# Patient Record
Sex: Female | Born: 1970 | Race: White | Hispanic: No | Marital: Married | Smoking: Never smoker
Health system: Southern US, Community
[De-identification: ages and names within clinical notes are randomized; demographics above are authoritative.]

## PROBLEM LIST (undated history)

## (undated) DIAGNOSIS — N926 Irregular menstruation, unspecified: Secondary | ICD-10-CM

## (undated) DIAGNOSIS — Z8719 Personal history of other diseases of the digestive system: Secondary | ICD-10-CM

## (undated) DIAGNOSIS — N200 Calculus of kidney: Secondary | ICD-10-CM

## (undated) DIAGNOSIS — T7840XA Allergy, unspecified, initial encounter: Secondary | ICD-10-CM

## (undated) DIAGNOSIS — E663 Overweight: Secondary | ICD-10-CM

## (undated) DIAGNOSIS — F329 Major depressive disorder, single episode, unspecified: Secondary | ICD-10-CM

## (undated) DIAGNOSIS — K469 Unspecified abdominal hernia without obstruction or gangrene: Secondary | ICD-10-CM

## (undated) DIAGNOSIS — J45909 Unspecified asthma, uncomplicated: Secondary | ICD-10-CM

## (undated) DIAGNOSIS — R5383 Other fatigue: Secondary | ICD-10-CM

## (undated) DIAGNOSIS — L0390 Cellulitis, unspecified: Secondary | ICD-10-CM

## (undated) DIAGNOSIS — N189 Chronic kidney disease, unspecified: Secondary | ICD-10-CM

## (undated) DIAGNOSIS — Z8616 Personal history of COVID-19: Secondary | ICD-10-CM

## (undated) DIAGNOSIS — M1990 Unspecified osteoarthritis, unspecified site: Secondary | ICD-10-CM

## (undated) DIAGNOSIS — D649 Anemia, unspecified: Secondary | ICD-10-CM

## (undated) DIAGNOSIS — N83209 Unspecified ovarian cyst, unspecified side: Secondary | ICD-10-CM

## (undated) DIAGNOSIS — M549 Dorsalgia, unspecified: Secondary | ICD-10-CM

## (undated) DIAGNOSIS — K429 Umbilical hernia without obstruction or gangrene: Secondary | ICD-10-CM

## (undated) DIAGNOSIS — G4700 Insomnia, unspecified: Secondary | ICD-10-CM

## (undated) DIAGNOSIS — F32A Depression, unspecified: Secondary | ICD-10-CM

## (undated) DIAGNOSIS — M6208 Separation of muscle (nontraumatic), other site: Secondary | ICD-10-CM

## (undated) DIAGNOSIS — G8929 Other chronic pain: Secondary | ICD-10-CM

## (undated) DIAGNOSIS — Z87442 Personal history of urinary calculi: Secondary | ICD-10-CM

## (undated) DIAGNOSIS — M25551 Pain in right hip: Secondary | ICD-10-CM

## (undated) DIAGNOSIS — N719 Inflammatory disease of uterus, unspecified: Secondary | ICD-10-CM

## (undated) DIAGNOSIS — R519 Headache, unspecified: Secondary | ICD-10-CM

## (undated) DIAGNOSIS — R51 Headache: Secondary | ICD-10-CM

## (undated) DIAGNOSIS — B379 Candidiasis, unspecified: Secondary | ICD-10-CM

## (undated) DIAGNOSIS — J45990 Exercise induced bronchospasm: Secondary | ICD-10-CM

## (undated) DIAGNOSIS — L309 Dermatitis, unspecified: Secondary | ICD-10-CM

## (undated) DIAGNOSIS — Z8619 Personal history of other infectious and parasitic diseases: Secondary | ICD-10-CM

## (undated) DIAGNOSIS — J189 Pneumonia, unspecified organism: Secondary | ICD-10-CM

## (undated) HISTORY — DX: Inflammatory disease of uterus, unspecified: N71.9

## (undated) HISTORY — DX: Calculus of kidney: N20.0

## (undated) HISTORY — DX: Overweight: E66.3

## (undated) HISTORY — DX: Unspecified abdominal hernia without obstruction or gangrene: K46.9

## (undated) HISTORY — DX: Pain in right hip: M25.551

## (undated) HISTORY — DX: Allergy, unspecified, initial encounter: T78.40XA

## (undated) HISTORY — DX: Irregular menstruation, unspecified: N92.6

## (undated) HISTORY — DX: Chronic kidney disease, unspecified: N18.9

## (undated) HISTORY — DX: Unspecified osteoarthritis, unspecified site: M19.90

## (undated) HISTORY — DX: Depression, unspecified: F32.A

## (undated) HISTORY — DX: Headache, unspecified: R51.9

## (undated) HISTORY — DX: Insomnia, unspecified: G47.00

## (undated) HISTORY — DX: Personal history of other infectious and parasitic diseases: Z86.19

## (undated) HISTORY — DX: Dorsalgia, unspecified: M54.9

## (undated) HISTORY — DX: Dermatitis, unspecified: L30.9

## (undated) HISTORY — DX: Anemia, unspecified: D64.9

## (undated) HISTORY — DX: Candidiasis, unspecified: B37.9

## (undated) HISTORY — DX: Pneumonia, unspecified organism: J18.9

## (undated) HISTORY — DX: Other fatigue: R53.83

## (undated) HISTORY — DX: Unspecified asthma, uncomplicated: J45.909

## (undated) HISTORY — PX: FOOT SURGERY: SHX648

## (undated) HISTORY — PX: HERNIA REPAIR: SHX51

## (undated) HISTORY — DX: Separation of muscle (nontraumatic), other site: M62.08

## (undated) HISTORY — DX: Other chronic pain: G89.29

## (undated) HISTORY — DX: Cellulitis, unspecified: L03.90

## (undated) HISTORY — DX: Unspecified ovarian cyst, unspecified side: N83.209

## (undated) HISTORY — DX: Headache: R51

## (undated) HISTORY — PX: WISDOM TOOTH EXTRACTION: SHX21

## (undated) HISTORY — PX: MEDIAL PARTIAL KNEE REPLACEMENT: SHX5965

---
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
## 2000-11-26 ENCOUNTER — Other Ambulatory Visit: Admission: RE | Admit: 2000-11-26 | Discharge: 2000-11-26 | Payer: Self-pay | Admitting: Obstetrics and Gynecology

## 2001-02-25 ENCOUNTER — Encounter: Payer: Self-pay | Admitting: Obstetrics and Gynecology

## 2001-02-25 ENCOUNTER — Ambulatory Visit (HOSPITAL_COMMUNITY): Admission: RE | Admit: 2001-02-25 | Discharge: 2001-02-25 | Payer: Self-pay | Admitting: Obstetrics and Gynecology

## 2001-03-28 ENCOUNTER — Inpatient Hospital Stay: Admission: AD | Admit: 2001-03-28 | Discharge: 2001-03-28 | Payer: Self-pay | Admitting: Obstetrics and Gynecology

## 2001-06-17 ENCOUNTER — Inpatient Hospital Stay (HOSPITAL_COMMUNITY): Admission: AD | Admit: 2001-06-17 | Discharge: 2001-06-20 | Payer: Self-pay | Admitting: Obstetrics and Gynecology

## 2001-06-17 ENCOUNTER — Inpatient Hospital Stay (HOSPITAL_COMMUNITY): Admission: AD | Admit: 2001-06-17 | Discharge: 2001-06-17 | Payer: Self-pay | Admitting: Obstetrics and Gynecology

## 2001-12-22 ENCOUNTER — Other Ambulatory Visit: Admission: RE | Admit: 2001-12-22 | Discharge: 2001-12-22 | Payer: Self-pay | Admitting: Obstetrics and Gynecology

## 2002-12-29 ENCOUNTER — Other Ambulatory Visit: Admission: RE | Admit: 2002-12-29 | Discharge: 2002-12-29 | Payer: Self-pay | Admitting: Obstetrics and Gynecology

## 2003-01-02 ENCOUNTER — Emergency Department (HOSPITAL_COMMUNITY): Admission: EM | Admit: 2003-01-02 | Discharge: 2003-01-02 | Payer: Self-pay | Admitting: Emergency Medicine

## 2003-01-03 ENCOUNTER — Ambulatory Visit (HOSPITAL_BASED_OUTPATIENT_CLINIC_OR_DEPARTMENT_OTHER): Admission: RE | Admit: 2003-01-03 | Discharge: 2003-01-03 | Payer: Self-pay | Admitting: Urology

## 2003-01-03 ENCOUNTER — Ambulatory Visit (HOSPITAL_COMMUNITY): Admission: RE | Admit: 2003-01-03 | Discharge: 2003-01-03 | Payer: Self-pay | Admitting: Urology

## 2003-10-19 ENCOUNTER — Inpatient Hospital Stay (HOSPITAL_COMMUNITY): Admission: AD | Admit: 2003-10-19 | Discharge: 2003-10-19 | Payer: Self-pay | Admitting: Obstetrics and Gynecology

## 2003-12-21 ENCOUNTER — Inpatient Hospital Stay (HOSPITAL_COMMUNITY): Admission: RE | Admit: 2003-12-21 | Discharge: 2003-12-24 | Payer: Self-pay | Admitting: Obstetrics and Gynecology

## 2004-01-04 ENCOUNTER — Ambulatory Visit (HOSPITAL_COMMUNITY): Admission: AD | Admit: 2004-01-04 | Discharge: 2004-01-04 | Payer: Self-pay | Admitting: Obstetrics and Gynecology

## 2004-03-25 ENCOUNTER — Other Ambulatory Visit: Admission: RE | Admit: 2004-03-25 | Discharge: 2004-03-25 | Payer: Self-pay | Admitting: Obstetrics and Gynecology

## 2005-08-26 ENCOUNTER — Other Ambulatory Visit: Admission: RE | Admit: 2005-08-26 | Discharge: 2005-08-26 | Payer: Self-pay | Admitting: Obstetrics and Gynecology

## 2005-09-30 ENCOUNTER — Ambulatory Visit: Payer: Self-pay | Admitting: Family Medicine

## 2005-12-24 ENCOUNTER — Inpatient Hospital Stay (HOSPITAL_COMMUNITY): Admission: AD | Admit: 2005-12-24 | Discharge: 2005-12-24 | Payer: Self-pay | Admitting: Obstetrics and Gynecology

## 2006-01-01 ENCOUNTER — Ambulatory Visit: Payer: Self-pay | Admitting: Family Medicine

## 2006-02-26 ENCOUNTER — Inpatient Hospital Stay (HOSPITAL_COMMUNITY): Admission: AD | Admit: 2006-02-26 | Discharge: 2006-03-01 | Payer: Self-pay | Admitting: Obstetrics and Gynecology

## 2008-03-26 ENCOUNTER — Inpatient Hospital Stay (HOSPITAL_COMMUNITY): Admission: AD | Admit: 2008-03-26 | Discharge: 2008-03-26 | Payer: Self-pay | Admitting: Obstetrics and Gynecology

## 2008-06-04 ENCOUNTER — Inpatient Hospital Stay (HOSPITAL_COMMUNITY): Admission: RE | Admit: 2008-06-04 | Discharge: 2008-06-07 | Payer: Self-pay | Admitting: Obstetrics and Gynecology

## 2008-09-05 ENCOUNTER — Emergency Department: Payer: Self-pay | Admitting: Emergency Medicine

## 2010-04-29 LAB — CBC
HCT: 29.9 % — ABNORMAL LOW (ref 36.0–46.0)
HCT: 33.9 % — ABNORMAL LOW (ref 36.0–46.0)
Hemoglobin: 10.4 g/dL — ABNORMAL LOW (ref 12.0–15.0)
Hemoglobin: 11.9 g/dL — ABNORMAL LOW (ref 12.0–15.0)
MCHC: 34.9 g/dL (ref 30.0–36.0)
MCHC: 35 g/dL (ref 30.0–36.0)
MCV: 89.4 fL (ref 78.0–100.0)
Platelets: 223 10*3/uL (ref 150–400)
RBC: 3.32 MIL/uL — ABNORMAL LOW (ref 3.87–5.11)
RBC: 3.8 MIL/uL — ABNORMAL LOW (ref 3.87–5.11)
RDW: 13.9 % (ref 11.5–15.5)
WBC: 11.4 10*3/uL — ABNORMAL HIGH (ref 4.0–10.5)

## 2010-04-29 LAB — URINALYSIS, ROUTINE W REFLEX MICROSCOPIC
Glucose, UA: NEGATIVE mg/dL
Hgb urine dipstick: NEGATIVE
Ketones, ur: NEGATIVE mg/dL
Protein, ur: NEGATIVE mg/dL
pH: 6 (ref 5.0–8.0)

## 2010-04-29 LAB — RH IMMUNE GLOB WKUP(>/=20WKS)(NOT WOMEN'S HOSP): Fetal Screen: NEGATIVE

## 2010-05-01 LAB — RH IMMUNE GLOBULIN WORKUP (NOT WOMEN'S HOSP): ABO/RH(D): A NEG

## 2010-06-03 NOTE — Discharge Summary (Signed)
NAMEJAZMA, [name] NO.:  [id number]   MEDICAL RECORD NO.:  [id number]          PATIENT TYPE:  INP   LOCATION:  9124                          [facility]  [facility]   PHYSICIAN:  Janine Limbo, M.D.DATE OF BIRTH:  Jul 08, 1970   DATE OF ADMISSION:  06/04/2008  DATE OF DISCHARGE:  06/07/2008                               DISCHARGE SUMMARY   [name] is a 40 year old, gravida 4, now para 4-0-0-4 who was  admitted for the delivery of her fourth child via her fourth cesarean.  Her daughter [name] was born on May 17, weighing 7 pounds 11 ounces with  Apgars of 9 at 1 minute and 9 at 5 minutes.   ADMISSION DIAGNOSES:  Single intrauterine pregnancy at 39.1 weeks'  gestation, prior cesarean x3, advanced maternal age, Rh negative,  PENICILLIN allergy,  history of tricuspid valve insufficiency, and  history of kidney stones.   DISCHARGE DIAGNOSES:  Stable 3 days status post cesarean #4, advanced  maternal age, anemia without symptoms, Rh negative, RhoGAM given,  PENICILLIN allergy, history of tricuspid valve insufficiency, and  history of kidney stones.   SIGNIFICANT LABORATORY FINDINGS:  [name] is A negative.  Her  rubella status is not in the chart.  WBC is 11.4 (11.4), HGB 10.4  (11.9), HCT 29.9 (33.9), PLT 179 (223).  She had negative UA on the  17th, negative RPR, and a negative hepatitis test.   Procedures; [name] had a spinal anesthesia and a repeat low  transverse cesarean section per Dr. Stefano Gaul.  She had a routine  postoperative care and RhoGAM was given on the 18th.  She progressed  very well.  Her discharge status was stable.  She was ambulating without  difficulty and passing flatus, breastfeeding was going well.  She  declined iron supplementation and birth control.  Her vital signs were  stable.  Her chest was clear.  Her breasts were soft.  Her nipples were  intact.  Her fundus was firm below the umbilicus with light lochia.  Her  incision was intact with minor dried blood.  Her extremities without  edema and normal DTRs, negative Homans sign x2.  She was provided with a  review of the discharge booklet, specifically danger signs of postpartum  and postoperative period.   DISCHARGE MEDICATIONS:  1. Prenatal vitamin.  2. Motrin 600 mg.  3. Darvocet  4. Stool softener.   She is to follow up in the office in 6 weeks with Dr. Stefano Gaul or sooner  p.r.n. problems.      Eulogio Bear, CNM      Janine Limbo, M.D.  Electronically Signed    JM/MEDQ  D:  06/07/2008  T:  06/08/2008  Job:  [id number]

## 2010-06-03 NOTE — H&P (Signed)
[name], [name] NO.:  [id number]   MEDICAL RECORD NO.:  [id number]         PATIENT TYPE:  WAMB   LOCATION:                                [facility]   PHYSICIAN:  Janine Limbo, M.D.DATE OF BIRTH:  1970-08-10   DATE OF ADMISSION:  06/04/2008  DATE OF DISCHARGE:                              HISTORY & PHYSICAL   HISTORY OF PRESENT ILLNESS:  Ms. [name] is a 40 year old female,  gravida 4, para 3-0-0-3, who presents at 83 weeks' gestation (EDC is Jun 10, 2008).  The patient has been followed at the [facility] and Gynecology Division of [facility] for women.  The patient's pregnancy has been complicated by the fact that her age is  greater than 35 years.  She was declined amniocentesis, but her first  trimester screen was within normal limits.  The patient has had 3 prior  cesarean sections and she desires a repeat cesarean section.  She has a  history of asthma, but has done very well during this pregnancy.   OBSTETRICAL HISTORY:  The patient had a cesarean section in 2003 for  breech presentation.  She had a repeat cesarean section in 2005 and a  repeat cesarean section in 2008.  All of those babies were born at full  term and have done well.   DRUG ALLERGIES:  The patient is allergic to penicillin and this causes a  rash.   PAST MEDICAL HISTORY:  The patient is quite healthy.  She does have a  history of asthma, but has done well during this pregnancy.  She has a  history of renal stones.   SOCIAL HISTORY:  The patient denies cigarette use, alcohol use, and  recreational drug use.   REVIEW OF SYSTEMS:  Normal pregnancy complaints.   FAMILY HISTORY:  Noncontributory.   PHYSICAL EXAMINATION:  VITAL SIGNS:  Weight is 190 pounds.  HEENT:  Within normal limits.  CHEST:  Clear.  HEART:  Regular rate and rhythm.  Her breasts are without masses.  ABDOMEN:  Gravid with fundal height of 37 cm.  EXTREMITIES:  Grossly  normal.  NEUROLOGIC:  Grossly normal.  PELVIC:  Cervix was closed along when last checked.   LABORATORY VALUES:  Blood type is A negative, antibody screen was  negative, HIV is nonreactive, HBsAg is negative, third trimester beta  strep is negative.   ASSESSMENT:  1. 39 weeks' gestation.  2. Three prior cesarean deliveries.  3. Desires repeat cesarean section.  4. Rh negative.   PLAN:  The patient will undergo her fourth cesarean delivery.  She does  not want a tubal ligation, but does say she does not care to have any  more children.  She is considering the Mirena intrauterine device.  We  discussed the small possibility about placenta previa and the fact that  there may be a need for more surgery than previously anticipated.  She  understands there is a possibility of excessive blood loss.      Janine Limbo, M.D.  Electronically Signed     AVS/MEDQ  D:  05/28/2008  T:  05/29/2008  Job:  [id number]

## 2010-06-03 NOTE — Op Note (Signed)
[name], [name] NO.:  [id number]   MEDICAL RECORD NO.:  [id number]          PATIENT TYPE:  INP   LOCATION:  9124                          [facility]   PHYSICIAN:  Janine Limbo, M.D.DATE OF BIRTH:  09-Sep-1970   DATE OF PROCEDURE:  06/04/2008  DATE OF DISCHARGE:                               OPERATIVE REPORT   PREOPERATIVE DIAGNOSES:  1. A 39 weeks' gestation.  2. Three prior cesarean sections.  3. Desires repeat cesarean section.   POSTOPERATIVE DIAGNOSES:  1. A 39 weeks' gestation.  2. Three prior cesarean sections.  3. Desires repeat cesarean section.   PROCEDURE:  Repeat low transverse cesarean section.   SURGEON:  Janine Limbo, MD   FIRST ASSISTANT:  Candice Denny Levy, CNM   ANESTHETIC:  Spinal.   DISPOSITION:  [name] is a 40 year old female, gravida 4, para 3-0-  0-3, who presents at [redacted] weeks gestation (EDC is Jun 10, 2008).  The  patient has been followed at the [facility] OB[facility] division of  [facility] for Women.  This pregnancy has been largely  uncomplicated.  Her age is greater than 35, and she had a negative first  trimester screen.  She declined amniocentesis.  The patient desires  repeat cesarean section.  She declines tubal ligation.  She understands  the indications for her surgical procedure, and she accepts the risks  of, but not limited to, anesthetic complications, bleeding, infections,  and possible damage to the surrounding organs.   FINDINGS:  A 7-pound 11-ounce female infant Aundra Millet) was delivered from a  cephalic presentation.  The Apgar scores were 9 at 1 minute and 9 at 5  minutes.  The uterus, fallopian tubes, and the ovaries appeared normal  for the gravid state.   PROCEDURE:  The patient was taken to the operating room where a spinal  anesthetic was given.  The patient's abdomen was prepped with multiple  layers of Betadine as was the perineum and the outer vagina.  A  Foley  catheter was placed in the bladder.  The patient was sterilely draped.  The lower abdomen was injected with 10 mL of 0.5% Marcaine with  epinephrine.  A low transverse incision was made in the abdomen through  the prior incision.  The incision was carried sharply through the  subcutaneous tissue, the fascia, and the anterior peritoneum.  An  incision was made in the lower uterine segment and extended in a low  transverse fashion.  The fetal head was delivered without difficulty.  The mouth and nose were suctioned.  The remainder of the infant was then  delivered.  The cord was clamped and cut, and the infant was handed to  the waiting pediatric team.  Routine cord blood studies were obtained.  The placenta was removed.  The uterine cavity was cleaned of amniotic  fluid, clotted blood, and membranes.  The uterine incision was closed  using a running locking suture of 2-0 Vicryl followed by an imbricating  suture of 2-0 Vicryl.  Hemostasis was achieved using figure-of-eight  sutures of 2-0 Vicryl.  The pelvis  was vigorously irrigated.  The  anterior peritoneum was closed using a running suture of 3-0 Vicryl.  The fascia was closed using a running suture of 0 Vicryl followed by 3  interrupted sutures of 0 Vicryl.  The subcutaneous layer was irrigated,  and hemostasis was noted to be adequate.  The subcutaneous layer was  closed using interrupted sutures of 2-0 Vicryl.  The skin was  reapproximated using a subcuticular suture of 3-0 Monocryl.  Sponge,  needle, and instrument counts were correct on 2 occasions.  The  estimated blood loss for the procedure was 600 mL.  The patient  tolerated her procedure well.  She was transported to the recovery room  in stable condition.  She was noted to drain clear yellow urine.  The  infant was taken to the full-term nursery in stable condition.  The  placenta was sent to Labor and Delivery.      Janine Limbo, M.D.  Electronically  Signed     AVS/MEDQ  D:  06/04/2008  T:  06/05/2008  Job:  [id number]

## 2010-06-06 NOTE — Op Note (Signed)
[name], [name] NO.:  [id number]   MEDICAL RECORD NO.:  [id number]                   PATIENT TYPE:   LOCATION:                                       FACILITY:   PHYSICIAN:  Excell Seltzer. Annabell Howells, M.D.                 DATE OF BIRTH:   DATE OF PROCEDURE:  01/03/2003  DATE OF DISCHARGE:                                 OPERATIVE REPORT   PROCEDURE:  Right ureteroscopic stone extraction.   PREOPERATIVE DIAGNOSIS:  Right UVDA stone.   POSTOPERATIVE DIAGNOSIS:  Right UVDA stone.   SURGEON:  Excell Seltzer. Annabell Howells, M.D.   ANESTHESIA:  General.   COMPLICATIONS:  None.   INDICATIONS:  [name] is a 40 year old white female who was sent from  the emergency room where the right distal ureteral stone.  She was  symptomatic at the time of her visit and elected to proceed with  ureteroscopic stone extraction that day.   DESCRIPTION OF PROCEDURE:  The patient was given p.o. Cipro.  She was taken  to the operating room where general anesthesia was induced.  She was placed  on the lithotomy position.  Perineum and genitalia were prepped with  Betadine solution and she was draped in the usual sterile fashion.  The  short 6-French ureteroscope was passed without difficulty per urethra.  The  right ureteral orifice was identified and cannulated with the ureteroscope.  Ureteroscope was used to dilate the orifice.  The stone was then removed  without difficulty.  The cystoscope was then inserted.  The bladder was  inspected and was unremarkable.  The bladder was drained.  The scope was  removed.  The patient was taken down from the lithotomy position, her  anesthetic was reversed.  She was removed to the recovery in stable  condition.  There were no complications.                                               Excell Seltzer. Annabell Howells, M.D.    JJW/MEDQ  D:  01/17/2003  T:  01/17/2003  Job:  [id number]

## 2010-06-06 NOTE — H&P (Signed)
[facility] of [facility]  Patient:    [name], [name] Visit Number: [id number] MRN: [id number]          Service Type: OBS Location: [facility] Attending Physician:  Shaune Spittle Dictated by:   Nigel Bridgeman, C.N.M. Admit Date:  06/17/2001                           History and Physical  HISTORY OF PRESENT ILLNESS:   The patient is a 40 year old gravida 1, para 0, at 40-2/7ths weeks, who presents with spontaneous rupture of the membranes at approximately 6 a.m., uterine contractions every 4-5 minutes.  The patient is scheduled for a C-section at 11 a.m. today secondary to breech.  The pregnancy has been remarkable for (1) breech presentation discovered at visit Jun 15, 2001, (2) history of asthma, (3) tricuspid insufficiency with patient using antibiotics with dental work; she had a normal 2-D echocardiogram this pregnancy, (4) family history of von Willebrands disease, (5) Rh negative, (6) history of asthma.  PRENATAL LABORATORIES:        Blood type is A negative, Rh antibody negative. VDRL nonreactive.  Rubella titer positive.  Hepatitis B surface antigen negative.  Pap was normal.  GC and Chlamydia cultures were negative.  Glucose challenge was normal.  AFP was declined.  Hemoglobin upon entry into practice was 13.7, it was 13.0 at 27 weeks.  Group B strep culture was negative at 36 weeks.  EDC of Jun 15, 2001, was established by ultrasound at 20 weeks, [facility] of June 4, by last menstrual period of 25-day cycles.  HISTORY OF PRESENT PREGNANCY:                    The patient entered care at approximately 10 weeks.  She had some nausea and vomiting in early pregnancy for which she was taking Phenergan.  She had a history of questionable hepatitis in the past.  Her hepatitis testing was negative.  She also had a history of questionable tricuspid leaky valve per an M.D.  She had a cardiology evaluation with Dr. Donnie Aho that showed a normal 2-D  echocardiogram.  She received RhoGAM at 28 weeks.  The rest of her pregnancy was essentially uncomplicated.  She had some emotional and psychological issues as she was preparing for labor, but no depression, etc.  At her 40-week visit she was determined to have a breech presentation.  Consultation was held with Dr. Estanislado Pandy and a decision was made to proceed with C-section today.  OBSTETRICAL HISTORY:          The patient is a primigravida.  MEDICAL HISTORY:              The patient has occasional yeast infections. She reports the usual childhood illnesses.  She had mononucleosis in 1991. She had two of the hepatitis B series.  She had a history of a questionable tricuspid valve insufficiency.  The patient was anemic as a child.  She has exercise-induced asthma for which she uses albuterol inhaler and Advair p.r.n. She had a foot injury, a pierce injury, in sixth grade and had surgery.  OTHER SURGICAL HISTORY:       Includes sixth-grade foot surgery, in 2002 she had one wisdom tooth removed and five years ago she had two wisdom teeth out.  ALLERGIES:                    She is allergic to  PENICILLIN which causes rashes and ERYTHROMYCIN which causes stomach pain.  FAMILY HISTORY:               Paternal grandmother and maternal grandfather, maternal father had heart attacks.  Her father and paternal grandmother had heart disease.  Paternal grandmother, maternal father have hypertension.  Her mother has varicose veins.  There is a questionable clotting disorder of the maternal grandfathers family of von Willebrands disease.  Maternal grandmother has hypothyroidism.  Paternal aunt has hypothyroidism.  The patients sister is hypothyroid after delivery.  The patients sister has some type of thyroid disease.  Maternal uncle had lung cancer.  Maternal grandmother had colon cancer.  Maternal grandfather had two strokes and TIAs.  GENETIC HISTORY:              Unremarkable.  SOCIAL HISTORY:                The patient is married to the father of the baby, he is involved and supportive, his name is Tamera Reason.  The patient is Caucasian and of the [employer].  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  Her husband is college educated and he is also employed at [employer].  She has been followed by the certified nurse midwife service at [facility].  She denies any alcohol, drug or tobacco use during this pregnancy.  PHYSICAL EXAMINATION:  VITAL SIGNS:                  Vital signs are stable, patient is afebrile.  HEENT:                        Within normal limits.  LUNGS:                        Bilateral breath sounds are clear.  HEART:                        Regular rate and rhythm without murmur.  BREASTS:                      Soft and nontender.  ABDOMEN:                      Fundal height is approximately 38 cm.  Estimated fetal weight 7-1/2 to 8 pounds.  Uterine contractions are every 2-4 minutes, mild quality.  PELVIC EXAM:                  Cervical exam is 1-2, thick, with a breech presentation at a -3 station.  There is a light meconium-stained fluid noted. Fetal heart rate is reactive with no decelerations.  EXTREMITIES:                  Deep tendon reflexes are 2+ without clonus. There is a trace edema noted.  IMPRESSION: 1. Intrauterine pregnancy at 40-2/7ths weeks. 2. Spontaneous rupture of the membranes, in early labor. 3. Breech presentation. 4. Rhesus negative. 5. Tricuspid insufficiency with normal two-dimensional echocardiogram. 6. Family history of von Willebrands disease.  PLAN: 1. Admit to the [facility] [place] with consult with Dr. Kirkland Hun as attending physician. 2. Routine physician preoperative orders. 3. Will proceed with C-section this morning. 4. Von Willebrands disease panel was drawn by lab in addition to other    routine orders.  Dictated by:   Nigel Bridgeman, C.N.M. Attending Physician:   Shaune Spittle DD:  06/17/01 TD:  06/17/01 Job: [id number]

## 2010-06-06 NOTE — Discharge Summary (Signed)
NAMERUKAYA, KLEINSCHMIDT NO.:  [id number]   MEDICAL RECORD NO.:  [id number]          PATIENT TYPE:  INP   LOCATION:  9146                          [facility]  [facility]   PHYSICIAN:  Hal Morales, M.D.DATE OF BIRTH:  Jun 23, 1970   DATE OF ADMISSION:  12/21/2003  DATE OF DISCHARGE:                                 DISCHARGE SUMMARY   ADMISSION DIAGNOSES:  1.  Intrauterine pregnancy at term.  2.  Previous cesarean section.  3.  Desires repeat.   DISCHARGE DIAGNOSES:  1.  Intrauterine pregnancy at term.  2.  Previous cesarean section.  3.  Desires repeat.  4.  Breast feeding.  5.  Undecided regarding contraception.   PROCEDURES THIS ADMISSION:  Repeat low transverse cesarean section for  delivery of a viable female infant named Lauris Poag who had Apgars of 9 and 9  and weighed 7 pounds 13 ounces on December 21, 2003.   HOSPITAL COURSE:  Mrs. Loudenslager is a 40 year old married black female  gravida 2 para 1-0-0-1 at term admitted for elective repeat cesarean  section.  She underwent the same under spinal anesthesia without difficulty  for delivery of a viable female infant named Lauris Poag who had Apgars of 9 and  9 and weighed 7 pounds 13 ounces on December 21, 2003 attended in delivery by  Dr. Leonard Schwartz and Vance Gather Duplantis, C.N.M.  Postoperatively,  the patient has done well.  She is ambulating, voiding, and eating without  difficulty.  Her vital signs are stable and she has been afebrile throughout  her postoperative course.  She is breastfeeding without difficulty.  She is  currently undecided regarding contraception.  She is deemed ready for  discharge today.   DISCHARGE INSTRUCTIONS:  As per the [facility] handout.   DISCHARGE MEDICATIONS:  1.  Motrin 600 mg p.o. q.6h. p.r.n. for pain.  2.  Tylox one to two p.o. q.4-6h. p.r.n. for pain.  3.  Prenatal vitamins daily.   DISCHARGE LABORATORY DATA:  Her hemoglobin is 10.1, wbc count is 11.7,  platelets are 210.   DISCHARGE FOLLOW-UP:  In 4-6 weeks at [facility] OB[facility] or p.r.n.   DISCHARGE STATUS:  Well and stable.     Shel   SJD/MEDQ  D:  12/24/2003  T:  12/24/2003  Job:  [id number]

## 2010-06-06 NOTE — Op Note (Signed)
NAMETYE, JUAREZ NO.:  [id number]   MEDICAL RECORD NO.:  [id number]          PATIENT TYPE:  INP   LOCATION:  9146                          [facility]   PHYSICIAN:  Janine Limbo, M.D.DATE OF BIRTH:  [birth year]   DATE OF PROCEDURE:  12/21/2003  DATE OF DISCHARGE:                                 OPERATIVE REPORT   PREOPERATIVE DIAGNOSES:  1.  Term intrauterine pregnancy.  2.  Prior cesarean section.  3.  Desires repeat cesarean section.   POSTOPERATIVE DIAGNOSES:  1.  Term intrauterine pregnancy.  2.  Prior cesarean section.  3.  Desires repeat cesarean section.   PROCEDURE:  Repeat low transverse cesarean section.   SURGEON:  Janine Limbo, M.D.   FIRST ASSISTANT:  Concha Pyo. Duplantis, C.N.M.   ANESTHESIA:  Spinal.   DISPOSITION:  Ms. Ernestene Mention is a 40 year old female, gravida 1, para 1-0-0-  1, who presents at term. She has been followed at the [facility] and Gynecology division of [facility] for Women for  this pregnancy that has been uncomplicated.  The patient understands the  indications for her procedure and she accepts the risk of, but not limited  to, anesthetic complications, bleeding, infections and possible damage to  the surrounding organs.   FINDINGS:  A 7 pound 13 ounce female infant (Ameilia) was delivered from a  cephalic presentation. The Apgar's were 9 at 1 minute and 9 at 5 minutes.  The uterus, fallopian tubes, and the ovaries were normal.   DESCRIPTION OF PROCEDURE:  The patient was taken to the operating room where  a spinal anesthetic was given. The patient's abdomen and perineum were  prepped with multiple layers of Betadine. A Foley catheter was placed in the  bladder. The patient was then sterilely draped. The lower abdomen was  injected with 20 mL of 0.5% Marcaine with epinephrine.  An incision was made  in the lower abdomen removing the prior scar.  The incision was then  extended through the subcutaneous tissue, the fascia, and the anterior  peritoneum.  The bladder flap was developed. An incision was made in the  lower uterine segment and was extended transversely. The fetal head was  delivered without difficulty. The mouth and nose were suctioned. The  remainder of the infant was delivered. The cord was clamped and cut and the  infant was handed to the awaiting pediatric's team.  Routine cord blood  studies were obtained. The placenta was then removed. The uterine cavity was  cleaned of amniotic fluid, clotted blood, and membranes. The uterine  incision was closed using a running locking suture of 2-0 Vicryl followed by  an imbricating suture of 2-0 Vicryl. Hemostasis was adequate. The pelvis was  vigorously irrigated. The anterior peritoneum and then the abdominal  musculature were reapproximated in the midline using 2-0 Vicryl. The fascia  was closed using a running suture of #0 Vicryl followed by three interrupted  sutures of #0 Vicryl. A Jackson-Pratt drain was placed in the subcutaneous  layer and brought out through the left lower quadrant. The subcutaneous  layer  was closed using #0 Vicryl. The skin was reapproximated using #0  Vicryl. The skin was reapproximated using a subcuticular suture of 4-0  Vicryl. Sponge, needle and instrument counts were correct on two occasions.  The estimated blood loss for the procedure was 800 mL.  The patient  tolerated the procedure well. She was taken to the recovery room in stable  condition. The infant was taken to the full term nursery in stable  condition.     Arth   AVS/MEDQ  D:  12/21/2003  T:  12/22/2003  Job:  [id number]

## 2010-06-06 NOTE — Discharge Summary (Signed)
NAMEMEREDITH, Ellison NO.:  [id number]   MEDICAL RECORD NO.:  [id number]          PATIENT TYPE:  INP   LOCATION:  9115                          FACILITY:  [facility]   PHYSICIAN:  Janine Limbo, M.D.DATE OF BIRTH:  05/04/1970   DATE OF ADMISSION:  02/26/2006  DATE OF DISCHARGE:                               DISCHARGE SUMMARY   ADMITTING DIAGNOSES:  1. Intrauterine pregnancy at 38 weeks.  2. Two previous cesarean sections.  3. Desires repeat.  4. PENICILLIN allergy.  5. Rh negative.   DISCHARGE DIAGNOSES:  1. Intrauterine pregnancy at 38 weeks.  2. Two previous cesarean sections.  3. Desires repeat.  4. PENICILLIN allergy.  5. Rh negative.   PROCEDURES:  Repeat low transverse cesarean section.   HOSPITAL COURSE:  [name] is a 40 year old gravida 3, para 2-0-0-2  who presented at 38 weeks for scheduled repeat cesarean section.  Her  pregnancy has been remarkable for:  1. Previous cesarean section x2.  2. Desire for repeat.  3. PENICILLIN allergy.  4. Rh negative.  She did receive RhoGAM during her pregnancy.   The patient was taken to the operating room where Dr. Stefano Gaul performed  a repeat low transverse cesarean section.  Findings were a viable  female, weight 6 pounds 12 ounces, Apgars were 8 and 9.  Infant was  taken to the full-term nursery.  Mother was taken to recovery in good  condition.  By postoperative day #1 the patient was doing well.  She was  up ad lib, voiding and tolerating a regular diet.  The infant was  breastfeeding.  Hemoglobin was 10.1, down from 12.  White blood cell  count was 11.8 and platelets were 197.  Comprehensive metabolic panel  was normal.  Urine sample was normal.  The patient did receive RhoGAM  post delivery.  The rest of her hospital course was uncomplicated.  By  postoperative day #3 the patient was doing well, her milk supply was  just beginning to come in.  The infant had lost approximately 11 ounces.  The  patient was deciding regarding contraception.  She had a JP drain  that was removed without difficulty.  Her incision was clean, dry, and  intact with subcuticular sutures noted.  Fundus was firm, lochia was  scant.  The rest of her physical exam was within normal limits.  She was  deemed to have received the full benefit of her hospital stay and was  discharged home.   DISCHARGE CONDITION:  Stable.   DISCHARGE INSTRUCTIONS:  Per [facility] handout.   DISCHARGE MEDICATIONS:  1. Motrin 600 mg p.o. q.6h. p.r.n. pain.  2. Percocet one to two p.o. q.3-4h. p.r.n. pain.  3. Prenatal vitamin one p.o. daily.   Discharge followup will occur in 6 weeks at [facility].      [name], C.N.M.      Janine Limbo, M.D.  Electronically Signed    VLL/MEDQ  D:  03/01/2006  T:  03/01/2006  Job:  [id number]

## 2010-06-06 NOTE — Op Note (Signed)
NAMERAKHI, ROMAGNOLI NO.:  [id number]   MEDICAL RECORD NO.:  [id number]          PATIENT TYPE:  INP   LOCATION:  9115                          [facility]  [facility]   PHYSICIAN:  Janine Limbo, M.D.DATE OF BIRTH:  Apr 14, 1970   DATE OF PROCEDURE:  02/26/2006  DATE OF DISCHARGE:                               OPERATIVE REPORT   PREOPERATIVE DIAGNOSIS:  1. Term intrauterine gestation.  2. Two prior cesarean sections.   POSTOPERATIVE DIAGNOSIS:  1. Term intrauterine gestation.  2. Two prior cesarean sections.   PROCEDURE:  Repeat low transverse cesarean section and placement of a  Jackson-Pratt drain.   SURGEON:  Janine Limbo, M.D.   FIRST ASSISTANT:  Nigel Bridgeman, CNM.   ANESTHESIA:  Spinal.   DISPOSITION:  Ms. Delafuente is a 40 year old female, gravida 3, para 2-0-  0-2. who presents at [redacted] weeks gestation [facility] is March 12, 2006).  The  patient has been followed at the [facility]N division of  [facility] women.  This pregnancy has been uncomplicated.  She had two prior cesarean sections and she desires a repeat cesarean  section.  She understands the indications for her surgical procedure and  she accepts the risk of, but not limited to, anesthetic complications,  bleeding, infection, and possible damage to surrounding organs.   FINDINGS:  A 6 pounds 12 ounces female infant Child psychotherapist) was delivered  from a vertex presentation.  The Apgars were 8 at one minute and 9 at  five minutes.  The uterus, fallopian tubes, and the ovaries appeared  normal for the gravid state.   PROCEDURE:  The patient was taken to the operating room where spinal  anesthetic was given.  The patient's abdomen, perineum, and vagina were  prepped with multiple layers of Betadine.  A Foley catheter was placed  in the bladder.  The patient was sterilely draped.  The lower abdomen  was injected with 18 mL of 0.5% Marcaine with epinephrine.  A low  transverse incision was made in the abdomen removing the prior cesarean  section scar.  The incision was then extended through the subcutaneous  tissue, fascia, and the anterior peritoneum.  The bladder flap was  developed.  An incision was made in the lower uterine segment and  extended in a low transverse fashion.  The fetal head was delivered  without difficulty.  The mouth and nose were suctioned.  The remainder  of the infant was then delivered.  The cord was clamped and cut and the  infant was handed to the waiting pediatric team.  Routine cord blood  studies were obtained.  The placenta was removed.  The uterine cavity  was cleaned of amniotic fluid, clotted blood, and membranes.  Uterine  incision was closed using a running locking suture of 2-0 Vicryl  followed by an imbricating suture of 2-0 Vicryl.  Figure-of-eight  sutures of 2-0 Vicryl were used for hemostasis.  Hemostasis was  adequate.  The abdominal cavity was then vigorously irrigated.  The  anterior peritoneum and abdominal musculature were reapproximated in the  midline using 0 Vicryl.  The fascia was closed using a running suture of  0 Vicryl followed by three interrupted sutures of 0 Vicryl.  A Al Pimple drain was placed in the subcutaneous space and brought out through  the left lower quadrant.  It was sutured into place using 3-0 silk.  The  subcutaneous layer was closed using a running suture of 0 Vicryl.  The  skin was reapproximated using a subcuticular suture of 3-0 Monocryl.  Sponge, needle, instrument counts were correct on two occasions.  The  estimated blood loss was 800 mL.  The patient tolerated her procedure  well.  She was noted to drain clear yellow urine at the end of her  procedure.  The patient was taken to the recovery room in stable  condition.  The infant was taken to the full-term nursery in stable  condition.  The placenta was sent to labor and delivery.      Janine Limbo, M.D.   Electronically Signed     AVS/MEDQ  D:  02/26/2006  T:  02/26/2006  Job:  [id number]

## 2010-06-06 NOTE — Op Note (Signed)
[facility]  Patient:    [name], [name] Visit Number: [id number] MRN: [id number]          Service Type: OBS Location: 910A 9114 01 Attending Physician:  Esmeralda Arthur Dictated by:   Janine Limbo, M.D. Proc. Date: 06/17/01 Admit Date:  06/17/2001                             Operative Report  PREOPERATIVE DIAGNOSES:       1. Term intrauterine pregnancy.                               2. Early labor.                               3. Breech presentation.  POSTOPERATIVE DIAGNOSES:      1. Term intrauterine pregnancy.                               2. Early labor.                               3. Homero Fellers breech presentation.  PROCEDURE:                    Primary low transverse cesarean section.  SURGEON:                      Janine Limbo, M.D.  FIRST ASSISTANT:              Nigel Bridgeman, C.N.M.  ANESTHESIA:                   Spinal.  INDICATIONS:                  The patient is a 40 year old female, gravida 1, para 0 who presents at term.  She has a known breech infant.  She ruptured her membranes this morning and is in early labor.  She understand the indications for her procedure and she accepts the risks of (but not limited to) anesthetic complications, bleeding, infection, and possible damage to surrounding organs.  FINDINGS:                     A 7 pound 6 ounce female infant (Annalise) was delivered from the frank breech presentation.  Apgars were 8 at one minute and 9 at five minutes.  The uterus, fallopian tubes and the ovaries were normal for the gravid state.  DESCRIPTION OF PROCEDURE:     The patient was taken to the operating room, where a spinal anesthetic was given.  The patients abdomen was prepped with multiple layers of Betadine, as was the perineum.  A Foley catheter was placed in the bladder.  The patient was sterilely draped.  A low transverse incision was made in the abdomen and carried sharply through the  subcutaneous tissue, the fascia and the anterior peritoneum.  An incision was made in the lower uterine segment and extended transversely.  The infant was delivered from a frank breech presentation without difficulty.  The mouth and nose were suctioned using DeLee trap.  The cord was clamped and cut and the infant was  handed to the awaiting pediatric team.  Routine cord blood studies were obtained.  The placenta was removed.  The uterine cavity was cleaned of amniotic fluid, clotted blood and membranes.  The uterine incision was closed using a running locking suture of 2-0 Vicryl.  Figure-of-eight sutures of 2-0 Vicryl were used for hemostasis.  The peritoneal cavity was vigorously irrigated.  The anterior peritoneum and the abdominal musculature were reapproximated in the midline using 2-0 Vicryl.  The subcutaneous layer and the fascia were irrigated.  Hemostasis was adequate.  The fascia was closed using a running suture of 0 Vicryl followed by three interrupted sutures of 0 Vicryl.  The subcutaneous layer was closed using running suture of 2-0 Vicryl.  Hemostasis was adequate.  The skin was reapproximated using skin staples. Sponge, needle and instrument counts were correct on two occasions.  Estimated blood loss for the procedure was 700 cc.  The patient tolerated the procedure well.  She was taken to the recovery room in stable condition.  The infant was taken to the full term nursery in stable condition. Dictated by:   Janine Limbo, M.D. Attending Physician:  Esmeralda Arthur DD:  06/17/01 TD:  06/18/01 Job: [id number]

## 2010-06-06 NOTE — H&P (Signed)
NAMEANGE, PUSKAS NO.:  [id number]   MEDICAL RECORD NO.:  [id number]          PATIENT TYPE:  INP   LOCATION:  NA                            [facility]  [facility]   PHYSICIAN:  Janine Limbo, M.D.DATE OF BIRTH:  06/23/1970   DATE OF ADMISSION:  02/26/2006  DATE OF DISCHARGE:                              HISTORY & PHYSICAL   HISTORY OF PRESENT ILLNESS:  Ms. Schlottman is a 40 year old female, G3,  P2-0-0-2, who presents at 30 weeks' gestation ([facility] is March 12, 2006).  The patient has been followed at the [facility]  Division of [facility].  The patient has had two  cesarean sections and she plans a repeat cesarean section.  She does not  desire tubal ligation.  This pregnancy has been largely uncomplicated.  The patient is Rh negative and she did receive RhoGAM at [redacted] weeks  gestation.  The patient has an age greater than 35 at the time of  delivery.  She declined amniocentesis.  She did have a first trimester  nuchal translucency test that was within normal limits.  The patient has  mononucleosis induced inflammation of the liver, but has done well.  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  She has a  history of tricuspid valve insufficiency.  She has had a 2-D  echocardiogram which showed normal tricuspid valve function, however.   PAST MEDICAL HISTORY:  In addition to the above, the patient had a foot  injury at age 16.  She had her wisdom teeth removed in 2002.   ALLERGIES:  PENICILLIN AND ERYTHROMYCIN.   PAST OBSTETRICAL HISTORY:  In 2003, the patient had a cesarean section  because of a breech infant at 40 weeks' gestation.  She had a repeat  cesarean section in 2005.   SOCIAL HISTORY:  The patient denies cigarette use, alcohol use and  recreational drug use.   REVIEW OF SYSTEMS:  Normal pregnancy complaints.   FAMILY HISTORY:  Noncontributory.   PHYSICAL EXAMINATION:  VITAL SIGNS:  Weight  is 181 pounds.  HEENT:  Within normal limits.  CHEST:  Chest is clear.  HEART:  Regular rate and rhythm.  BREASTS:  Her breasts are without masses.  ABDOMEN:  Her abdomen is gravid with a fundal height of 36 cm.  EXTREMITIES:  Her extremities are within normal limits.  NEUROLOGIC:  Grossly normal.  PELVIC:  Cervix was closed and long when last checked.   LABORATORY DATA AND X-RAY FINDINGS:  Blood type is A negative, antibody  screen is negative, rubella is positive, VDRL nonreactive.  HBsAg  negative.   ASSESSMENT:  1. Intrauterine pregnancy at 79 weeks' gestation.  2. Two prior cesarean sections.  3. Desires repeat cesarean section.  4. Penicillin allergy.  5. Rh negative.   PLAN:  The patient will undergo a repeat cesarean section.  She  understands the indications for her surgical procedure and she accepts  the risks of, but not limited to, anesthetic complications, bleeding,  infection and possible damage to the surrounding organs.  The patient  does not desire tubal ligation.      Janine Limbo, M.D.  Electronically Signed     AVS/MEDQ  D:  02/19/2006  T:  02/19/2006  Job:  [id number]

## 2010-06-06 NOTE — Discharge Summary (Signed)
[facility]  Patient:    [name], [name] Visit Number: [id number] MRN: [id number]          Service Type: OBS Location: 910A 9114 01 Attending Physician:  Esmeralda Arthur Dictated by:   Wynelle Bourgeois, CNM Admit Date:  06/17/2001 Discharge Date: 06/20/2001                             Discharge Summary  ADMISSION DIAGNOSES:          1. Intrauterine pregnancy at 40-2/7 weeks.                               2. Homero Fellers breech presentation.                               3. Early labor.                               4. Spontaneous rupture of membranes.  DISCHARGE DIAGNOSES:          1. Intrauterine pregnancy at 40-2/7 weeks.                               2. Homero Fellers breech presentation.                               3. Early labor.                               4. Spontaneous rupture of membranes.                               5. Status post primary low transverse cesarean                                  section, delivered of a viable female infant                                  named Annalise weighing 7 pounds 6 ounces,                                  Apgars 8 and 9.  PROCEDURES:                   1. Spinal anesthesia.                               2. Primary low transverse cesarean section by                                  Dr. Stefano Gaul.  HOSPITAL COURSE:              The patient was admitted on Jun 17, 2001 after having been scheduled  for a C-section due to frank breech presentation; however, earlier in the morning of that day she ruptured her membranes and began having contractions and proceeded to be in early labor.  Breech presentation was confirmed and decision was made to proceed with elective primary low transverse cesarean section by Dr. Stefano Gaul with Nigel Bridgeman assisting.  EBL was 700 cc.  The patient was taken to recovery, where she did well.  On postoperative day #1, her vital signs were stable.  Chest was clear. Heart rate was  regular rate and rhythm.  The incision was dry and intact and lochia was small.  On postoperative day #2 and #3, she improved.  Her vital signs were stable.  She remained afebrile.  Physical exam within normal limits.  Incision was clean and dry with staples.  Abdomen was soft and appropriately tender, lochia small, extremities within normal limits, and she was deemed to have received the full benefit of her hospital stay and was discharged home on postoperative day #3.  DISCHARGE LABORATORY DATA:    WBC 12.4, hemoglobin 9.2, hematocrit 26.9, platelets 183.  Her RPR was nonreactive.  Chemistries were within normal limits.  DISCHARGE MEDICATIONS:        1. Motrin 600 mg p.o. q.6h. p.r.n.                               2. Tylox 1-2 p.o. q.4h. p.r.n.                               3. Prenatal vitamin 1 p.o. q.d.  DISCHARGE INSTRUCTIONS:       Per [facility] handout.  FOLLOWUP:                     Followup in six weeks or p.r.n. at [facility]. Dictated by:   Wynelle Bourgeois, CNM Attending Physician:  Esmeralda Arthur DD:  06/20/01 TD:  06/21/01 Job: [id number]

## 2010-06-06 NOTE — H&P (Signed)
[name], [name] NO.:  [id number]   MEDICAL RECORD NO.:  [id number]          PATIENT TYPE:  INP   LOCATION:  NA                            [facility]  [facility]   PHYSICIAN:  Janine Limbo, M.D.DATE OF BIRTH:  Sep 07, 1970   DATE OF ADMISSION:  12/21/2003  DATE OF DISCHARGE:                                HISTORY & PHYSICAL   HISTORY OF PRESENT ILLNESS:  Ms. [name] is a 40 year old female, gravida  2, para 1-0-0-1, who presents for a repeat cesarean section.  She has been  followed at the [facility] division of  [facility].  This pregnancy has been largely  uncomplicated.   PAST MEDICAL HISTORY:  The patient had a primary cesarean section in May of  2003 because of a breech presentation.  She delivered a female infant that  weighed 7 pound 6 ounces at term.  The patient denies hypertension and  diabetes.  She has exercised induced asthma.  The patient had a right kidney  stone removed in December of 2004.  The patient has a history of tricuspid  valve insufficiency.  She was diagnosed with mononucleosis induced hepatic  inflammation.   ALLERGIES:  PENICILLIN causes a rash.  ERYTHROMYCIN causes stomach pain.   SOCIAL HISTORY:  The patient is married and she works as a Health and safety inspector. She  denies cigarettes use, alcohol use, and recreational drug use.   REVIEW OF SYMPTOMS:  Normal pregnancy complaints.   FAMILY HISTORY:  Noncontributory.   PHYSICAL EXAMINATION:  VITAL SIGNS:  Weight is 187 pounds.  HEENT:  Within normal limits.  CHEST:  Clear.  HEART:  Regular rate and rhythm.  BREASTS:  Without masses.  ABDOMEN:  Gravid and fundal height is 38 cm.  EXTREMITIES:  Grossly normal.  NEUROLOGICAL:  Grossly normal.  PELVIC:  Cervix was closed and long when last tested.   LABORATORY DATA:  Blood type is A negative.  Antibody screen negative.  VDRL  nonreactive.  Rubella positive.  HBSAG negative.  Third  trimester beta strep  is negative.   ASSESSMENT:  1.  Term pregnancy:  Estimated date of confinement is January 03, 2004.  2.  Prior cesarean section.  3.  Desires repeat cesarean section.   PLAN:  The patient will undergo a repeat low transverse cesarean section.  She understands the indication for her procedure and the associated risk.     Arth   AVS/MEDQ  D:  12/20/2003  T:  12/20/2003  Job:  [id number]

## 2010-11-15 ENCOUNTER — Emergency Department: Payer: Self-pay | Admitting: Unknown Physician Specialty

## 2011-07-06 ENCOUNTER — Ambulatory Visit: Payer: Self-pay | Admitting: Obstetrics and Gynecology

## 2011-09-28 ENCOUNTER — Ambulatory Visit: Payer: Self-pay | Admitting: Obstetrics and Gynecology

## 2011-10-07 ENCOUNTER — Ambulatory Visit: Payer: Self-pay | Admitting: Obstetrics and Gynecology

## 2011-10-09 ENCOUNTER — Ambulatory Visit: Payer: Self-pay | Admitting: Obstetrics and Gynecology

## 2011-10-14 ENCOUNTER — Ambulatory Visit (INDEPENDENT_AMBULATORY_CARE_PROVIDER_SITE_OTHER): Payer: Self-pay | Admitting: Obstetrics and Gynecology

## 2011-10-14 DIAGNOSIS — Z01419 Encounter for gynecological examination (general) (routine) without abnormal findings: Secondary | ICD-10-CM

## 2011-10-14 NOTE — Progress Notes (Deleted)
Subjective:    [name] is a 42 y.o. female, [phone], who presents for an annual exam.   Patient reports:  ***.    History   Social History  . Marital Status: Married    Spouse Name: N/A    Number of Children: N/A  . Years of Education: N/A   Social History Main Topics  . Smoking status: Not on file  . Smokeless tobacco: Not on file  . Alcohol Use: Not on file  . Drug Use: Not on file  . Sexually Active: Not on file   Other Topics Concern  . Not on file   Social History Narrative  . No narrative on file    Menstrual cycle:   LMP: No LMP recorded.           Cycle: ***  The following portions of the patient's history were reviewed and updated as appropriate: allergies, current medications, past family history, past medical history, past social history, past surgical history and problem list.  Review of Systems Pertinent items are noted in HPI. Breast:Negative for breast lump,nipple discharge or nipple retraction Gastrointestinal: Negative for abdominal pain, change in bowel habits or rectal bleeding Urinary:negative   Objective:    There were no vitals taken for this visit.    Weight:  Wt Readings from Last 1 Encounters:  No data found for Wt          BMI: There is no height or weight on file to calculate BMI.  General Appearance: Alert, appropriate appearance for age. No acute distress HEENT: Grossly normal Neck / Thyroid: Supple, no masses, nodes or enlargement Lungs: clear to auscultation bilaterally Back: No CVA tenderness Breast Exam: {exam; breast:30839::"No masses or nodes.No dimpling, nipple retraction or discharge."} Cardiovascular: Regular rate and rhythm. S1, S2, no murmur Gastrointestinal: Soft, non-tender, no masses or organomegaly Pelvic Exam: {Exam; pelvic:30843} Rectovaginal: {rectal female:311646::"not indicated"} Lymphatic Exam: Non-palpable nodes in neck, clavicular, axillary, or inguinal regions Skin: no rash or  abnormalities Neurologic: Normal gait and speech, no tremor  Psychiatric: Alert and oriented, appropriate affect.   Wet Prep:{pe wet prep/koh:313109::"not applicable"} Urinalysis:{Urine dipstick components:5375::"not applicable"} UPT: {FINDINGS; POSITIVE NEGATIVE:[phone]::"Not done"}   Assessment:    {diagnoses; exam gyn:13148}    Plan:    Mammogram: *** Pap:  *** STD screening: {DESC; DONE/DISCUSSED/DECLINED:14570} Contraception:{PLAN CONTRACEPTION:313102} Other:  ***      Nathan Stallworth, [place], [place]

## 2011-10-15 NOTE — Progress Notes (Signed)
[facility] 10/14/11

## 2011-11-20 LAB — HM PAP SMEAR: HM Pap smear: NORMAL

## 2012-02-22 ENCOUNTER — Encounter: Payer: Self-pay | Admitting: Obstetrics and Gynecology

## 2012-02-22 ENCOUNTER — Ambulatory Visit: Payer: BC Managed Care – PPO | Admitting: Obstetrics and Gynecology

## 2012-02-22 VITALS — BP 110/68 | Wt 160.0 lb

## 2012-02-22 DIAGNOSIS — Z889 Allergy status to unspecified drugs, medicaments and biological substances status: Secondary | ICD-10-CM

## 2012-02-22 DIAGNOSIS — Z87442 Personal history of urinary calculi: Secondary | ICD-10-CM

## 2012-02-22 DIAGNOSIS — O34219 Maternal care for unspecified type scar from previous cesarean delivery: Secondary | ICD-10-CM | POA: Insufficient documentation

## 2012-02-22 DIAGNOSIS — Z124 Encounter for screening for malignant neoplasm of cervix: Secondary | ICD-10-CM

## 2012-02-22 DIAGNOSIS — Z6791 Unspecified blood type, Rh negative: Secondary | ICD-10-CM

## 2012-02-22 NOTE — Progress Notes (Signed)
Subjective:    [name] is a 42 y.o. female, [phone], who presents for an annual exam.   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  In orthopedic and chiropractic care.  Getting better. No gyn issues.    History   Social History  . Marital Status: Married    Spouse Name: N/A    Number of Children: N/A  . Years of Education: N/A   Social History Main Topics  . Smoking status: Not on file  . Smokeless tobacco: Not on file  . Alcohol Use: Not on file  . Drug Use: Not on file  . Sexually Active: Not on file   Other Topics Concern  . Not on file   Social History Narrative  . No narrative on file    Menstrual cycle:   LMP: No LMP recorded.           Cycle: WNL  The following portions of the patient's history were reviewed and updated as appropriate: allergies, current medications, past family history, past medical history, past social history, past surgical history and problem list.  Review of Systems Pertinent items are noted in HPI. Breast:Negative for breast lump,nipple discharge or nipple retraction Gastrointestinal: Negative for abdominal pain, change in bowel habits or rectal bleeding Urinary:negative   Objective:    There were no vitals taken for this visit.    Weight:  Wt Readings from Last 1 Encounters:  No data found for Wt          BMI: There is no height or weight on file to calculate BMI.  General Appearance: Alert, appropriate appearance for age. No acute distress HEENT: Grossly normal Neck / Thyroid: Supple, no masses, nodes or enlargement Lungs: clear to auscultation bilaterally Back: No CVA tenderness Breast Exam: No masses or nodes.No dimpling, nipple retraction or discharge. Cardiovascular: Regular rate and rhythm. S1, S2, no murmur Gastrointestinal: Soft, non-tender, no masses or organomegaly Pelvic Exam: Vulva and vagina appear normal. Bimanual exam reveals normal uterus and  adnexa. Rectovaginal: normal rectal, no masses Lymphatic Exam: Non-palpable nodes in neck, clavicular, axillary, or inguinal regions Skin: no rash or abnormalities Neurologic: Normal gait and speech, no tremor  Psychiatric: Alert and oriented, appropriate affect.   Wet Prep:not applicable Urinalysis:not applicable UPT: Not done   Assessment:    Normal gyn exam  Back issues   Plan:    Mammogram: Schedule this year--we will schedule Pap:  Done today STD screening: declined Contraception:natural family planning (NFP) Other:  Continue to f/u with orthopedist and chiropractor.  Support offered for issues.      Nyra Capes, [place]

## 2012-02-22 NOTE — Progress Notes (Signed)
The patient reports:no complaints  Contraception:none  Last mammogram: never Last pap: normal January 15, 2011  GC/Chlamydia cultures offered: declined HIV/RPR/HbsAg offered:  declined HSV 1 and 2 glycoprotein offered: declined  Menstrual cycle regular and monthly: Yes Menstrual flow normal: Yes  Urinary symptoms: none Normal bowel movements: Yes Reports abuse at home: No

## 2012-02-24 LAB — PAP IG W/ RFLX HPV ASCU

## 2012-03-02 ENCOUNTER — Ambulatory Visit: Payer: Self-pay | Admitting: Unknown Physician Specialty

## 2012-06-09 LAB — HM MAMMOGRAPHY: HM MAMMO: NORMAL

## 2012-08-22 ENCOUNTER — Encounter: Payer: Self-pay | Admitting: General Surgery

## 2012-09-06 ENCOUNTER — Ambulatory Visit (INDEPENDENT_AMBULATORY_CARE_PROVIDER_SITE_OTHER): Payer: BC Managed Care – PPO | Admitting: General Surgery

## 2012-09-06 ENCOUNTER — Encounter: Payer: Self-pay | Admitting: General Surgery

## 2012-09-06 VITALS — BP 116/68 | HR 74 | Resp 12 | Ht 62.0 in | Wt 162.0 lb

## 2012-09-06 DIAGNOSIS — K429 Umbilical hernia without obstruction or gangrene: Secondary | ICD-10-CM

## 2012-09-06 NOTE — Patient Instructions (Addendum)
The patient advised of her option for surgery. Patient to think about it and let us know.

## 2012-09-06 NOTE — Progress Notes (Signed)
Patient ID: [name], female   DOB: February 03, 1970, 42 y.o.   MRN: [id number]  Chief Complaint  Patient presents with  . Other    New Patient umbilical hernia    HPI [name] is a 42 y.o. female who presents for an evaluation of an umbilical hernia. The patient states she noticed the hernia approximately the last 4 years. It has been getting more sensitive to touch within the last 3 months. She has occasional sharpe pain in the umbilical area. The patient has had 4 c-sections with the last being in 2010.  The patient reports that she sensitive with direct pressure at the site of the umbilical hernia. She's also reports some occasional stinging sensation in the right midabdomen, pointing to the area of the semi-lunar line of Douglas to the right of the midline. She's never appreciated a bulge in this area.  She reports after her last C-section 2010 there was some concern for possible problem one month out from her surgery and she had a CT scan, but this could not be located in her Epic records.   HPI  Past Medical History  Diagnosis Date  . Kidney stones   . Hernia   . Endometritis   . Irregular menses   . Cellulitis   . Yeast infection   . Ovarian cyst   . History of chicken pox   . Rectus diastasis   . Back pain     bulging disc  . Asthma     exercise induced  . Arthritis     Past Surgical History  Procedure Laterality Date  . Wisdom tooth extraction    . Foot surgery    . Cesarean section  2003,2005,2008,2010    Family History  Problem Relation Age of Onset  . Cancer Mother 47    breast cancer    Social History History  Substance Use Topics  . Smoking status: Never Smoker   . Smokeless tobacco: Never Used  . Alcohol Use: No    Allergies  Allergen Reactions  . Erythromycin Other (See Comments)    Severe abdominal pain  . Penicillins Hives    Current Outpatient Prescriptions  Medication Sig Dispense Refill  . nortriptyline (PAMELOR) 10 MG  capsule Take 1 capsule by mouth daily.       No current facility-administered medications for this visit.    Review of Systems Review of Systems  Constitutional: Negative.   Respiratory: Negative.   Cardiovascular: Negative.   Gastrointestinal: Positive for abdominal pain.    Blood pressure 116/68, pulse 74, resp. rate 12, height 5\' 2"  (1.575 m), weight 162 lb (73.483 kg), last menstrual period 08/22/2012.  Physical Exam Physical Exam  Constitutional: She appears well-developed and well-nourished.  Neck: No thyromegaly present.  Cardiovascular: Normal rate, regular rhythm and normal heart sounds.   No murmur heard. Pulmonary/Chest: Effort normal and breath sounds normal.  Abdominal: Soft. Normal appearance and bowel sounds are normal.  5 mm umbilical defect.  Lymphadenopathy:    She has no cervical adenopathy.   No abdominal wall masses are identified. No focal tenderness at the semi-lunar line of Douglas on the right side.  Data Reviewed None  Assessment    Umbilical hernia, minimally symptomatic at present. Pain in the area of a spigelian hernia without defect on clinical exam.    Plan    At this time the patient's not committed to the idea surgical repair. If she does like repair, she would be a good candidate  for a diagnostic laparoscopy at the same setting to assess the anterior abdominal wall sure where she has appreciated tenderness to the right of the midline.    Patient will notifiy the office how she would like to proceed.   [name] 09/07/2012, 5:49 PM

## 2012-09-07 ENCOUNTER — Encounter: Payer: Self-pay | Admitting: General Surgery

## 2013-08-23 ENCOUNTER — Ambulatory Visit: Payer: Self-pay | Admitting: Family Medicine

## 2013-11-20 ENCOUNTER — Encounter: Payer: Self-pay | Admitting: General Surgery

## 2014-04-10 ENCOUNTER — Ambulatory Visit (INDEPENDENT_AMBULATORY_CARE_PROVIDER_SITE_OTHER): Payer: BLUE CROSS/BLUE SHIELD | Admitting: General Surgery

## 2014-04-10 ENCOUNTER — Encounter: Payer: Self-pay | Admitting: General Surgery

## 2014-04-10 VITALS — BP 120/74 | HR 78 | Resp 14 | Ht 62.0 in | Wt 160.0 lb

## 2014-04-10 DIAGNOSIS — K429 Umbilical hernia without obstruction or gangrene: Secondary | ICD-10-CM

## 2014-04-10 NOTE — Progress Notes (Signed)
Patient ID: [name], female   DOB: 08/27/1970, 44 y.o.   MRN: [id number]  Chief Complaint  Patient presents with  . Hernia  . Umbilical Hernia    HPI [name] is a 44 y.o. female The patient states she noticed the umbilical hernia approximately 6 years ago. It has been getting more sensitive to touch within the last 3 months. It had gotten better since her last visit in 2014. She also has noticed that when she coughs or sneezes that her right groin feels "funny" with "stinging". She does not actually see a knot its just tender. She states she is getting over pneumonia. She states she feels abdominal "fullness" or "distended". Her weight is stable.  Four children 12, 4510, 618 and 44 year old all girls.  HPI  Past Medical History  Diagnosis Date  . Kidney stones   . Hernia   . Endometritis   . Irregular menses   . Cellulitis   . Yeast infection   . Ovarian cyst   . History of chicken pox   . Rectus diastasis   . Back pain     bulging disc  . Asthma     exercise induced  . Arthritis     Past Surgical History  Procedure Laterality Date  . Wisdom tooth extraction    . Foot surgery    . Cesarean section  2003,2005,2008,2010    Family History  Problem Relation Age of Onset  . Cancer Mother 7470    breast cancer    Social History History  Substance Use Topics  . Smoking status: Never Smoker   . Smokeless tobacco: Never Used  . Alcohol Use: No    Allergies  Allergen Reactions  . Erythromycin Other (See Comments)    Severe abdominal pain  . Penicillins Hives    Current Outpatient Prescriptions  Medication Sig Dispense Refill  . ibuprofen (ADVIL,MOTRIN) 200 MG tablet Take 200 mg by mouth every 6 (six) hours as needed.    . Multiple Vitamin (MULTIVITAMIN) capsule Take 1 capsule by mouth as needed.     No current facility-administered medications for this visit.    Review of Systems Review of Systems  Constitutional: Negative.   Respiratory:  Positive for cough.   Cardiovascular: Negative.   Gastrointestinal: Positive for abdominal distention.    Blood pressure 120/74, pulse 78, resp. rate 14, height 5\' 2"  (1.575 m), weight 160 lb (72.576 kg), last menstrual period 04/10/2014.  Physical Exam Physical Exam  Constitutional: She is oriented to person, place, and time. She appears well-developed and well-nourished.  Neck: Neck supple.  Cardiovascular: Normal rate, regular rhythm and normal heart sounds.   Pulmonary/Chest: Effort normal and breath sounds normal.  Abdominal: Soft. Normal appearance and bowel sounds are normal. There is tenderness. A hernia is present.    5 mm umbilical defect with tenderness. Diastasis recti present.  Lymphadenopathy:    She has no cervical adenopathy.  Neurological: She is alert and oriented to person, place, and time.  Skin: Skin is warm and dry.    Data Reviewed None  Assessment    Umbilical hernia, locally sensitive.  Diastases recti, mild, asymptomatic.  They intermittent discomfort in the right lower quadrant, no discernible hernia on clinical exam, likely traumatic from episodes of pneumonia in fall 2012 and spring 2016.    Plan    A mandatory intervention required at this time. If local inflammation as the source of the right lower quadrant pain it may respond  a course of anti-inflammatories.  If the right lower quadrant pain is persistent and she elects to proceed to umbilical hernia repair, laparoscopic exam could be completed at the same time.  She reports that she has had low back and hip pain, and question whether the diastases is responsible for this problem. This is really more of a visual issue with changes in position rather than a than an absolute indicator of muscle weakness. She may do any and all exercises she desires without fear of worsening this area.     Hernia precautions and incarceration were discussed with the patient. If they develop symptoms of an  incarcerated hernia, they were encouraged to seek prompt medical attention.  She will call back when she decides to proceed with surgery. Aleve two tablets BID x 5 days.Marland Kitchen  PCP:  Dimitri Ped 04/10/2014, 9:22 PM

## 2014-04-10 NOTE — Patient Instructions (Addendum)
The patient is aware to call back for any questions or concerns. call back when decides to proceed with surgery  Hernia A hernia occurs when an internal organ pushes out through a weak spot in the abdominal wall. Hernias most commonly occur in the groin and around the navel. Hernias often can be pushed back into place (reduced). Most hernias tend to get worse over time. Some abdominal hernias can get stuck in the opening (irreducible or incarcerated hernia) and cannot be reduced. An irreducible abdominal hernia which is tightly squeezed into the opening is at risk for impaired blood supply (strangulated hernia). A strangulated hernia is a medical emergency. Because of the risk for an irreducible or strangulated hernia, surgery may be recommended to repair a hernia. CAUSES   Heavy lifting.  Prolonged coughing.  Straining to have a bowel movement.  A cut (incision) made during an abdominal surgery. HOME CARE INSTRUCTIONS   Bed rest is not required. You may continue your normal activities.  Avoid lifting more than 10 pounds (4.5 kg) or straining.  Cough gently. If you are a smoker it is best to stop. Even the best hernia repair can break down with the continual strain of coughing. Even if you do not have your hernia repaired, a cough will continue to aggravate the problem.  Do not wear anything tight over your hernia. Do not try to keep it in with an outside bandage or truss. These can damage abdominal contents if they are trapped within the hernia sac.  Eat a normal diet.  Avoid constipation. Straining over long periods of time will increase hernia size and encourage breakdown of repairs. If you cannot do this with diet alone, stool softeners may be used. SEEK IMMEDIATE MEDICAL CARE IF:   You have a fever.  You develop increasing abdominal pain.  You feel nauseous or vomit.  Your hernia is stuck outside the abdomen, looks discolored, feels hard, or is tender.  You have any changes  in your bowel habits or in the hernia that are unusual for you.  You have increased pain or swelling around the hernia.  You cannot push the hernia back in place by applying gentle pressure while lying down. MAKE SURE YOU:   Understand these instructions.  Will watch your condition.  Will get help right away if you are not doing well or get worse. Document Released: 01/05/2005 Document Revised: 03/30/2011 Document Reviewed: 08/25/2007 [facility] Patient Information 2015 [place], [place]. This information is not intended to replace advice given to you by your health care provider. Make sure you discuss any questions you have with your health care provider.

## 2014-05-31 HISTORY — PX: UMBILICAL HERNIA REPAIR: SHX196

## 2014-05-31 HISTORY — PX: OTHER SURGICAL HISTORY: SHX169

## 2014-05-31 HISTORY — PX: VENTRAL HERNIA REPAIR: SHX424

## 2014-10-10 ENCOUNTER — Ambulatory Visit (INDEPENDENT_AMBULATORY_CARE_PROVIDER_SITE_OTHER): Payer: BLUE CROSS/BLUE SHIELD | Admitting: Family Medicine

## 2014-10-10 ENCOUNTER — Encounter: Payer: Self-pay | Admitting: Family Medicine

## 2014-10-10 VITALS — BP 118/64 | HR 63 | Temp 98.8°F | Resp 16 | Ht 62.0 in | Wt 161.9 lb

## 2014-10-10 DIAGNOSIS — E229 Hyperfunction of pituitary gland, unspecified: Secondary | ICD-10-CM

## 2014-10-10 DIAGNOSIS — Z13220 Encounter for screening for lipoid disorders: Secondary | ICD-10-CM | POA: Diagnosis not present

## 2014-10-10 DIAGNOSIS — F32A Depression, unspecified: Secondary | ICD-10-CM | POA: Insufficient documentation

## 2014-10-10 DIAGNOSIS — K429 Umbilical hernia without obstruction or gangrene: Secondary | ICD-10-CM | POA: Insufficient documentation

## 2014-10-10 DIAGNOSIS — F329 Major depressive disorder, single episode, unspecified: Secondary | ICD-10-CM | POA: Insufficient documentation

## 2014-10-10 DIAGNOSIS — R7989 Other specified abnormal findings of blood chemistry: Secondary | ICD-10-CM | POA: Insufficient documentation

## 2014-10-10 DIAGNOSIS — G4700 Insomnia, unspecified: Secondary | ICD-10-CM | POA: Insufficient documentation

## 2014-10-10 DIAGNOSIS — S060X0A Concussion without loss of consciousness, initial encounter: Secondary | ICD-10-CM

## 2014-10-10 DIAGNOSIS — Z23 Encounter for immunization: Secondary | ICD-10-CM

## 2014-10-10 DIAGNOSIS — J309 Allergic rhinitis, unspecified: Secondary | ICD-10-CM | POA: Insufficient documentation

## 2014-10-10 DIAGNOSIS — N200 Calculus of kidney: Secondary | ICD-10-CM | POA: Insufficient documentation

## 2014-10-10 DIAGNOSIS — L309 Dermatitis, unspecified: Secondary | ICD-10-CM | POA: Insufficient documentation

## 2014-10-10 DIAGNOSIS — G8929 Other chronic pain: Secondary | ICD-10-CM | POA: Insufficient documentation

## 2014-10-10 DIAGNOSIS — M549 Dorsalgia, unspecified: Secondary | ICD-10-CM

## 2014-10-10 MED ORDER — AMITRIPTYLINE HCL 25 MG PO TABS: 25.0000 mg | ORAL_TABLET | Freq: Every day | ORAL | Status: DC

## 2014-10-10 NOTE — Progress Notes (Signed)
Name: [name]   MRN: [id number]    DOB: [birth year]   Date:10/10/2014       Progress Note  Subjective  Chief Complaint  Chief Complaint  Patient presents with  . Head Injury    onset-saturday while in pool going down slide hit back of head on the edge of slide(whip lash) Patient states no dizzy but has felt nauseated  . Neck Pain    improving  . Headache    pretty constant since head injury    HPI  Concussion; she was going down the slide at her neighborhood pool and hit a bump and hit her head backwards on the slide. She felt like she was chocking when she went down in the water. Felt well for about 2 hours but after that developed nausea, headache, dizziness and also neck pain/stiffness that started one day later. She never lost consciousness. She states neck is feeling better, but still having daily headaches. She is trying to sleep more than usual but is just tired of the symptoms and came in to be checked. No mood changes  Elevated Prolactin: last year, negative MRI pituitary, we will recheck today, cycles had regulated, but last cycle was earlier than usual and lasted longer than usual. Due for a cycle today  Patient Active Problem List   Diagnosis Date Noted  . Allergic rhinitis 10/10/2014  . Back pain, chronic 10/10/2014  . Clinical depression 10/10/2014  . Dermatitis, eczematoid 10/10/2014  . Elevated prolactin level 10/10/2014  . Insomnia 10/10/2014  . Calculus of kidney 10/10/2014  . Infrequent menses 10/10/2014  . Umbilical hernia without obstruction and without gangrene 10/10/2014  . H/O allergy to multiple drugs 02/22/2012  . Blood type, Rh negative 02/22/2012  . Previous cesarean delivery x 4 02/22/2012  . Personal history of kidney stones 02/22/2012    Past Surgical History  Procedure Laterality Date  . Wisdom tooth extraction    . Foot surgery    . Cesarean section  2003,2005,2008,2010  . Umbilical hernia repair  05/31/2014  . Ventral hernia repair   05/31/2014    Family History  Problem Relation Age of Onset  . Cancer Mother 88    breast cancer  . Hyperlipidemia Father   . Hypertension Father   . Migraines Sister   . Hypertension Paternal Grandmother   . Stroke Paternal Grandfather     Social History   Social History  . Marital Status: Married    Spouse Name: N/A  . Number of Children: N/A  . Years of Education: N/A   Occupational History  . Not on file.   Social History Main Topics  . Smoking status: Never Smoker   . Smokeless tobacco: Never Used  . Alcohol Use: No  . Drug Use: No  . Sexual Activity:    Partners: Male    Birth Control/ Protection: None     Comment: married    Other Topics Concern  . Not on file   Social History Narrative     Current outpatient prescriptions:  .  amitriptyline (ELAVIL) 25 MG tablet, Take 1 tablet (25 mg total) by mouth at bedtime., Disp: 30 tablet, Rfl: 0 .  clobetasol ointment (TEMOVATE) 0.05 %, APPLY 1 APPLICATION TO SKIN TWICE DAILY, Disp: , Rfl: 4  Allergies  Allergen Reactions  . Erythromycin Other (See Comments)    Severe abdominal pain  . Penicillins Hives     ROS  Constitutional: Negative for fever or weight change.  Respiratory: Negative for  cough and shortness of breath.   Cardiovascular: Negative for chest pain or palpitations.  Gastrointestinal: Negative for abdominal pain, no bowel changes.  Musculoskeletal: Negative for gait problem or joint swelling.  Skin: Negative for rash.  Neurological: Negative for dizziness, positive for  headache.  No other specific complaints in a complete review of systems (except as listed in HPI above).  Objective  Filed Vitals:   10/10/14 1213  BP: 118/64  Pulse: 63  Temp: 98.8 F (37.1 C)  TempSrc: Oral  Resp: 16  Height:  (1.575 m)  Weight: 161 lb 14.4 oz (73.437 kg)  SpO2: 97%    Body mass index is 29.6 kg/(m^2).  Physical Exam   Constitutional: Patient appears well-developed and  well-nourished. Obese  No distress.  HEENT: head atraumatic, normocephalic, pupils equal and reactive to light, , neck supple, but tender sternocleidomastoid bilaterally, throat within normal limits Cardiovascular: Normal rate, regular rhythm and normal heart sounds.  No murmur heard. No BLE edema. Pulmonary/Chest: Effort normal and breath sounds normal. No respiratory distress. Abdominal: Soft.  There is no tenderness. Psychiatric: Patient has a normal mood and affect. behavior is normal. Judgment and thought content normal. Neurological exam: cranial nerves intact, normal sensation and coordination, normal gait, patella reflexes 2 plus bilaterally, Romberg negative   PHQ2/9: Depression screen PHQ 2/9 10/10/2014  Decreased Interest 0  Down, Depressed, Hopeless 0  PHQ - 2 Score 0    Fall Risk: Fall Risk  10/10/2014  Falls in the past year? No    Functional Status Survey: Is the patient deaf or have difficulty hearing?: No Does the patient have difficulty seeing, even when wearing glasses/contacts?: No Does the patient have difficulty concentrating, remembering, or making decisions?: No Does the patient have difficulty walking or climbing stairs?: No Does the patient have difficulty dressing or bathing?: No Does the patient have difficulty doing errands alone such as visiting a doctor's office or shopping?: No   Assessment & Plan  1. Concussion, without loss of consciousness, initial encounter  Explained that mental rest is the main treatment for concussion. Try not to read, watch TV, use computer and sleep as much as possible. We will start her on Elavil since her main complaint at this time is headache.  - amitriptyline (ELAVIL) 25 MG tablet; Take 1 tablet (25 mg total) by mouth at bedtime.  Dispense: 30 tablet; Refill: 0  2. Needs flu shot  - Flu Vaccine QUAD 36+ mos PF IM (Fluarix & Fluzone Quad PF)  3. Elevated prolactin level  We will recheck levels and refer her to Endo  if still elevated - Prolactin  4. Lipid screening  - Lipid panel

## 2014-10-11 LAB — LIPID PANEL
CHOLESTEROL TOTAL: 186 mg/dL (ref 100–199)
Chol/HDL Ratio: 3 ratio units (ref 0.0–4.4)
HDL: 63 mg/dL (ref >39)
LDL Calculated: 95 mg/dL (ref 0–99)
TRIGLYCERIDES: 142 mg/dL (ref 0–149)
VLDL CHOLESTEROL CAL: 28 mg/dL (ref 5–40)

## 2014-10-11 LAB — PROLACTIN: PROLACTIN: 4.6 ng/mL — AB (ref 4.8–23.3)

## 2014-10-11 NOTE — Progress Notes (Signed)
Pt.notified

## 2014-11-14 ENCOUNTER — Other Ambulatory Visit: Payer: Self-pay | Admitting: Family Medicine

## 2014-11-16 NOTE — Telephone Encounter (Signed)
Left voice message on both numbers for patient to schedule appointment and to inform her that prescription has been called in.

## 2014-11-19 NOTE — Telephone Encounter (Signed)
Pt called this morning stating she received a call on 11/16/14 about a refill. Pt states she no longer needs this medication that she is doing quit well and she is calling her pharmacy to cancel that RX.

## 2015-09-13 DIAGNOSIS — L570 Actinic keratosis: Secondary | ICD-10-CM | POA: Diagnosis not present

## 2015-09-13 DIAGNOSIS — L812 Freckles: Secondary | ICD-10-CM | POA: Diagnosis not present

## 2015-09-13 DIAGNOSIS — L308 Other specified dermatitis: Secondary | ICD-10-CM | POA: Diagnosis not present

## 2015-09-13 DIAGNOSIS — D2262 Melanocytic nevi of left upper limb, including shoulder: Secondary | ICD-10-CM | POA: Diagnosis not present

## 2015-09-13 DIAGNOSIS — D2261 Melanocytic nevi of right upper limb, including shoulder: Secondary | ICD-10-CM | POA: Diagnosis not present

## 2015-10-25 ENCOUNTER — Ambulatory Visit (INDEPENDENT_AMBULATORY_CARE_PROVIDER_SITE_OTHER): Payer: BLUE CROSS/BLUE SHIELD | Admitting: Podiatry

## 2015-10-25 ENCOUNTER — Encounter: Payer: Self-pay | Admitting: Podiatry

## 2015-10-25 ENCOUNTER — Ambulatory Visit (INDEPENDENT_AMBULATORY_CARE_PROVIDER_SITE_OTHER): Payer: BLUE CROSS/BLUE SHIELD

## 2015-10-25 VITALS — BP 114/65 | HR 65 | Resp 16 | Ht 61.0 in | Wt 160.0 lb

## 2015-10-25 DIAGNOSIS — M79672 Pain in left foot: Secondary | ICD-10-CM | POA: Diagnosis not present

## 2015-10-25 DIAGNOSIS — M722 Plantar fascial fibromatosis: Secondary | ICD-10-CM

## 2015-10-25 MED ORDER — MELOXICAM 15 MG PO TABS
15.0000 mg | ORAL_TABLET | Freq: Every day | ORAL | 1 refills | Status: AC
Start: 2015-10-25 — End: 2015-11-24

## 2015-10-25 NOTE — Patient Instructions (Signed)
Plantar Fasciitis Plantar fasciitis is a painful foot condition that affects the heel. It occurs when the band of tissue that connects the toes to the heel bone (plantar fascia) becomes irritated. This can happen after exercising too much or doing other repetitive activities (overuse injury). The pain from plantar fasciitis can range from mild irritation to severe pain that makes it difficult for you to walk or move. The pain is usually worse in the morning or after you have been sitting or lying down for a while. CAUSES This condition may be caused by:  Standing for long periods of time.  Wearing shoes that do not fit.  Doing high-impact activities, including running, aerobics, and ballet.  Being overweight.  Having an abnormal way of walking (gait).  Having tight calf muscles.  Having high arches in your feet.  Starting a new athletic activity. SYMPTOMS The main symptom of this condition is heel pain. Other symptoms include:  Pain that gets worse after activity or exercise.  Pain that is worse in the morning or after resting.  Pain that goes away after you walk for a few minutes. DIAGNOSIS This condition may be diagnosed based on your signs and symptoms. Your health care provider will also do a physical exam to check for:  A tender area on the bottom of your foot.  A high arch in your foot.  Pain when you move your foot.  Difficulty moving your foot. You may also need to have imaging studies to confirm the diagnosis. These can include:  X-rays.  Ultrasound.  MRI. TREATMENT  Treatment for plantar fasciitis depends on the severity of the condition. Your treatment may include:  Rest, ice, and over-the-counter pain medicines to manage your pain.  Exercises to stretch your calves and your plantar fascia.  A splint that holds your foot in a stretched, upward position while you sleep (night splint).  Physical therapy to relieve symptoms and prevent problems in the  future.  Cortisone injections to relieve severe pain.  Extracorporeal shock wave therapy (ESWT) to stimulate damaged plantar fascia with electrical impulses. It is often used as a last resort before surgery.  Surgery, if other treatments have not worked after 12 months. HOME CARE INSTRUCTIONS  Take medicines only as directed by your health care provider.  Avoid activities that cause pain.  Roll the bottom of your foot over a bag of ice or a bottle of cold water. Do this for 20 minutes, 3-4 times a day.  Perform simple stretches as directed by your health care provider.  Try wearing athletic shoes with air-sole or gel-sole cushions or soft shoe inserts.  Wear a night splint while sleeping, if directed by your health care provider.  Keep all follow-up appointments with your health care provider. PREVENTION   Do not perform exercises or activities that cause heel pain.  Consider finding low-impact activities if you continue to have problems.  Lose weight if you need to. The best way to prevent plantar fasciitis is to avoid the activities that aggravate your plantar fascia. SEEK MEDICAL CARE IF:  Your symptoms do not go away after treatment with home care measures.  Your pain gets worse.  Your pain affects your ability to move or do your daily activities.   This information is not intended to replace advice given to you by your health care provider. Make sure you discuss any questions you have with your health care provider.   Document Released: 09/30/2000 Document Revised: 09/26/2014 Document Reviewed: 11/15/2013 Elsevier   Interactive Patient Education 2016 [employer] Inc.  

## 2015-10-25 NOTE — Progress Notes (Signed)
   Subjective:    Patient ID: [name], female    DOB: 12/04/1970, 45 y.o.   MRN: [id number]  HPI    Review of Systems  All other systems reviewed and are negative.      Objective:   Physical Exam        Assessment & Plan:

## 2015-10-26 MED ORDER — BETAMETHASONE SOD PHOS & ACET 6 (3-3) MG/ML IJ SUSP: 12.0000 mg | Freq: Once | INTRAMUSCULAR | Status: DC

## 2015-10-26 NOTE — Progress Notes (Signed)
Patient ID: Gina SituLauren A Ellison, female   DOB: 06/07/1970, 45 y.o.   MRN: [id number] Subjective: Patient presents with a chief complaint of plantar fasciitis to the left foot. Patient states that she's had pain and tenderness to the left heel since June 2017. Patient presents today for further treatment and evaluation  Objective: Physical Exam General: The patient is alert and oriented x3 in no acute distress.  Dermatology: Skin is warm, dry and supple bilateral lower extremities. Negative for open lesions or macerations bilateral.   Vascular: Dorsalis Pedis and Posterior Tibial pulses palpable bilateral.  Capillary fill time is immediate to all digits.  Neurological: Epicritic and protective threshold intact bilateral.   Musculoskeletal: Tenderness to palpation at the medial calcaneal tubercale and through the insertion of the plantar fascia of the left foot. All other joints range of motion within normal limits bilateral. Strength 5/5 in all groups bilateral.   Radiographic exam:   Normal osseous mineralization. Joint spaces preserved. No fracture/dislocation/boney destruction. Calcaneal spur present with mild thickening of plantar fascia left. No other soft tissue abnormalities or radiopaque foreign bodies.   Assessment: #1 plantar fasciitis left foot #2 pain in left foot   Plan of Care:   1. Patient evaluated. Xrays reviewed.   2. Injection of 0.5cc Celestone soluspan injected into the left plantar fascia.  3. Instructed patient regarding therapies and modalities at home to alleviate symptoms.  4. Rx for meloxicam given to patient.  5. Plantar fascial band(s) dispensed. 6. Today the patient was scanned for custom molded orthotics. 7. Today a prescription for anti-inflammatory pain cream [facility] 8. Return to clinic in 4 weeks.

## 2015-10-31 DIAGNOSIS — Z6830 Body mass index (BMI) 30.0-30.9, adult: Secondary | ICD-10-CM | POA: Diagnosis not present

## 2015-10-31 DIAGNOSIS — Z01419 Encounter for gynecological examination (general) (routine) without abnormal findings: Secondary | ICD-10-CM | POA: Diagnosis not present

## 2015-10-31 DIAGNOSIS — Z1231 Encounter for screening mammogram for malignant neoplasm of breast: Secondary | ICD-10-CM | POA: Diagnosis not present

## 2015-10-31 DIAGNOSIS — Z124 Encounter for screening for malignant neoplasm of cervix: Secondary | ICD-10-CM | POA: Diagnosis not present

## 2015-10-31 LAB — HM MAMMOGRAPHY: HM MAMMO: NORMAL (ref 0–4)

## 2015-10-31 LAB — HM PAP SMEAR

## 2015-11-04 DIAGNOSIS — R87810 Cervical high risk human papillomavirus (HPV) DNA test positive: Secondary | ICD-10-CM | POA: Insufficient documentation

## 2015-11-13 ENCOUNTER — Encounter: Payer: Self-pay | Admitting: Podiatry

## 2015-11-22 ENCOUNTER — Ambulatory Visit (INDEPENDENT_AMBULATORY_CARE_PROVIDER_SITE_OTHER): Payer: BLUE CROSS/BLUE SHIELD | Admitting: Podiatry

## 2015-11-22 DIAGNOSIS — M722 Plantar fascial fibromatosis: Secondary | ICD-10-CM

## 2015-11-22 NOTE — Patient Instructions (Signed)

## 2015-11-22 NOTE — Progress Notes (Signed)
Patient ID: Gina SituLauren A Marcotte, female   DOB: 06/19/1970, 45 y.o.   MRN: [id number]  Patient presents for orthotic pick up.  Verbal and written break in and wear instructions given.  Patient will follow up in 4 weeks if symptoms worsen or fail to improve.

## 2015-12-20 ENCOUNTER — Ambulatory Visit (INDEPENDENT_AMBULATORY_CARE_PROVIDER_SITE_OTHER): Payer: BLUE CROSS/BLUE SHIELD | Admitting: Podiatry

## 2015-12-20 DIAGNOSIS — M722 Plantar fascial fibromatosis: Secondary | ICD-10-CM

## 2015-12-20 DIAGNOSIS — M79672 Pain in left foot: Secondary | ICD-10-CM | POA: Diagnosis not present

## 2015-12-20 MED ORDER — NONFORMULARY OR COMPOUNDED ITEM: 1.0000 g | Freq: Four times a day (QID) | 2 refills | Status: DC

## 2015-12-22 MED ORDER — BETAMETHASONE SOD PHOS & ACET 6 (3-3) MG/ML IJ SUSP: 3.0000 mg | Freq: Once | INTRAMUSCULAR | Status: DC

## 2015-12-22 NOTE — Progress Notes (Signed)
Patient ID: Dian SituLauren A Mahoney, female   DOB: 01/07/1971, 45 y.o.   MRN: [id number]  Subjective: Patient presents today for follow-up evaluation of plantar fasciitis left foot. Patient states that he did get her orthotics. They are helping. She says the injection did help last time. She also never received the anti-inflammatory pain cream dispensed through [facility]. Patient presents today for further treatment and evaluation  Objective: Physical Exam General: The patient is alert and oriented x3 in no acute distress.  Dermatology: Skin is warm, dry and supple bilateral lower extremities. Negative for open lesions or macerations bilateral.   Vascular: Dorsalis Pedis and Posterior Tibial pulses palpable bilateral.  Capillary fill time is immediate to all digits.  Neurological: Epicritic and protective threshold intact bilateral.   Musculoskeletal: Tenderness to palpation at the medial calcaneal tubercale and through the insertion of the plantar fascia of the left foot. All other joints range of motion within normal limits bilateral. Strength 5/5 in all groups bilateral.   Assessment: 1. Plantar fasciitis left foot 2. Pain in left foot  Plan of Care:   1. Patient evaluated. Xrays reviewed.   2. Injection of 0.5cc Celestone soluspan injected into the left plantar fascia.  3. Instructed patient regarding therapies and modalities at home to alleviate symptoms.  4. Continue meloxicam and custom molded orthotics 5. Reorder of anti-inflammatory pain cream dispensed through [facility].  6. Return to clinic in 4 weeks.

## 2016-01-24 ENCOUNTER — Ambulatory Visit (INDEPENDENT_AMBULATORY_CARE_PROVIDER_SITE_OTHER): Payer: BLUE CROSS/BLUE SHIELD | Admitting: Podiatry

## 2016-01-24 DIAGNOSIS — M722 Plantar fascial fibromatosis: Secondary | ICD-10-CM

## 2016-01-24 DIAGNOSIS — M79672 Pain in left foot: Secondary | ICD-10-CM

## 2016-01-24 NOTE — Progress Notes (Signed)
Patient ID: [name], female   DOB: 07/03/1970, 46 y.o.   MRN: [id number]  Subjective: Patient presents today for follow-up evaluation of plantar fasciitis left foot. Patient states that she is approximately 90% better. Patient continues to wear her custom molded orthotics and does home stretching exercises.   Objective: Physical Exam General: The patient is alert and oriented x3 in no acute distress.  Dermatology: Skin is warm, dry and supple bilateral lower extremities. Negative for open lesions or macerations bilateral.   Vascular: Dorsalis Pedis and Posterior Tibial pulses palpable bilateral.  Capillary fill time is immediate to all digits.  Neurological: Epicritic and protective threshold intact bilateral.   Musculoskeletal: Negative for tenderness to palpation at the medial calcaneal tubercale and through the insertion of the plantar fascia of the left foot. All other joints range of motion within normal limits bilateral. Strength 5/5 in all groups bilateral.   Assessment: 1. Plantar fasciitis left foot - resolved 2. Pain in left foot-resolved  Plan of Care:   1. Patient evaluated.  2. Continue custom molded orthotics, home stretching exercises, anti-inflammatory pain cream 3. Return to clinic when necessary

## 2016-05-19 ENCOUNTER — Encounter: Payer: Self-pay | Admitting: Family Medicine

## 2016-05-19 ENCOUNTER — Ambulatory Visit (INDEPENDENT_AMBULATORY_CARE_PROVIDER_SITE_OTHER): Payer: BLUE CROSS/BLUE SHIELD | Admitting: Family Medicine

## 2016-05-19 VITALS — BP 110/60 | HR 77 | Temp 98.4°F | Resp 16 | Ht 61.0 in | Wt 165.6 lb

## 2016-05-19 DIAGNOSIS — E229 Hyperfunction of pituitary gland, unspecified: Secondary | ICD-10-CM

## 2016-05-19 DIAGNOSIS — Z13220 Encounter for screening for lipoid disorders: Secondary | ICD-10-CM

## 2016-05-19 DIAGNOSIS — Z0000 Encounter for general adult medical examination without abnormal findings: Secondary | ICD-10-CM | POA: Diagnosis not present

## 2016-05-19 DIAGNOSIS — Z01419 Encounter for gynecological examination (general) (routine) without abnormal findings: Secondary | ICD-10-CM

## 2016-05-19 DIAGNOSIS — F410 Panic disorder [episodic paroxysmal anxiety] without agoraphobia: Secondary | ICD-10-CM | POA: Diagnosis not present

## 2016-05-19 DIAGNOSIS — R7989 Other specified abnormal findings of blood chemistry: Secondary | ICD-10-CM

## 2016-05-19 DIAGNOSIS — R000 Tachycardia, unspecified: Secondary | ICD-10-CM

## 2016-05-19 DIAGNOSIS — Z131 Encounter for screening for diabetes mellitus: Secondary | ICD-10-CM | POA: Diagnosis not present

## 2016-05-19 DIAGNOSIS — M722 Plantar fascial fibromatosis: Secondary | ICD-10-CM

## 2016-05-19 LAB — CBC WITH DIFFERENTIAL/PLATELET
BASOS ABS: 0 {cells}/uL (ref 0–200)
Basophils Relative: 0 %
Eosinophils Absolute: 219 cells/uL (ref 15–500)
Eosinophils Relative: 3 %
HEMATOCRIT: 46.1 % — AB (ref 35.0–45.0)
Hemoglobin: 15.3 g/dL (ref 11.7–15.5)
LYMPHS ABS: 1898 {cells}/uL (ref 850–3900)
Lymphocytes Relative: 26 %
MCH: 29.6 pg (ref 27.0–33.0)
MCHC: 33.2 g/dL (ref 32.0–36.0)
MCV: 89.2 fL (ref 80.0–100.0)
MONO ABS: 292 {cells}/uL (ref 200–950)
MPV: 9.8 fL (ref 7.5–12.5)
Monocytes Relative: 4 %
NEUTROS PCT: 67 %
Neutro Abs: 4891 cells/uL (ref 1500–7800)
Platelets: 257 10*3/uL (ref 140–400)
RBC: 5.17 MIL/uL — ABNORMAL HIGH (ref 3.80–5.10)
RDW: 13.5 % (ref 11.0–15.0)
WBC: 7.3 10*3/uL (ref 3.8–10.8)

## 2016-05-19 LAB — TSH: TSH: 1.01 mIU/L

## 2016-05-19 NOTE — Patient Instructions (Signed)
[facility]

## 2016-05-19 NOTE — Progress Notes (Signed)
Name: [name]   MRN: [id number]    DOB: 1970-06-02   Date:05/19/2016       Progress Note  Subjective  Chief Complaint  Chief Complaint  Patient presents with  . Annual Exam    HPI  Well Woman: she is married, has 4 children, pap smear is up to date, she had pap smear and mammogram done by gyn last year, and we will get records. She has been feeling more tired than usual, likely from stress. Denies pain during sex, cycles still heavy and painful, and lasts about 5 days but every 2 months, irregular cycles.   Stress: she has been under a lot of stress at work over the past 9 months, she feels threatened by another employee that is making false accusations about her. She is constantly thinking about that person. She does not enjoy going to work because of it. She states that when she sees the other employee car makes her feel tense. She has recently noticed palpitation associated with chest pain, she feels like her heart will jump out of her chest, it has happened during the night and other times during the day. Episodes more frequent when stress is higher at work. She has been exercising more, self talking to calm down and she does not want medications at this time.    Patient Active Problem List   Diagnosis Date Noted  . Plantar fasciitis, left 05/19/2016  . Allergic rhinitis 10/10/2014  . Back pain, chronic 10/10/2014  . Clinical depression 10/10/2014  . Dermatitis, eczematoid 10/10/2014  . Elevated prolactin level (HCC) 10/10/2014  . Insomnia 10/10/2014  . Calculus of kidney 10/10/2014  . Infrequent menses 10/10/2014  . Umbilical hernia without obstruction and without gangrene 10/10/2014  . H/O allergy to multiple drugs 02/22/2012  . Blood type, Rh negative 02/22/2012  . Previous cesarean delivery x 4 02/22/2012  . Personal history of kidney stones 02/22/2012    Past Surgical History:  Procedure Laterality Date  . CESAREAN SECTION  2003,2005,2008,2010  . FOOT SURGERY      neuroma left foot  . tummy tuck  05/31/2014  . UMBILICAL HERNIA REPAIR  05/31/2014  . VENTRAL HERNIA REPAIR  05/31/2014  . WISDOM TOOTH EXTRACTION      Family History  Problem Relation Age of Onset  . Cancer Mother 27    breast cancer  . Hyperlipidemia Father   . Hypertension Father   . Migraines Sister   . Hypertension Paternal Grandmother   . Stroke Paternal Grandfather     Social History   Social History  . Marital status: Married    Spouse name: N/A  . Number of children: 4  . Years of education: N/A   Occupational History  . professor [employer]   Social History Main Topics  . Smoking status: Never Smoker  . Smokeless tobacco: Never Used  . Alcohol use No  . Drug use: No  . Sexual activity: Yes    Partners: Male    Birth control/ protection: Rhythm     Comment: married    Other Topics Concern  . Not on file   Social History Narrative   Married, she has 4 daughters   She works as Network engineer at [employer], and is on leadership role and teaching     Current Outpatient Prescriptions:  .  clobetasol ointment (TEMOVATE) 0.05 %, APPLY 1 APPLICATION TO SKIN TWICE DAILY, Disp: , Rfl: 4 .  NONFORMULARY OR COMPOUNDED ITEM,  [facility]chilles Tendonitis [facility] 2 refills, Disp: , Rfl:   Allergies  Allergen Reactions  . Erythromycin Other (See Comments)    Severe abdominal pain  . Penicillins Hives     ROS  Constitutional: Negative for fever or weight change.  Respiratory: Negative for cough and shortness of breath.   Cardiovascular: Negative for chest pain or palpitations.  Gastrointestinal: Negative for abdominal pain, no bowel changes.  Musculoskeletal: Negative for gait problem or joint swelling.  Skin: Negative for rash.  Neurological: Negative for dizziness or headache.  No other specific complaints in a complete review of systems (except as listed in HPI above).  Objective  Vitals:   05/19/16 1130  BP: 110/60  Pulse: 77   Resp: 16  Temp: 98.4 F (36.9 C)  SpO2: 97%  Weight: 165 lb 9 oz (75.1 kg)  Height:  (1.549 m)    Body mass index is 31.28 kg/m.  Physical Exam  Constitutional: Patient appears well-developed and well-nourished. No distress.  HENT: Head: Normocephalic and atraumatic. Ears: B TMs ok, no erythema or effusion; Nose: Nose normal. Mouth/Throat: Oropharynx is clear and moist. No oropharyngeal exudate.  Eyes: Conjunctivae and EOM are normal. Pupils are equal, round, and reactive to light. No scleral icterus.  Neck: Normal range of motion. Neck supple. No JVD present. No thyromegaly present.  Cardiovascular: Normal rate, regular rhythm and normal heart sounds.  No murmur heard. No BLE edema. Pulmonary/Chest: Effort normal and breath sounds normal. No respiratory distress. Abdominal: Soft. Bowel sounds are normal, no distension. There is no tenderness. no masses Breast: no lumps or masses, no nipple discharge or rashes FEMALE GENITALIA:  External genitalia normal External urethra normal Vaginal vault normal without discharge or lesions Not done RECTAL: not done Musculoskeletal: Normal range of motion, no joint effusions. No gross deformities Neurological: he is alert and oriented to person, place, and time. No cranial nerve deficit. Coordination, balance, strength, speech and gait are normal.  Skin: Skin is warm and dry. No rash noted. No erythema.  Psychiatric: Patient has a normal mood and affect. behavior is normal. Judgment and thought content normal.  PHQ2/9: Depression screen PHQ 2/9 10/10/2014  Decreased Interest 0  Down, Depressed, Hopeless 0  PHQ - 2 Score 0     Fall Risk: Fall Risk  10/10/2014  Falls in the past year? No     Assessment & Plan  1. Well woman exam  Discussed importance of 150 minutes of physical activity weekly, eat two servings of fish weekly, eat one serving of tree nuts ( cashews, pistachios, pecans, almonds.Marland Kitchen) every other day, eat 6 servings  of fruit/vegetables daily and drink plenty of water and avoid sweet beverages.   - COMPLETE METABOLIC PANEL WITH GFR - Hemoglobin A1c - Lipid panel - Vitamin B12 - VITAMIN D 25 Hydroxy (Vit-D Deficiency, Fractures) - TSH - CBC with Differential/Platelet  2. Lipid screening  - Lipid panel  3. Diabetes mellitus screening  - Hemoglobin A1c  4. Elevated prolactin level (HCC)  - Prolactin  5. Plantar fasciitis, left  Discussed having k- laser at [facility] chiropractor.   6. Panic attack  Likely the cause of palpitation, under a lot of stress at work.  Discussed mindfulness.  -EKG : sinus bradycardia, no concerning findings, likely palpitation is from panic attack, she will call back if worsening of symptoms   7. Tachycardia  - CBC with Differential/Platelet - TSH

## 2016-05-20 ENCOUNTER — Encounter: Payer: Self-pay | Admitting: Family Medicine

## 2016-05-20 LAB — COMPLETE METABOLIC PANEL WITH GFR
ALBUMIN: 4.6 g/dL (ref 3.6–5.1)
ALT: 15 U/L (ref 6–29)
AST: 18 U/L (ref 10–35)
Alkaline Phosphatase: 85 U/L (ref 33–115)
BUN: 16 mg/dL (ref 7–25)
CHLORIDE: 102 mmol/L (ref 98–110)
CO2: 22 mmol/L (ref 20–31)
Calcium: 9.3 mg/dL (ref 8.6–10.2)
Creat: 0.74 mg/dL (ref 0.50–1.10)
GFR, Est African American: >89 mL/min (ref >=60)
GFR, Est Non African American: >89 mL/min (ref >=60)
GLUCOSE: 78 mg/dL (ref 65–99)
Potassium: 4.5 mmol/L (ref 3.5–5.3)
SODIUM: 137 mmol/L (ref 135–146)
Total Bilirubin: 0.5 mg/dL (ref 0.2–1.2)
Total Protein: 7.6 g/dL (ref 6.1–8.1)

## 2016-05-20 LAB — VITAMIN D 25 HYDROXY (VIT D DEFICIENCY, FRACTURES): Vit D, 25-Hydroxy: 26 ng/mL — ABNORMAL LOW (ref 30–100)

## 2016-05-20 LAB — LIPID PANEL
CHOL/HDL RATIO: 2.5 ratio (ref <5.0)
Cholesterol: 183 mg/dL (ref <200)
HDL: 72 mg/dL (ref >50)
LDL CALC: 97 mg/dL (ref <100)
Triglycerides: 72 mg/dL (ref <150)
VLDL: 14 mg/dL (ref <30)

## 2016-05-20 LAB — HEMOGLOBIN A1C
Hgb A1c MFr Bld: 4.8 % (ref <5.7)
Mean Plasma Glucose: 91 mg/dL

## 2016-05-20 LAB — PROLACTIN: Prolactin: 3.7 ng/mL

## 2016-05-20 LAB — VITAMIN B12: Vitamin B-12: 461 pg/mL (ref 200–1100)

## 2016-06-26 IMAGING — MR MRI HEAD WITHOUT AND WITH CONTRAST
9 of 18 series · 32 of 48 positions shown · IV contrast (multihance)
Comparison: Brain MRI 03/02/2012.

CLINICAL DATA: 43-year-old female with elevated prolactin levels,
amenorrhea, headaches. Initial encounter.

EXAM:
MRI HEAD WITHOUT AND WITH CONTRAST
TECHNIQUE: Multiplanar, multiecho pulse sequences of the brain and surrounding
structures were obtained without and with intravenous contrast.
CONTRAST:  10 mL MultiHance.

[Series 4: DWI · axial · 5.0mm · 1.80mm/px · z∈[-36,+120]mm · 4 of 25 slices shown (1 of 2)]
[im 1/25]
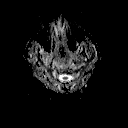
[im 9/25]
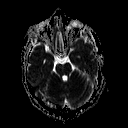
[im 17/25]
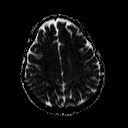
[im 25/25]
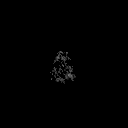

[Series 5: DWI · axial · 5.0mm · 1.80mm/px · z∈[-36,+114]mm · 3 of 24 slices shown (2 of 2)]
[im 1/24]
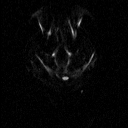
[im 12/24]
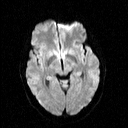
[im 24/24]
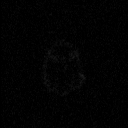

[Series 6: T2 · axial · 5.0mm · 0.43mm/px · z∈[-36,+120]mm · 3 of 25 slices shown (1 of 2)]
[im 1/25]
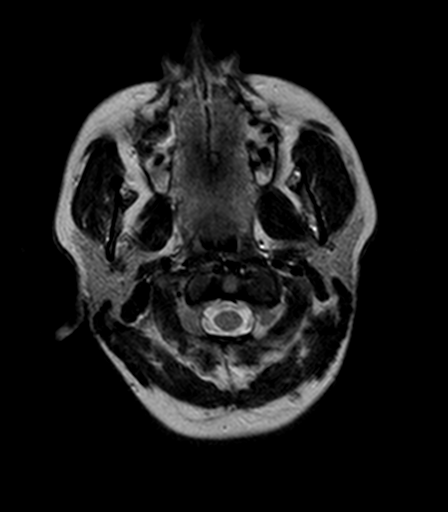
[im 13/25]
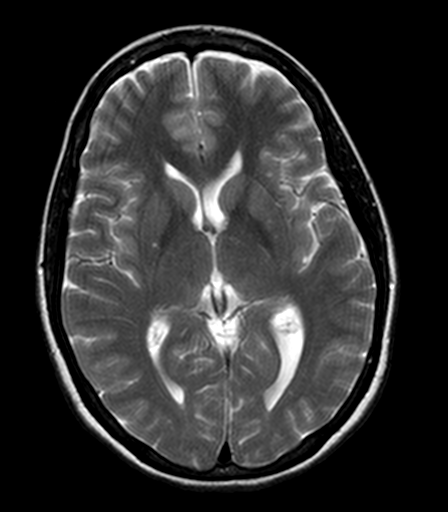
[im 25/25]
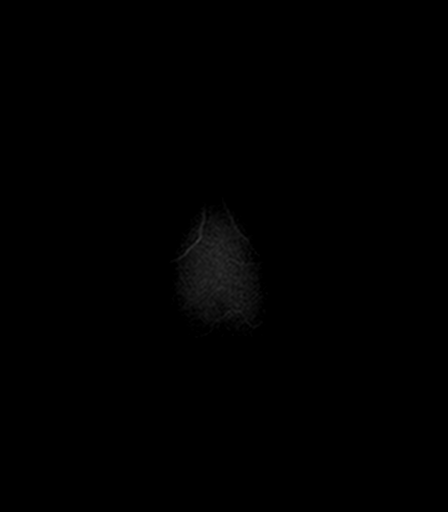

[Series 7: FLAIR · axial · 5.0mm · 0.43mm/px · z∈[-36,+120]mm · 3 of 25 slices shown]
[im 1/25]
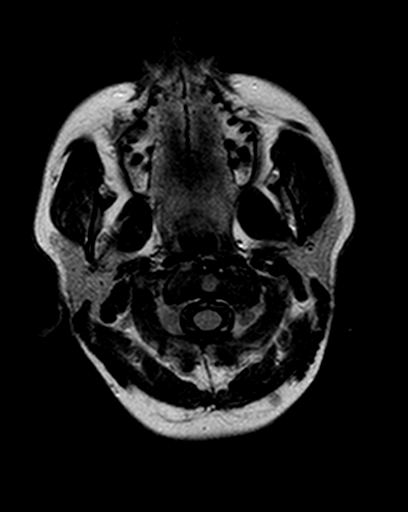
[im 13/25]
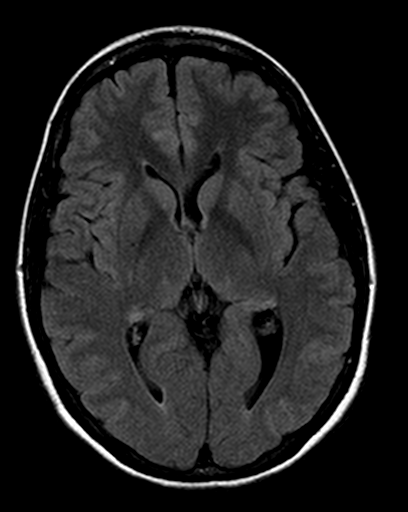
[im 25/25]
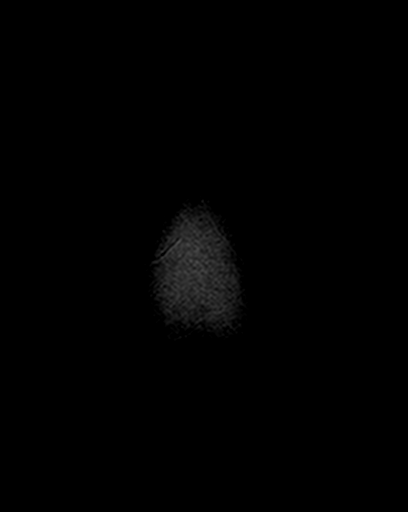

[Series 8: T2 · axial · 5.0mm · 0.43mm/px · z∈[-36,+120]mm · 3 of 25 slices shown (2 of 2)]
[im 1/25]
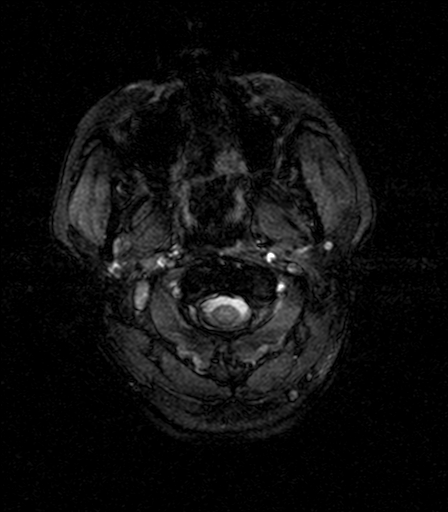
[im 13/25]
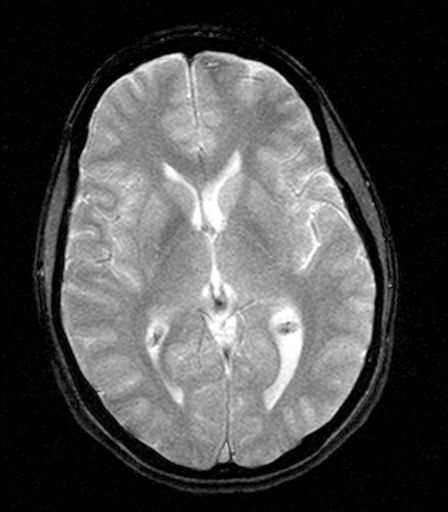
[im 25/25]
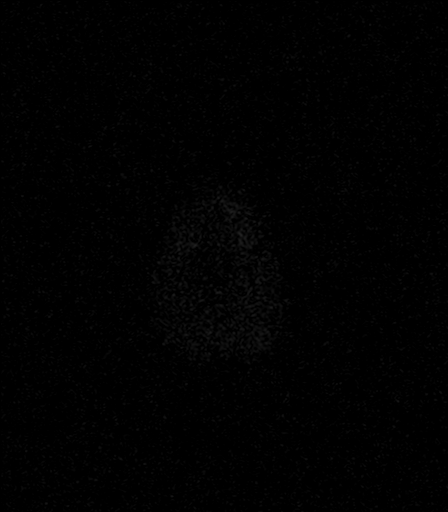

[Series 22: T1 post-contrast · sagittal · 3.0mm · 0.62mm/px · 2 of 15 slices shown (1 of 4)]
[im 1/15]
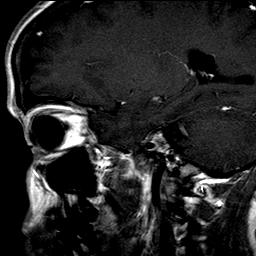
[im 15/15]
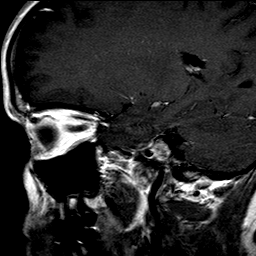

[Series 23: T1 post-contrast · coronal · 3.0mm · 0.62mm/px · 2 of 15 slices shown (2 of 4)]
[im 1/15]
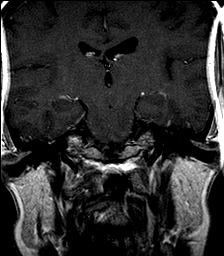
[im 15/15]
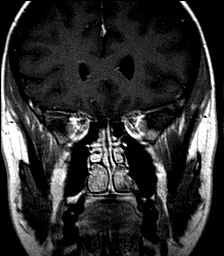

[Series 24: T1 post-contrast · axial · 3.0mm · 0.43mm/px · z∈[-52,+136]mm · 8 of 64 slices shown (3 of 4)]
[im 1/64]
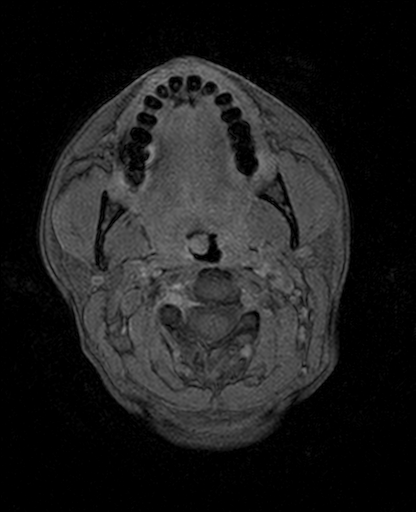
[im 8/64]
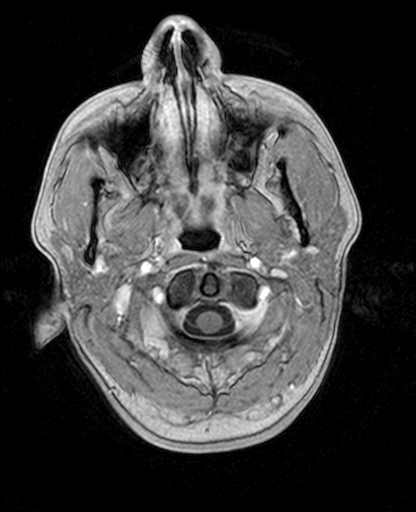
[im 16/64]
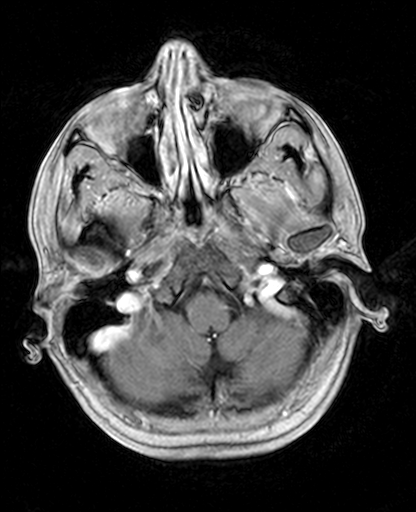
[im 24/64]
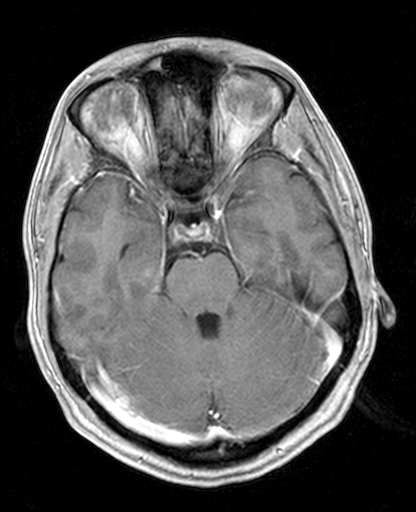
[im 40/64]
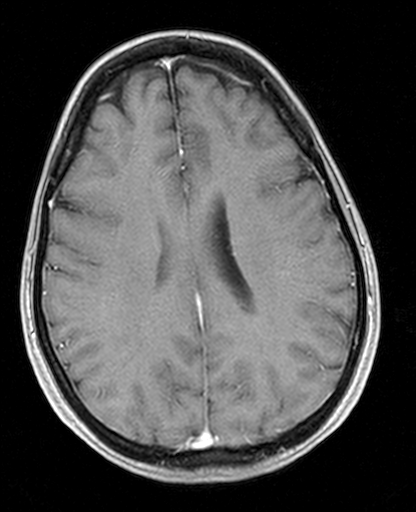
[im 48/64]
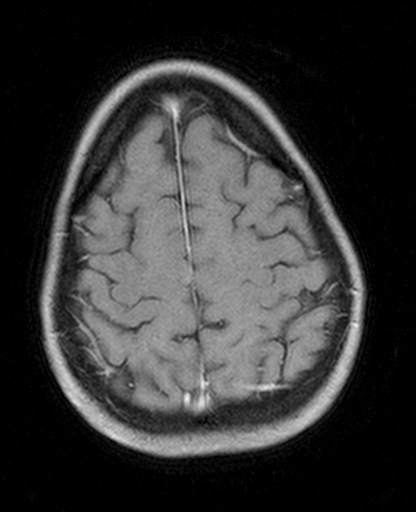
[im 56/64]
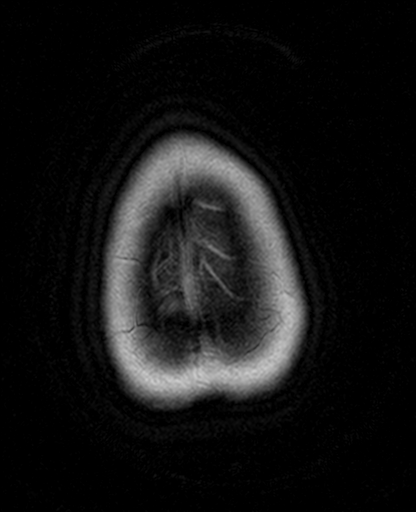
[im 64/64]
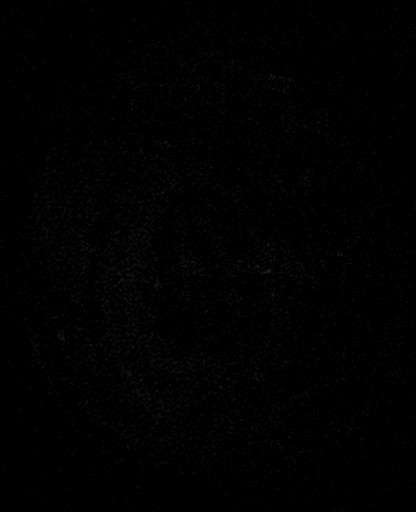

[Series 25: T1 post-contrast · coronal · 5.0mm · 0.43mm/px · 4 of 31 slices shown (4 of 4)]
[im 1/31]
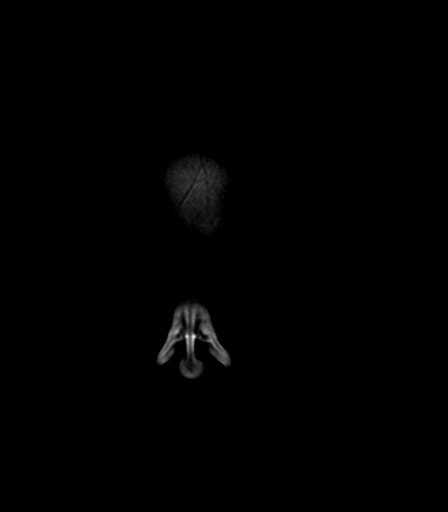
[im 11/31]
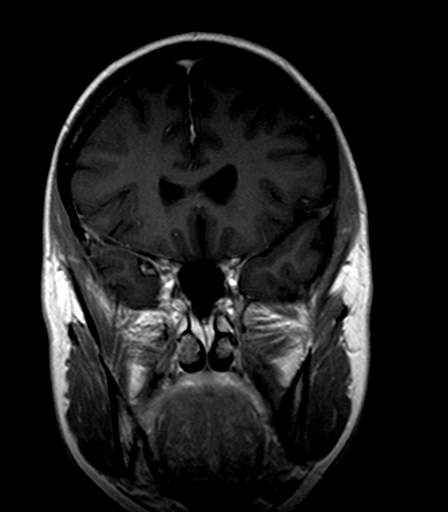
[im 21/31]
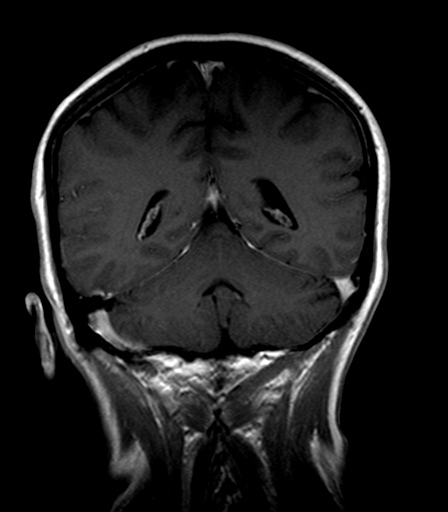
[im 31/31]
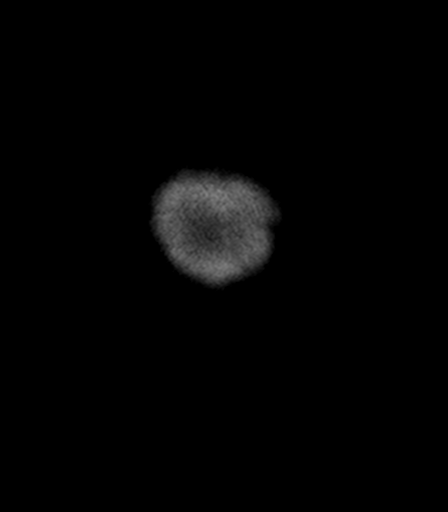

[32 of 48 positions shown; findings below may reference images not displayed]

FINDINGS: Stable and normal for age cerebral volume. Major intracranial
vascular flow voids are stable and within normal limits. No
restricted diffusion to suggest acute infarction. No midline shift,
mass effect, evidence of mass lesion, ventriculomegaly, extra-axial
collection or acute intracranial hemorrhage. Cervicomedullary
junction within normal limits. Negative visualized cervical spine.
Stable and normal gray and white matter signal. No abnormal gray or
white matter enhancement.

Visualized orbit soft tissues are within normal limits. Visualized
paranasal sinuses and mastoids are clear. Grossly normal visualized
internal auditory structures. Visualized scalp soft tissues are
within normal limits. Visualized bone marrow signal is within normal
limits.

Dedicated pituitary imaging. Normal pituitary size in configuration.
Normal infundibulum. No suprasellar mass or mass effect. Normal
hypothalamus. Normal cavernous sinus. Dynamic and delayed pituitary
enhancement pattern within normal limits. No pituitary mass or
lesion.
IMPRESSION: 1. Normal MRI appearance of the pituitary.
2. Stable and normal MRI appearance of the brain.

## 2016-09-28 DIAGNOSIS — D2372 Other benign neoplasm of skin of left lower limb, including hip: Secondary | ICD-10-CM | POA: Diagnosis not present

## 2016-09-28 DIAGNOSIS — L821 Other seborrheic keratosis: Secondary | ICD-10-CM | POA: Diagnosis not present

## 2016-09-28 DIAGNOSIS — L308 Other specified dermatitis: Secondary | ICD-10-CM | POA: Diagnosis not present

## 2016-09-28 DIAGNOSIS — D2262 Melanocytic nevi of left upper limb, including shoulder: Secondary | ICD-10-CM | POA: Diagnosis not present

## 2016-12-01 ENCOUNTER — Encounter: Payer: Self-pay | Admitting: Obstetrics and Gynecology

## 2016-12-01 DIAGNOSIS — R5383 Other fatigue: Secondary | ICD-10-CM | POA: Diagnosis not present

## 2016-12-01 DIAGNOSIS — Z1231 Encounter for screening mammogram for malignant neoplasm of breast: Secondary | ICD-10-CM | POA: Diagnosis not present

## 2016-12-01 DIAGNOSIS — Z01419 Encounter for gynecological examination (general) (routine) without abnormal findings: Secondary | ICD-10-CM | POA: Diagnosis not present

## 2016-12-01 DIAGNOSIS — Z124 Encounter for screening for malignant neoplasm of cervix: Secondary | ICD-10-CM | POA: Diagnosis not present

## 2016-12-01 DIAGNOSIS — N912 Amenorrhea, unspecified: Secondary | ICD-10-CM | POA: Diagnosis not present

## 2016-12-01 LAB — HM PAP SMEAR: HM PAP: NEGATIVE

## 2016-12-01 LAB — HM MAMMOGRAPHY: HM MAMMO: NORMAL (ref 0–4)

## 2016-12-04 DIAGNOSIS — E559 Vitamin D deficiency, unspecified: Secondary | ICD-10-CM | POA: Insufficient documentation

## 2016-12-04 DIAGNOSIS — N912 Amenorrhea, unspecified: Secondary | ICD-10-CM | POA: Insufficient documentation

## 2017-03-23 ENCOUNTER — Ambulatory Visit (INDEPENDENT_AMBULATORY_CARE_PROVIDER_SITE_OTHER): Payer: BLUE CROSS/BLUE SHIELD | Admitting: Podiatry

## 2017-03-23 ENCOUNTER — Encounter: Payer: Self-pay | Admitting: Podiatry

## 2017-03-23 ENCOUNTER — Ambulatory Visit (INDEPENDENT_AMBULATORY_CARE_PROVIDER_SITE_OTHER): Payer: BLUE CROSS/BLUE SHIELD

## 2017-03-23 DIAGNOSIS — M722 Plantar fascial fibromatosis: Secondary | ICD-10-CM | POA: Diagnosis not present

## 2017-03-23 MED ORDER — METHYLPREDNISOLONE 4 MG PO TBPK: ORAL_TABLET | ORAL | 0 refills | Status: DC

## 2017-03-23 MED ORDER — MELOXICAM 15 MG PO TABS: 15.0000 mg | ORAL_TABLET | Freq: Every day | ORAL | 1 refills | Status: AC

## 2017-03-25 NOTE — Progress Notes (Signed)
   Subjective: Patient presents today for burning, pulling pain and tenderness in the left heel that began about one month ago. She reports associated medial ankle pain. She states the pain began shortly after getting new shoes. Walking and standing increases the pain. She has been taking Ibuprofen and icing the foot with minimal relief. Patient presents today for further treatment and evaluation.   Past Medical History:  Diagnosis Date  . Allergy   . Arthritis   . Asthma    exercise induced  . Back pain    bulging disc  . Back pain, chronic   . Cellulitis   . Eczema   . Endometritis   . Fatigue   . Headache   . Hernia    Umbilical  . History of chicken pox   . Insomnia   . Irregular menses   . Kidney stones   . Ovarian cyst   . Over weight   . Rectus diastasis   . Right hip pain   . Yeast infection      Objective: Physical Exam General: The patient is alert and oriented x3 in no acute distress.  Dermatology: Skin is warm, dry and supple bilateral lower extremities. Negative for open lesions or macerations bilateral.   Vascular: Dorsalis Pedis and Posterior Tibial pulses palpable bilateral.  Capillary fill time is immediate to all digits.  Neurological: Epicritic and protective threshold intact bilateral.   Musculoskeletal: Tenderness to palpation to the plantar aspect of the left heel along the plantar fascia. All other joints range of motion within normal limits bilateral. Strength 5/5 in all groups bilateral.    Assessment: 1. Plantar fasciitis left foot  Plan of Care:  1. Patient evaluated.  2. Injection of 0.5cc Celestone soluspan injected into the left plantar fascia.  3. Rx for Medrol Dose Pak placed 4. Rx for Mobic 15 mg ordered for patient. 5. Plantar fascial band(s) dispensed  6. Continue wearing custom molded orthotics.   7. Return to clinic in 4 weeks.     Felecia ShellingBrent M. Crockett Rallo, DPM [facility]  Dr. Felecia ShellingBrent M. Meng Winterton, DPM    [address]                                     [place], [place] [address]                Office [phone]  Fax [phone]

## 2017-04-23 ENCOUNTER — Ambulatory Visit: Payer: BLUE CROSS/BLUE SHIELD | Admitting: Podiatry

## 2017-04-23 ENCOUNTER — Encounter: Payer: Self-pay | Admitting: Podiatry

## 2017-04-23 DIAGNOSIS — M722 Plantar fascial fibromatosis: Secondary | ICD-10-CM

## 2017-04-26 NOTE — Progress Notes (Signed)
   Subjective: Patient presents today for follow up evaluation of plantar fasciitis of the left foot. She states the pain has improved but there is still some mild tenderness present. She has taken the Medrol Dose Pak and reports that, along with the injections, has helped to alleviate the pain. She also expresses interest in getting a new pair of orthotics. She states she had some in the past. Patient presents today for further treatment and evaluation.   Past Medical History:  Diagnosis Date  . Allergy   . Arthritis   . Asthma    exercise induced  . Back pain    bulging disc  . Back pain, chronic   . Cellulitis   . Eczema   . Endometritis   . Fatigue   . Headache   . Hernia    Umbilical  . History of chicken pox   . Insomnia   . Irregular menses   . Kidney stones   . Ovarian cyst   . Over weight   . Rectus diastasis   . Right hip pain   . Yeast infection      Objective: Physical Exam General: The patient is alert and oriented x3 in no acute distress.  Dermatology: Skin is warm, dry and supple bilateral lower extremities. Negative for open lesions or macerations bilateral.   Vascular: Dorsalis Pedis and Posterior Tibial pulses palpable bilateral.  Capillary fill time is immediate to all digits.  Neurological: Epicritic and protective threshold intact bilateral.   Musculoskeletal: Tenderness to palpation to the plantar aspect of the left heel along the plantar fascia. All other joints range of motion within normal limits bilateral. Strength 5/5 in all groups bilateral.    Assessment: 1. Plantar fasciitis left foot - improved  Plan of Care:  1. Patient evaluated.  2. Injection of 0.5cc Celestone soluspan injected into the left plantar fascia.  3. Continue wearing plantar fascial brace and taking Meloxicam.  4. Appointment with Raiford Nobleick for custom molded orthotics.  5. Patient just got a new pair of [employer] shoes.  6. Return to clinic in 4 weeks.   Very active. Plays  tennis.    Felecia ShellingBrent M. Evans, DPM [facility]  Dr. Felecia ShellingBrent M. Evans, DPM    [address]                                     [place], [place] [address]                Office [phone]  Fax [phone]

## 2017-04-28 ENCOUNTER — Ambulatory Visit (INDEPENDENT_AMBULATORY_CARE_PROVIDER_SITE_OTHER): Payer: BLUE CROSS/BLUE SHIELD | Admitting: Orthotics

## 2017-04-28 DIAGNOSIS — M79672 Pain in left foot: Secondary | ICD-10-CM

## 2017-04-28 DIAGNOSIS — M722 Plantar fascial fibromatosis: Secondary | ICD-10-CM | POA: Diagnosis not present

## 2017-04-28 NOTE — Progress Notes (Signed)

## 2017-05-19 ENCOUNTER — Ambulatory Visit: Payer: BLUE CROSS/BLUE SHIELD | Admitting: Orthotics

## 2017-05-19 DIAGNOSIS — M722 Plantar fascial fibromatosis: Secondary | ICD-10-CM

## 2017-05-19 NOTE — Progress Notes (Signed)
Patient came in today to pick up custom made foot orthotics.  The goals were accomplished and the patient reported no dissatisfaction with said orthotics.  Patient was advised of breakin period and how to report any issues. 

## 2017-05-25 ENCOUNTER — Encounter: Payer: BLUE CROSS/BLUE SHIELD | Admitting: Family Medicine

## 2017-06-01 ENCOUNTER — Ambulatory Visit (INDEPENDENT_AMBULATORY_CARE_PROVIDER_SITE_OTHER): Payer: BLUE CROSS/BLUE SHIELD | Admitting: Podiatry

## 2017-06-01 ENCOUNTER — Encounter: Payer: Self-pay | Admitting: Podiatry

## 2017-06-01 DIAGNOSIS — M722 Plantar fascial fibromatosis: Secondary | ICD-10-CM

## 2017-06-01 MED ORDER — METHYLPREDNISOLONE 4 MG PO TBPK: ORAL_TABLET | ORAL | 0 refills | Status: DC

## 2017-06-04 NOTE — Progress Notes (Signed)
   Subjective: 47 year old female presenting today for follow up evaluation of plantar fasciitis of the left foot. She states the pain is now worse and reports throbbing pain. She reports straining the foot last week while playing tennis. Walking increases the pain. She has been wearing the night splint, fascial brace and taking Meloxicam for treatment. Patient is here for further evaluation and treatment.   Past Medical History:  Diagnosis Date  . Allergy   . Arthritis   . Asthma    exercise induced  . Back pain    bulging disc  . Back pain, chronic   . Cellulitis   . Eczema   . Endometritis   . Fatigue   . Headache   . Hernia    Umbilical  . History of chicken pox   . Insomnia   . Irregular menses   . Kidney stones   . Ovarian cyst   . Over weight   . Rectus diastasis   . Right hip pain   . Yeast infection      Objective: Physical Exam General: The patient is alert and oriented x3 in no acute distress.  Dermatology: Skin is warm, dry and supple bilateral lower extremities. Negative for open lesions or macerations bilateral.   Vascular: Dorsalis Pedis and Posterior Tibial pulses palpable bilateral.  Capillary fill time is immediate to all digits.  Neurological: Epicritic and protective threshold intact bilateral.   Musculoskeletal: Tenderness to palpation to the plantar aspect of the left heel along the plantar fascia. All other joints range of motion within normal limits bilateral. Strength 5/5 in all groups bilateral.    Assessment: 1. Plantar fasciitis left foot  Plan of Care:  1. Patient evaluated.  2. Injection of 0.5cc Celestone soluspan injected into the left plantar fascia.  3. Prescription for Medrol Dose Pak provided to patient.  4. Continue taking Meloxicam. 5. Continue wearing custom molded orthotics.  6. Return to clinic as needed.    Very active. Plays tennis.    [name], DPM [facility]  Dr. Felecia Ellison, DPM      [address] [place], [place] [address]                Office [phone]  Fax [phone]

## 2017-06-09 ENCOUNTER — Ambulatory Visit: Payer: BLUE CROSS/BLUE SHIELD | Admitting: Medical

## 2017-06-09 ENCOUNTER — Encounter: Payer: Self-pay | Admitting: Medical

## 2017-06-09 VITALS — BP 121/66 | HR 64 | Temp 98.2°F | Wt 168.8 lb

## 2017-06-09 DIAGNOSIS — B029 Zoster without complications: Secondary | ICD-10-CM

## 2017-06-09 MED ORDER — VALACYCLOVIR HCL 1 G PO TABS: 1000.0000 mg | ORAL_TABLET | Freq: Three times a day (TID) | ORAL | 0 refills | Status: DC

## 2017-06-09 NOTE — Patient Instructions (Signed)
OTC Motrin or Tylenol  Take as directed for pain . OTC Zyrtec or Claritin  Daily for itching.   Shingles Shingles is an infection that causes a painful skin rash and fluid-filled blisters. Shingles is caused by the same virus that causes chickenpox. Shingles only develops in people who:  Have had chickenpox.  Have gotten the chickenpox vaccine. (This is rare.)  The first symptoms of shingles may be itching, tingling, or pain in an area on your skin. A rash will follow in a few days or weeks. The rash is usually on one side of the body in a bandlike or beltlike pattern. Over time, the rash turns into fluid-filled blisters that break open, scab over, and dry up. Medicines may:  Help you manage pain.  Help you recover more quickly.  Help to prevent long-term problems.  Follow these instructions at home: Medicines  Take medicines only as told by your doctor.  Apply an anti-itch or numbing cream to the affected area as told by your doctor. Blister and Rash Care  Take a cool bath or put cool compresses on the area of the rash or blisters as told by your doctor. This may help with pain and itching.  Keep your rash covered with a loose bandage (dressing). Wear loose-fitting clothing.  Keep your rash and blisters clean with mild soap and cool water or as told by your doctor.  Check your rash every day for signs of infection. These include redness, swelling, and pain that lasts or gets worse.  Do not pick your blisters.  Do not scratch your rash. General instructions  Rest as told by your doctor.  Keep all follow-up visits as told by your doctor. This is important.  Until your blisters scab over, your infection can cause chickenpox in people who have never had it or been vaccinated against it. To prevent this from happening, avoid touching other people or being around other people, especially: ? Babies. ? Pregnant women. ? Children who have eczema. ? Elderly people who have  transplants. ? People who have chronic illnesses, such as leukemia or AIDS. Contact a doctor if:  Your pain does not get better with medicine.  Your pain does not get better after the rash heals.  Your rash looks infected. Signs of infection include: ? Redness. ? Swelling. ? Pain that lasts or gets worse. Get help right away if:  The rash is on your face or nose.  You have pain in your face, pain around your eye area, or loss of feeling on one side of your face.  You have ear pain or you have ringing in your ear.  You have loss of taste.  Your condition gets worse. This information is not intended to replace advice given to you by your health care provider. Make sure you discuss any questions you have with your health care provider. Document Released: 06/24/2007 Document Revised: 09/01/2015 Document Reviewed: 10/17/2013 Elsevier Interactive Patient Education  [employer].

## 2017-06-09 NOTE — Progress Notes (Signed)
   Subjective:    Patient ID: [name], female    DOB: 1970-02-25, 47 y.o.   MRN: [id number]  HPI 47 yo female in non acute distress here for rash on right clacvicle area. Showed up about one week ago. Itchy, burning and stings but states there is no pain. Superior clavicle area. Stress with the end of the semester. Blood pressure 121/66, pulse 64, temperature 98.2 F (36.8 C), weight 168 lb 12.8 oz (76.6 kg), SpO2 98 %.    Review of Systems  Constitutional: Negative for chills, fatigue and fever.  HENT: Negative for congestion, ear pain and sore throat.   Eyes: Negative for pain, discharge and itching.  Respiratory: Negative for cough and shortness of breath.   Cardiovascular: Negative for chest pain, palpitations and leg swelling.  Gastrointestinal: Negative for abdominal distention, abdominal pain, diarrhea, nausea and vomiting.  Endocrine: Negative for polydipsia, polyphagia and polyuria.  Genitourinary: Negative for dysuria.  Musculoskeletal: Negative for myalgias.  Skin: Positive for rash (claricular area right).  Allergic/Immunologic: Positive for environmental allergies. Negative for food allergies.  Neurological: Negative for dizziness, syncope and light-headedness.  Hematological: Negative for adenopathy.  Psychiatric/Behavioral: Negative for behavioral problems, self-injury and suicidal ideas. The patient is not nervous/anxious.    Used inhaler  2 times per weeks possible allergies. Heart flutter with catching breath lying on cough, happened one time.  Taking meloxicam for plantar fasciitus.  Was on steroids finished yesterday.    Objective:   Physical Exam  Constitutional: She is oriented to person, place, and time. She appears well-developed and well-nourished.  Eyes: Pupils are equal, round, and reactive to light. Conjunctivae and EOM are normal.  Neurological: She is alert and oriented to person, place, and time.  Skin: Skin is dry. Rash (located superior  clavicle area , base of neck blistering with erythematous base.) noted.  Psychiatric: She has a normal mood and affect. Her behavior is normal. Judgment and thought content normal.  Nursing note and vitals reviewed.          Assessment & Plan:  Herpes Zoster right clavicle area. Reviewed with Dr. Sullivan [place] location agrees on precautions and plan..   Meds ordered this encounter  Medications  . valACYclovir (VALTREX) 1000 MG tablet    Sig: Take 1 tablet (1,000 mg total) by mouth 3 (three) times daily.    Dispense:  21 tablet    Refill:  0   Recheck in one week , if on face return to clinic sooner. Patient verbalizes understanding and has no questions at discharge. Patient not in any pain now, discussed pain control with ibuprofen or Tylenol take as directed. Contact the office if these do no help with pain.

## 2017-06-16 ENCOUNTER — Ambulatory Visit: Payer: BLUE CROSS/BLUE SHIELD | Admitting: Medical

## 2017-06-17 ENCOUNTER — Ambulatory Visit: Payer: BLUE CROSS/BLUE SHIELD | Admitting: Adult Health

## 2017-06-17 ENCOUNTER — Encounter: Payer: Self-pay | Admitting: Adult Health

## 2017-06-17 VITALS — BP 117/56 | HR 76 | Temp 98.4°F | Resp 16 | Wt 172.6 lb

## 2017-06-17 DIAGNOSIS — N946 Dysmenorrhea, unspecified: Secondary | ICD-10-CM | POA: Insufficient documentation

## 2017-06-17 DIAGNOSIS — B029 Zoster without complications: Secondary | ICD-10-CM

## 2017-06-17 DIAGNOSIS — N951 Menopausal and female climacteric states: Secondary | ICD-10-CM | POA: Insufficient documentation

## 2017-06-17 DIAGNOSIS — N926 Irregular menstruation, unspecified: Secondary | ICD-10-CM | POA: Insufficient documentation

## 2017-06-17 DIAGNOSIS — Z889 Allergy status to unspecified drugs, medicaments and biological substances status: Secondary | ICD-10-CM | POA: Insufficient documentation

## 2017-06-17 DIAGNOSIS — Z98891 History of uterine scar from previous surgery: Secondary | ICD-10-CM | POA: Insufficient documentation

## 2017-06-17 NOTE — Progress Notes (Signed)
Subjective:    Patient ID: [name], female    DOB: Jan 22, 1970, 47 y.o.   MRN: [id number]  Blood pressure (!) 117/56, pulse 76, temperature 98.4 F (36.9 C), temperature source Tympanic, resp. rate 16, weight 172 lb 9.6 oz (78.3 kg), SpO2 98 %.  HPI Patient is 47 yo female in no acute distress here for rash follow up on right clacvicle area. Rash showed up about two  weeks ago. It was itchy and burning at onset however with Valcyclovir treatment given at last visit this has now resolved.  Superior clavicle area is now improvedsignificantly per patient. Stress with the end of the semester has now resolved as graduation is over.   She was diagnosed with Herpes Zoster and treated - she reports no facial involvement ever occurred.   Patient  denies any fever, body aches,chills, any new rash,  chest pain, shortness of breath, nausea, vomiting, or diarrhea.     Review of Systems  Constitutional: Negative for chills, fatigue and fever.  HENT: Negative for congestion, ear pain and sore throat.   Eyes: Negative for pain, discharge and itching.  Respiratory: Negative for cough and shortness of breath.   Cardiovascular: Negative for chest pain, palpitations and leg swelling.  Gastrointestinal: Negative for abdominal distention, abdominal pain, diarrhea, nausea and vomiting.  Endocrine: Negative for polydipsia, polyphagia and polyuria.  Genitourinary: Negative for dysuria.  Musculoskeletal: Negative for myalgias.  Skin: Positive for rash (clavicular area right appears to be dried out and crusted over) patient reports huge improvement since last visit.  Allergic/Immunologic: Positive for environmental allergies. Negative for food allergies.  Neurological: Negative for dizziness, syncope and light-headedness.  Hematological: Negative for adenopathy.  Psychiatric/Behavioral: Negative for behavioral problems, self-injury and suicidal ideas. The patient is not nervous/anxious.   Taking  meloxicam for achilles tendonitis. She was on steroids finished over one week ago. Walking with crutches currently.     Objective:   Physical Exam  Constitutional: She is oriented to person, place, and time. She appears well-developed and well-nourished.  Eyes: Pupils are equal, round, and reactive to light. Conjunctivae and EOM are normal.  Neurological: She is alert and oriented to person, place, and time.  Skin: Skin is dry. Rash (located superior clavicle area , base of neck dried and crusted with mostly  normal underlying skin tone- scattered pink area in occasional area noted)  Psychiatric: She has a normal mood and affect. Her behavior is normal. Judgment and thought content normal.  Nursing note and vitals reviewed.  She denies any other rash.        Assessment & Plan:  Herpes Zoster right clavicle area resolving.  Herpes zoster without complication RESOLVING.   Finish prescribed medications.  Return to office by appointment if any rash reoccurs or any new symptoms occur.    Current Outpatient Medications:  .  clobetasol ointment (TEMOVATE) 0.05 %, APPLY 1 APPLICATION TO SKIN TWICE DAILY, Disp: , Rfl: 4 .  valACYclovir (VALTREX) 1000 MG tablet, Take 1 tablet (1,000 mg total) by mouth 3 (three) times daily., Disp: 21 tablet, Rfl: 0 .  NONFORMULARY OR COMPOUNDED ITEM, Achilles Tendonitis Cream-[facility] 2 refills, Disp: , Rfl:   Advised patient call the office or your primary care doctor for an appointment if no improvement within 72 hours or if any symptoms change or worsen at any time  Advised ER or urgent Care if after hours or on weekend. Call 911 for emergency symptoms at any time.Patinet verbalized understanding of all  instructions given/reviewed and treatment plan and has no further questions or concerns at this time.    Patient verbalized understanding of all instructions given and denies any further questions at this time.

## 2017-08-23 DIAGNOSIS — M722 Plantar fascial fibromatosis: Secondary | ICD-10-CM | POA: Diagnosis not present

## 2017-08-25 ENCOUNTER — Ambulatory Visit (INDEPENDENT_AMBULATORY_CARE_PROVIDER_SITE_OTHER): Payer: BLUE CROSS/BLUE SHIELD | Admitting: Family Medicine

## 2017-08-25 ENCOUNTER — Encounter: Payer: Self-pay | Admitting: Family Medicine

## 2017-08-25 VITALS — BP 116/64 | HR 84 | Temp 97.9°F | Resp 16 | Ht 61.0 in | Wt 171.6 lb

## 2017-08-25 DIAGNOSIS — R87810 Cervical high risk human papillomavirus (HPV) DNA test positive: Secondary | ICD-10-CM

## 2017-08-25 DIAGNOSIS — Z13220 Encounter for screening for lipoid disorders: Secondary | ICD-10-CM | POA: Diagnosis not present

## 2017-08-25 DIAGNOSIS — M722 Plantar fascial fibromatosis: Secondary | ICD-10-CM

## 2017-08-25 DIAGNOSIS — Z131 Encounter for screening for diabetes mellitus: Secondary | ICD-10-CM

## 2017-08-25 DIAGNOSIS — R1313 Dysphagia, pharyngeal phase: Secondary | ICD-10-CM

## 2017-08-25 DIAGNOSIS — Z01419 Encounter for gynecological examination (general) (routine) without abnormal findings: Secondary | ICD-10-CM

## 2017-08-25 DIAGNOSIS — R7989 Other specified abnormal findings of blood chemistry: Secondary | ICD-10-CM

## 2017-08-25 DIAGNOSIS — E229 Hyperfunction of pituitary gland, unspecified: Secondary | ICD-10-CM | POA: Diagnosis not present

## 2017-08-25 NOTE — Patient Instructions (Signed)
Preventive Care 40-64 Years, Female Preventive care refers to lifestyle choices and visits with your health care provider that can promote health and wellness. What does preventive care include?  A yearly physical exam. This is also called an annual well check.  Dental exams once or twice a year.  Routine eye exams. Ask your health care provider how often you should have your eyes checked.  Personal lifestyle choices, including: ? Daily care of your teeth and gums. ? Regular physical activity. ? Eating a healthy diet. ? Avoiding tobacco and drug use. ? Limiting alcohol use. ? Practicing safe sex. ? Taking low-dose aspirin daily starting at age 58. ? Taking vitamin and mineral supplements as recommended by your health care provider. What happens during an annual well check? The services and screenings done by your health care provider during your annual well check will depend on your age, overall health, lifestyle risk factors, and family history of disease. Counseling Your health care provider may ask you questions about your:  Alcohol use.  Tobacco use.  Drug use.  Emotional well-being.  Home and relationship well-being.  Sexual activity.  Eating habits.  Work and work Statistician.  Method of birth control.  Menstrual cycle.  Pregnancy history.  Screening You may have the following tests or measurements:  Height, weight, and BMI.  Blood pressure.  Lipid and cholesterol levels. These may be checked every 5 years, or more frequently if you are over 81 years old.  Skin check.  Lung cancer screening. You may have this screening every year starting at age 78 if you have a 30-pack-year history of smoking and currently smoke or have quit within the past 15 years.  Fecal occult blood test (FOBT) of the stool. You may have this test every year starting at age 65.  Flexible sigmoidoscopy or colonoscopy. You may have a sigmoidoscopy every 5 years or a colonoscopy  every 10 years starting at age 30.  Hepatitis C blood test.  Hepatitis B blood test.  Sexually transmitted disease (STD) testing.  Diabetes screening. This is done by checking your blood sugar (glucose) after you have not eaten for a while (fasting). You may have this done every 1-3 years.  Mammogram. This may be done every 1-2 years. Talk to your health care provider about when you should start having regular mammograms. This may depend on whether you have a family history of breast cancer.  BRCA-related cancer screening. This may be done if you have a family history of breast, ovarian, tubal, or peritoneal cancers.  Pelvic exam and Pap test. This may be done every 3 years starting at age 80. Starting at age 36, this may be done every 5 years if you have a Pap test in combination with an HPV test.  Bone density scan. This is done to screen for osteoporosis. You may have this scan if you are at high risk for osteoporosis.  Discuss your test results, treatment options, and if necessary, the need for more tests with your health care provider. Vaccines Your health care provider may recommend certain vaccines, such as:  Influenza vaccine. This is recommended every year.  Tetanus, diphtheria, and acellular pertussis (Tdap, Td) vaccine. You may need a Td booster every 10 years.  Varicella vaccine. You may need this if you have not been vaccinated.  Zoster vaccine. You may need this after age 5.  Measles, mumps, and rubella (MMR) vaccine. You may need at least one dose of MMR if you were born in  1957 or later. You may also need a second dose.  Pneumococcal 13-valent conjugate (PCV13) vaccine. You may need this if you have certain conditions and were not previously vaccinated.  Pneumococcal polysaccharide (PPSV23) vaccine. You may need one or two doses if you smoke cigarettes or if you have certain conditions.  Meningococcal vaccine. You may need this if you have certain  conditions.  Hepatitis A vaccine. You may need this if you have certain conditions or if you travel or work in places where you may be exposed to hepatitis A.  Hepatitis B vaccine. You may need this if you have certain conditions or if you travel or work in places where you may be exposed to hepatitis B.  Haemophilus influenzae type b (Hib) vaccine. You may need this if you have certain conditions.  Talk to your health care provider about which screenings and vaccines you need and how often you need them. This information is not intended to replace advice given to you by your health care provider. Make sure you discuss any questions you have with your health care provider. Document Released: 02/01/2015 Document Revised: 09/25/2015 Document Reviewed: 11/06/2014 Elsevier Interactive Patient Education  2018 Elsevier Inc.  

## 2017-08-25 NOTE — Progress Notes (Signed)
Name: [name]   MRN: [id number]    DOB: Nov 20, 1970   Date:08/25/2017       Progress Note  Subjective  Chief Complaint  Chief Complaint  Patient presents with  . Annual Exam    Sees [facility] for GYN needs-had a recent pap and mammogram    HPI   Patient presents for annual CPE and follow up  Plantar fascitis: seen by Podiatrist - going on since 2017, now going to [employer], affecting her ability to play tennis and is getting upset about it, discussed k-laser and she will contact Dr. 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 Discussed IUD. Recheck prolactin level, normal MRI in 2015, no headaches  Dysphagia: she has noticed difficulty swallowing over the past year. She states food gets stuck and has to relax to either regurgitate or food goes down. No pain. It is happening more frequently, only with solids, not monthly. No weight loss, or blood in stools. She has been eating smaller bites to avoid symptoms. Normal appetite. She denies heartburn.   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   Exercise: not as active because of plantar fascitis   USPSTF grade A and B recommendations    Office Visit from 08/25/2017 in [facility]  AUDIT-C Score  0     Depression:  Depression screen [facility] 2/9 08/25/2017 10/10/2014  Decreased Interest 0 0  Down, Depressed, Hopeless 0 0  PHQ - 2 Score 0 0   Hypertension: BP Readings from Last 3 Encounters:  08/25/17 116/64  06/17/17 (!) 117/56  06/09/17 121/66   Obesity: Wt Readings from Last 3 Encounters:  08/25/17 171 lb 9.6 oz (77.8 kg)  06/17/17 172 lb 9.6 oz (78.3 kg)  06/09/17 168 lb 12.8 oz (76.6 kg)   BMI Readings from Last 3 Encounters:  08/25/17 32.42 kg/m   06/17/17 32.61 kg/m  06/09/17 31.89 kg/m     STD testing and prevention (HIV/chl/gon/syphilis): N/A Intimate partner violence: negative screen  Sexual History/Pain during Intercourse: no pain  Menstrual History/LMP/Abnormal Bleeding: irregular cycles, seeing GYN  Incontinence Symptoms: no symptoms. None   Advanced Care Planning: A voluntary discussion about advance care planning including the explanation and discussion of advance directives.  Discussed health care proxy and Living will, and the patient was able to identify a health care proxy as husband.  Patient does have a living will at present time.  Breast cancer:  HM Mammogram  Date Value Ref Range Status  10/31/2015 Self Reported Normal 0-4 Bi-Rad, Self Reported Normal Final    Comment:    [facility] OB-Benign Findings-1 year    BRCA gene screening: mother and maternal aunt have a history of breast cancer, she will ask her mother to have genetic testing  Cervical cancer screening: yearly with gyn    Lipids:  Lab Results  Component Value Date   CHOL 183 05/19/2016   CHOL 186 10/10/2014   Lab Results  Component Value Date   HDL 72 05/19/2016   HDL 63 10/10/2014   Lab Results  Component Value Date   LDLCALC 97 05/19/2016   [place] 95 10/10/2014   Lab Results  Component Value Date   TRIG 72 05/19/2016   TRIG 142 10/10/2014  Lab Results  Component Value Date   CHOLHDL 2.5 05/19/2016   CHOLHDL 3.0 10/10/2014   No results found for: LDLDIRECT  Glucose:  Glucose, Bld  Date Value Ref Range Status  05/19/2016 78 65 - 99 mg/dL Final    Skin cancer: discussed atypical lesions Colorectal cancer: start at age 3    Patient Active Problem List   Diagnosis Date Noted  . Menopausal symptom 06/17/2017  . Irregular periods 06/17/2017  . Dysmenorrhea 06/17/2017  . Drug allergy 06/17/2017  . History of cesarean section 06/17/2017  . Vitamin D deficiency 12/04/2016  . Plantar fasciitis, left 05/19/2016   . Cervical high risk HPV (human papillomavirus) test positive 11/04/2015  . Allergic rhinitis 10/10/2014  . Back pain, chronic 10/10/2014  . Clinical depression 10/10/2014  . Dermatitis, eczematoid 10/10/2014  . Elevated prolactin level ([place]) 10/10/2014  . Insomnia 10/10/2014  . Umbilical hernia without obstruction and without gangrene 10/10/2014  . H/O allergy to multiple drugs 02/22/2012  . Blood type, Rh negative 02/22/2012  . Previous cesarean delivery x 4 02/22/2012  . Personal history of kidney stones 02/22/2012    Past Surgical History:  Procedure Laterality Date  . CESAREAN SECTION  2003,2005,2008,2010  . FOOT SURGERY     neuroma left foot  . tummy tuck  05/31/2014  . UMBILICAL HERNIA REPAIR  05/31/2014  . VENTRAL HERNIA REPAIR  05/31/2014  . WISDOM TOOTH EXTRACTION      Family History  Problem Relation Age of Onset  . Cancer Mother 61       breast cancer  . Osteoarthritis Mother   . Broken bones Mother        Hip x2  . Hyperlipidemia Father   . Hypertension Father   . Heart attack Father   . Hypertension Paternal Grandmother   . Congenital heart disease Paternal Grandmother   . Stroke Paternal Grandfather   . Kidney disease Daughter   . Hypercholesterolemia Brother   . Colon cancer Maternal Grandmother   . Heart disease Maternal Grandfather   . Hypertension Sister   . Migraines Sister   . Thyroid disease Sister   . GI problems Sister        Extra kink in her large intestine  . Hypercholesterolemia Brother     Social History   Socioeconomic History  . Marital status: Married    Spouse name: Not on file  . Number of children: 4  . Years of education: Not on file  . Highest education level: Doctorate  Occupational History  . Occupation: Professor    Fish farm manager: [employer]    Comment: [place]  . Financial resource strain: Not hard at all  . Food insecurity:    Worry: Never true    Inability: Never true  . Transportation needs:     Medical: No    Non-medical: No  Tobacco Use  . Smoking status: Never Smoker  . Smokeless tobacco: Never Used  Substance and Sexual Activity  . Alcohol use: No  . Drug use: No  . Sexual activity: Yes    Partners: Male    Birth control/protection: Rhythm    Comment: married   Lifestyle  . Physical activity:    Days per week: 3 days    Minutes per session: 150+ min  . Stress: Not at all  Relationships  . Social connections:    Talks on phone: More than three times a week    Gets together: More than three times a week  Attends religious service: More than 4 times per year    Active member of club or organization: Yes    Attends meetings of clubs or organizations: More than 4 times per year    Relationship status: Married  . Intimate partner violence:    Fear of current or ex partner: No    Emotionally abused: No    Physically abused: No    Forced sexual activity: No  Other Topics Concern  . Not on file  Social History Narrative   Married, she has 4 daughters   She works as Development worker, international aid at [employer], and is on leadership role and teaching     Current Outpatient Medications:  .  clobetasol ointment (TEMOVATE) 9.62 %, APPLY 1 APPLICATION TO SKIN TWICE DAILY, Disp: , Rfl: 4 .  NONFORMULARY OR COMPOUNDED ITEM, Achilles Tendonitis Cream-[facility] 2 refills, Disp: , Rfl:   Allergies  Allergen Reactions  . Erythromycin Other (See Comments)    Severe abdominal pain  . Penicillins Hives     ROS  Constitutional: Negative for fever , positive for  weight change.  Respiratory: Negative for cough and shortness of breath.   Cardiovascular: Negative for chest pain or palpitations.  Gastrointestinal: Negative for abdominal pain, no bowel changes.  Musculoskeletal: Negative for gait problem or joint swelling.  Skin: Negative for rash.  Neurological: Negative for dizziness or headache.  No other specific complaints in a complete review of systems (except as listed in  HPI above).  Objective  Vitals:   08/25/17 0931  BP: 116/64  Pulse: 84  Resp: 16  Temp: 97.9 F (36.6 C)  TempSrc: Oral  SpO2: 95%  Weight: 171 lb 9.6 oz (77.8 kg)  Height: '5\' 1"'  (1.549 m)    Body mass index is 32.42 kg/m.  Physical Exam  Constitutional: Patient appears well-developed and obese . No distress.  HENT: Head: Normocephalic and atraumatic. Ears: B TMs ok, no erythema or effusion; Nose: Nose normal. Mouth/Throat: Oropharynx is clear and moist. No oropharyngeal exudate.  Eyes: Conjunctivae and EOM are normal. Pupils are equal, round, and reactive to light. No scleral icterus.  Neck: Normal range of motion. Neck supple. No JVD present. No thyromegaly present.  Cardiovascular: Normal rate, regular rhythm and normal heart sounds.  No murmur heard. No BLE edema. Pulmonary/Chest: Effort normal and breath sounds normal. No respiratory distress. Abdominal: Soft. Bowel sounds are normal, no distension. There is no tenderness. no masses Breast: no lumps or masses, no nipple discharge or rashes FEMALE GENITALIA: sees gyn  RECTAL: not done Musculoskeletal: Normal range of motion, no joint effusions. No gross deformities Neurological: he is alert and oriented to person, place, and time. No cranial nerve deficit. Coordination, balance, strength, speech and gait are normal.  Skin: Skin is warm and dry. No rash noted. No erythema.  Psychiatric: Patient has a normal mood and affect. behavior is normal. Judgment and thought content normal.  PHQ2/9: Depression screen [facility] 2/9 08/25/2017 10/10/2014  Decreased Interest 0 0  Down, Depressed, Hopeless 0 0  PHQ - 2 Score 0 0     Fall Risk: Fall Risk  08/25/2017 10/10/2014  Falls in the past year? No No     Functional Status Survey: Is the patient deaf or have difficulty hearing?: No Does the patient have difficulty seeing, even when wearing glasses/contacts?: No Does the patient have difficulty concentrating, remembering, or making  decisions?: No Does the patient have difficulty walking or climbing stairs?: No Does the patient have difficulty  dressing or bathing?: No Does the patient have difficulty doing errands alone such as visiting a doctor's office or shopping?: No  Assessment & Plan  1. Well woman exam  She gets mammogram at [place] GFR - CBC with Differential/Platelet - Hemoglobin A1c - Prolactin - Lipid panel  2. Elevated prolactin level ([facility])  - Prolactin  3. Diabetes mellitus screening  - Hemoglobin A1c  4. Lipid screening  - Lipid panel  5. Plantar fasciitis, left  Discussed k-laser   6. 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 Pharyngeal dysphagia  - Ambulatory referral to Gastroenterology   -USPSTF grade A and B recommendations reviewed with patient; age-appropriate recommendations, preventive care, screening tests, etc discussed and encouraged; healthy living encouraged; see AVS for patient education given to patient -Discussed importance of 150 minutes of physical activity weekly, eat two servings of fish weekly, eat one serving of tree nuts ( cashews, pistachios, pecans, almonds.Marland Kitchen) every other day, eat 6 servings of fruit/vegetables daily and drink plenty of water and avoid sweet beverages.

## 2017-08-26 ENCOUNTER — Encounter: Payer: Self-pay | Admitting: Family Medicine

## 2017-08-26 LAB — CBC WITH DIFFERENTIAL/PLATELET
Basophils Absolute: 37 cells/uL (ref 0–200)
Basophils Relative: 0.5 %
EOS PCT: 4.1 %
Eosinophils Absolute: 299 cells/uL (ref 15–500)
HCT: 41.6 % (ref 35.0–45.0)
Hemoglobin: 14.2 g/dL (ref 11.7–15.5)
Lymphs Abs: 1737 cells/uL (ref 850–3900)
MCH: 30 pg (ref 27.0–33.0)
MCHC: 34.1 g/dL (ref 32.0–36.0)
MCV: 87.8 fL (ref 80.0–100.0)
MPV: 10.1 fL (ref 7.5–12.5)
Monocytes Relative: 7.3 %
Neutro Abs: 4694 cells/uL (ref 1500–7800)
Neutrophils Relative %: 64.3 %
PLATELETS: 273 10*3/uL (ref 140–400)
RBC: 4.74 10*6/uL (ref 3.80–5.10)
RDW: 12.5 % (ref 11.0–15.0)
TOTAL LYMPHOCYTE: 23.8 %
WBC mixed population: 533 cells/uL (ref 200–950)
WBC: 7.3 10*3/uL (ref 3.8–10.8)

## 2017-08-26 LAB — LIPID PANEL
CHOL/HDL RATIO: 3.1 (calc) (ref <5.0)
Cholesterol: 176 mg/dL (ref <200)
HDL: 56 mg/dL (ref >50)
LDL Cholesterol (Calc): 104 mg/dL (calc) — ABNORMAL HIGH
NON-HDL CHOLESTEROL (CALC): 120 mg/dL (ref <130)
TRIGLYCERIDES: 69 mg/dL (ref <150)

## 2017-08-26 LAB — COMPLETE METABOLIC PANEL WITH GFR
AG Ratio: 1.7 (calc) (ref 1.0–2.5)
ALBUMIN MSPROF: 4.5 g/dL (ref 3.6–5.1)
ALT: 12 U/L (ref 6–29)
AST: 17 U/L (ref 10–35)
Alkaline phosphatase (APISO): 75 U/L (ref 33–115)
BILIRUBIN TOTAL: 0.5 mg/dL (ref 0.2–1.2)
BUN: 21 mg/dL (ref 7–25)
CHLORIDE: 105 mmol/L (ref 98–110)
CO2: 24 mmol/L (ref 20–32)
Calcium: 9.3 mg/dL (ref 8.6–10.2)
Creat: 0.72 mg/dL (ref 0.50–1.10)
GFR, Est African American: 116 mL/min/{1.73_m2} (ref >=60)
GFR, Est Non African American: 100 mL/min/{1.73_m2} (ref >=60)
GLOBULIN: 2.6 g/dL (ref 1.9–3.7)
GLUCOSE: 88 mg/dL (ref 65–99)
POTASSIUM: 4.5 mmol/L (ref 3.5–5.3)
SODIUM: 138 mmol/L (ref 135–146)
Total Protein: 7.1 g/dL (ref 6.1–8.1)

## 2017-08-26 LAB — HEMOGLOBIN A1C
EAG (MMOL/L): 5.2 (calc)
Hgb A1c MFr Bld: 4.9 % of total Hgb (ref <5.7)
MEAN PLASMA GLUCOSE: 94 (calc)

## 2017-08-26 LAB — PROLACTIN: Prolactin: 4.7 ng/mL

## 2017-08-30 ENCOUNTER — Encounter: Payer: Self-pay | Admitting: Family Medicine

## 2017-09-07 ENCOUNTER — Encounter: Payer: Self-pay | Admitting: Podiatry

## 2017-09-07 NOTE — Progress Notes (Signed)
Patients office visit notes from 14 November 2015 - Present were released to her via MyChart as I was unaware if records were given to the patient [facility] when she signed the consent form.

## 2017-09-08 ENCOUNTER — Telehealth: Payer: Self-pay | Admitting: Podiatry

## 2017-09-08 NOTE — Telephone Encounter (Signed)
I called the pt to let her know I released her requested notes to her through MyChart. I asked if she still needed a physical copy and pt stated she got them the day she went into the office to fill out and sign the medical records release form.

## 2017-10-04 DIAGNOSIS — L308 Other specified dermatitis: Secondary | ICD-10-CM | POA: Diagnosis not present

## 2017-10-04 DIAGNOSIS — L821 Other seborrheic keratosis: Secondary | ICD-10-CM | POA: Diagnosis not present

## 2017-10-04 DIAGNOSIS — D2261 Melanocytic nevi of right upper limb, including shoulder: Secondary | ICD-10-CM | POA: Diagnosis not present

## 2017-10-04 DIAGNOSIS — D2362 Other benign neoplasm of skin of left upper limb, including shoulder: Secondary | ICD-10-CM | POA: Diagnosis not present

## 2017-10-08 ENCOUNTER — Other Ambulatory Visit: Payer: Self-pay

## 2017-10-08 ENCOUNTER — Encounter: Payer: Self-pay | Admitting: Gastroenterology

## 2017-10-08 ENCOUNTER — Ambulatory Visit: Payer: BLUE CROSS/BLUE SHIELD | Admitting: Gastroenterology

## 2017-10-08 VITALS — BP 113/79 | HR 63 | Resp 16 | Ht 61.0 in | Wt 172.8 lb

## 2017-10-08 DIAGNOSIS — R1310 Dysphagia, unspecified: Secondary | ICD-10-CM | POA: Diagnosis not present

## 2017-10-08 DIAGNOSIS — R1319 Other dysphagia: Secondary | ICD-10-CM

## 2017-10-08 DIAGNOSIS — R1313 Dysphagia, pharyngeal phase: Secondary | ICD-10-CM

## 2017-10-08 MED ORDER — OMEPRAZOLE 40 MG PO CPDR: 40.0000 mg | DELAYED_RELEASE_CAPSULE | Freq: Two times a day (BID) | ORAL | 1 refills | Status: DC

## 2017-10-08 NOTE — Progress Notes (Signed)
Gina Repressohini R Vanga, MD [address]  [address]  [place], [place] [address]  Main: [phone]  Fax: [phone]    Gastroenterology Consultation  Referring Provider:     Alba Ellison, Krichna, MD Primary Care Physician:  [name], Krichna, MD Primary Gastroenterologist:  Dr. Arlyss Repressohini R Ellison Reason for Consultation:     Dysphagia to solids        HPI:   [name] is a 47 y.o. female referred by Dr. Alba Ellison, Krichna, MD  for consultation & management of chronic dysphagia to solids. Patient reports several years history of difficulty swallowing, food stuck in her throat.Several years ago, she had a choking episode and her husband had to perform he makes manner. Since then, she is been experiencing difficulty swallowing particularly to solid food and has become worse lately. also reports constant clearing of throat, denies heartburn, regurgitation, epigastric pain or dysphagia to liquids. For the last 1-2 months, she has been going through significant stress at work. 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 She is not on any PPI. She feels that her esophagus is not moving at all. She also notices that her stools become looser during episodes of stress otherwise having regular bowel movements. Her weight has been stable, rather gained a few pounds in last 1 year She does not smoke or drink alcohol She also reports eczema which gets worse with stress  NSAIDs: none  Antiplts/Anticoagulants/Anti thrombotics: none  GI Procedures: none Paternal grandmother with colon cancer in her 5880s She denies any GI surgeries  Past Medical History:  Diagnosis Date  . Allergy   . Arthritis   . Asthma    exercise induced  . Back pain    bulging disc  . Back pain, chronic   . Cellulitis   . Eczema   . Endometritis   . Fatigue   . Headache   . Hernia    Umbilical  . History of chicken pox   . Insomnia   . Irregular menses   . Kidney stones   . Ovarian cyst   . Over weight   . Rectus diastasis     . Right hip pain   . Yeast infection     Past Surgical History:  Procedure Laterality Date  . CESAREAN SECTION  2003,2005,2008,2010  . FOOT SURGERY     neuroma left foot  . tummy tuck  05/31/2014  . UMBILICAL HERNIA REPAIR  05/31/2014  . VENTRAL HERNIA REPAIR  05/31/2014  . WISDOM TOOTH EXTRACTION      Current Outpatient Medications:  .  betamethasone dipropionate (DIPROLENE) 0.05 % ointment, , Disp: , Rfl: 2 .  clobetasol ointment (TEMOVATE) 0.05 %, APPLY 1 APPLICATION TO SKIN TWICE DAILY, Disp: , Rfl: 4 .  NONFORMULARY OR COMPOUNDED ITEM, Achilles Tendonitis [facility] 2 refills, Disp: , Rfl:  .  omeprazole (PRILOSEC) 40 MG capsule, Take 1 capsule (40 mg total) by mouth 2 (two) times daily., Disp: 60 capsule, Rfl: 1   Family History  Problem Relation Age of Onset  . Cancer Mother 1370       breast cancer  . Osteoarthritis Mother   . Broken bones Mother        Hip x2  . Hyperlipidemia Father   . Hypertension Father   . Heart attack Father   . Hypertension Paternal Grandmother   . Congenital heart disease Paternal Grandmother   . Stroke Paternal Grandfather   . Kidney disease Daughter   . Hypercholesterolemia Brother   . Colon  cancer Maternal Grandmother   . Heart disease Maternal Grandfather   . Hypertension Sister   . Migraines Sister   . Thyroid disease Sister   . GI problems Sister        Extra kink in her large intestine  . Hypercholesterolemia Brother      Social History   Tobacco Use  . Smoking status: Never Smoker  . Smokeless tobacco: Never Used  Substance Use Topics  . Alcohol use: No  . Drug use: No    Allergies as of 10/08/2017 - Review Complete 10/08/2017  Allergen Reaction Noted  . Erythromycin Other (See Comments) 07/01/2011  . Penicillins Hives 07/01/2011    Review of Systems:    All systems reviewed and negative except where noted in HPI.   Physical Exam:  BP 113/79 (BP Location: Left Arm, Patient Position: Sitting, Cuff  Size: Large)   Pulse 63   Resp 16   Ht 5\' 1"  (1.549 m)   Wt 172 lb 12.8 oz (78.4 kg)   BMI 32.65 kg/m  No LMP recorded. (Menstrual status: Irregular Periods).  General:   Alert,  Well-developed, well-nourished, pleasant and cooperative in NAD Head:  Normocephalic and atraumatic. Eyes:  Sclera clear, no icterus.   Conjunctiva pink. Ears:  Normal auditory acuity. Nose:  No deformity, discharge, or lesions. Mouth:  No deformity or lesions,oropharynx pink & moist. Neck:  Supple; no masses or thyromegaly. Lungs:  Respirations even and unlabored.  Clear throughout to auscultation.   No wheezes, crackles, or rhonchi. No acute distress. Heart:  Regular rate and rhythm; no murmurs, clicks, rubs, or gallops. Abdomen:  Normal bowel sounds. Soft, non-tender and non-distended without masses, hepatosplenomegaly or hernias noted.  No guarding or rebound tenderness.   Rectal: Not performed Msk:  Symmetrical without gross deformities. Good, equal movement & strength bilaterally. Pulses:  Normal pulses noted. Extremities:  No clubbing or edema.  No cyanosis. Neurologic:  Alert and oriented x3;  grossly normal neurologically. Skin:  Intact without significant lesions or rashes. No jaundice. Lymph Nodes:  No significant cervical adenopathy. Psych:  Alert and cooperative. Normal mood and affect.  Imaging Studies: No recent abdominal imaging  Assessment and Plan:   [name] is a 47 y.o. Caucasian female with no significant past medical history, obesity, BMI 32 with chronic progressive dysphagia to solids and worse during episodes of stress  Recommend EGD with esophageal biopsies to rule out EoE or other structural causes Start omeprazole 20 mg twice daily before meals   Follow up in four weeks   Gina Repress, MD

## 2017-10-11 ENCOUNTER — Telehealth: Payer: Self-pay | Admitting: Gastroenterology

## 2017-10-11 NOTE — Telephone Encounter (Signed)
Pt would like to reschedule Tues 9/24 Endo procedure. [facility] cancel as well.

## 2017-10-11 NOTE — Telephone Encounter (Signed)
Patient contacted office to cancel her procedure for tomorrow.  No cancellation fee due to the fact that patients procedure was scheduled during her Friday's office visit.  Patient does not want to rescheduled at this time due to other scheduling issues.  Thanks [employer]

## 2017-10-12 ENCOUNTER — Ambulatory Visit: Admit: 2017-10-12 | Payer: BLUE CROSS/BLUE SHIELD | Admitting: Gastroenterology

## 2017-10-12 SURGERY — ESOPHAGOGASTRODUODENOSCOPY (EGD) WITH PROPOFOL
Anesthesia: General

## 2017-11-30 DIAGNOSIS — M25562 Pain in left knee: Secondary | ICD-10-CM | POA: Diagnosis not present

## 2017-12-09 DIAGNOSIS — M222X1 Patellofemoral disorders, right knee: Secondary | ICD-10-CM | POA: Diagnosis not present

## 2017-12-09 DIAGNOSIS — M25561 Pain in right knee: Secondary | ICD-10-CM | POA: Diagnosis not present

## 2017-12-15 DIAGNOSIS — E559 Vitamin D deficiency, unspecified: Secondary | ICD-10-CM | POA: Diagnosis not present

## 2017-12-15 DIAGNOSIS — Z1231 Encounter for screening mammogram for malignant neoplasm of breast: Secondary | ICD-10-CM | POA: Diagnosis not present

## 2017-12-15 DIAGNOSIS — N926 Irregular menstruation, unspecified: Secondary | ICD-10-CM | POA: Diagnosis not present

## 2017-12-15 DIAGNOSIS — Z01419 Encounter for gynecological examination (general) (routine) without abnormal findings: Secondary | ICD-10-CM | POA: Diagnosis not present

## 2017-12-21 DIAGNOSIS — M222X1 Patellofemoral disorders, right knee: Secondary | ICD-10-CM | POA: Diagnosis not present

## 2017-12-21 DIAGNOSIS — M25561 Pain in right knee: Secondary | ICD-10-CM | POA: Diagnosis not present

## 2017-12-28 DIAGNOSIS — M25561 Pain in right knee: Secondary | ICD-10-CM | POA: Diagnosis not present

## 2017-12-28 DIAGNOSIS — M222X1 Patellofemoral disorders, right knee: Secondary | ICD-10-CM | POA: Diagnosis not present

## 2017-12-28 DIAGNOSIS — M170 Bilateral primary osteoarthritis of knee: Secondary | ICD-10-CM | POA: Diagnosis not present

## 2018-01-04 DIAGNOSIS — M25561 Pain in right knee: Secondary | ICD-10-CM | POA: Diagnosis not present

## 2018-01-04 DIAGNOSIS — M222X1 Patellofemoral disorders, right knee: Secondary | ICD-10-CM | POA: Diagnosis not present

## 2018-01-24 DIAGNOSIS — N939 Abnormal uterine and vaginal bleeding, unspecified: Secondary | ICD-10-CM | POA: Diagnosis not present

## 2018-01-24 DIAGNOSIS — D259 Leiomyoma of uterus, unspecified: Secondary | ICD-10-CM | POA: Diagnosis not present

## 2018-01-24 DIAGNOSIS — N920 Excessive and frequent menstruation with regular cycle: Secondary | ICD-10-CM | POA: Diagnosis not present

## 2018-02-09 DIAGNOSIS — N939 Abnormal uterine and vaginal bleeding, unspecified: Secondary | ICD-10-CM | POA: Diagnosis not present

## 2018-02-10 DIAGNOSIS — N858 Other specified noninflammatory disorders of uterus: Secondary | ICD-10-CM | POA: Diagnosis not present

## 2018-02-10 DIAGNOSIS — N926 Irregular menstruation, unspecified: Secondary | ICD-10-CM | POA: Diagnosis not present

## 2018-02-15 DIAGNOSIS — M25561 Pain in right knee: Secondary | ICD-10-CM | POA: Diagnosis not present

## 2018-03-04 DIAGNOSIS — M722 Plantar fascial fibromatosis: Secondary | ICD-10-CM | POA: Diagnosis not present

## 2018-03-04 DIAGNOSIS — M25572 Pain in left ankle and joints of left foot: Secondary | ICD-10-CM | POA: Diagnosis not present

## 2018-03-07 ENCOUNTER — Ambulatory Visit: Payer: Self-pay

## 2018-03-07 NOTE — Telephone Encounter (Signed)
Incoming  Call from  Patient.  Scheduled  An  Appointment for 03/08/18 @  2: 40pm  With  Sharyon Cable NP.  Patient  Voices  Understanding.

## 2018-03-07 NOTE — Telephone Encounter (Signed)
Incoming  Call from  Patient.  Patient  Complains  Of  Being  Dizzy  On  Friday and  Monday.  The  Last  Two  Episodes it  Has  Taken Patient  A little more  Time  To " get herself  Together. Denies  Room  Moving  Or  Tilting.  Patient  States  The  Dizziness has  Occurred  While  She  Was  Sitting  Down and  While  Standing.  Rate  I  Mild  To  Moderate.   Onset  Was  Last  Friday.   Doesn't  Know  What  Is  Causing  The  Dizziness.  Denies  Any  Other  Sx.  LMP  Was  Early  January.   Hear  Rate  78.  Patient  Had  To  Go  To  Class,  Will  Call back  For  An  Appointment  To  Be  Scheduled.  Left  Message  For  A  Return call to  Schedule  Appointment.    Reason for Disposition . [1] MILD dizziness (e.g., walking normally) AND [2] has NOT been evaluated by physician for this  (Exception: dizziness caused by heat exposure, sudden standing, or poor fluid intake)  Answer Assessment - Initial Assessment Questions 1. DESCRIPTION: "Describe your dizziness."     *No Answer* 2. LIGHTHEADED: "Do you feel lightheaded?" (e.g., somewhat faint, woozy, weak upon standing)     *No Answer* 3. VERTIGO: "Do you feel like either you or the room is spinning or tilting?" (i.e. vertigo)    no4. SEVERITY: "How bad is it?"  "Do you feel like you are going to faint?" "Can you stand and walk?"   - MILD - walking normally   - MODERATE - interferes with normal activities (e.g., work, school)    - SEVERE - unable to stand, requires support to walk, feels like passing out now.      Mild  To moderate 5. ONSET:  "When did the dizziness begin?"     This  Past  Friday 6. AGGRAVATING FACTORS: "Does anything make it worse?" (e.g., standing, change in head position)     jus  Standing  And  Just  sitting 7. HEART RATE: "Can you tell me your heart rate?" "How many beats in 15 seconds?"  (Note: not all patients can do this)       78 8. CAUSE: "What do you think is causing the dizziness?"    " I  dont " 9. RECURRENT SYMPTOM:  "Have you had dizziness before?" If so, ask: "When was the last time?" "What happened that time?"     *No Answer* 10. OTHER SYMPTOMS: "Do you have any other symptoms?" (e.g., fever, chest pain, vomiting, diarrhea, bleeding)       denies 11. PREGNANCY: "Is there any chance you are pregnant?" "When was your last menstrual period?"       January  early  Protocols used: DIZZINESS [facility]

## 2018-03-08 ENCOUNTER — Ambulatory Visit: Payer: BLUE CROSS/BLUE SHIELD | Admitting: Nurse Practitioner

## 2018-03-08 ENCOUNTER — Encounter: Payer: Self-pay | Admitting: Nurse Practitioner

## 2018-03-08 VITALS — BP 110/68 | HR 78 | Temp 98.9°F | Resp 16 | Ht 61.0 in | Wt 178.2 lb

## 2018-03-08 DIAGNOSIS — R002 Palpitations: Secondary | ICD-10-CM

## 2018-03-08 DIAGNOSIS — R42 Dizziness and giddiness: Secondary | ICD-10-CM

## 2018-03-08 NOTE — Patient Instructions (Signed)
Please drink at least 64 ounces of water.   Call us back if you do not hear about your referral within the month or your lab within the week Dizziness Dizziness is a common problem. It is a feeling of unsteadiness or light-headedness. You may feel like you are about to faint. Dizziness can lead to injury if you stumble or fall. Anyone can become dizzy, but dizziness is more common in older adults. This condition can be caused by a number of things, including medicines, dehydration, or illness. Follow these instructions at home: Eating and drinking  Drink enough fluid to keep your urine clear or pale yellow. This helps to keep you from becoming dehydrated. Try to drink more clear fluids, such as water.  Do not drink alcohol.  Limit your caffeine intake if told to do so by your health care provider. Check ingredients and nutrition facts to see if a food or beverage contains caffeine.  Limit your salt (sodium) intake if told to do so by your health care provider. Check ingredients and nutrition facts to see if a food or beverage contains sodium. Activity  Avoid making quick movements. ? Rise slowly from chairs and steady yourself until you feel okay. ? In the morning, first sit up on the side of the bed. When you feel okay, stand slowly while you hold onto something until you know that your balance is fine.  If you need to stand in one place for a long time, move your legs often. Tighten and relax the muscles in your legs while you are standing.  Do not drive or use heavy machinery if you feel dizzy.  Avoid bending down if you feel dizzy. Place items in your home so that they are easy for you to reach without leaning over. Lifestyle  Do not use any products that contain nicotine or tobacco, such as cigarettes and e-cigarettes. If you need help quitting, ask your health care provider.  Try to reduce your stress level by using methods such as yoga or meditation. Talk with your health care  provider if you need help to manage your stress. General instructions  Watch your dizziness for any changes.  Take over-the-counter and prescription medicines only as told by your health care provider. Talk with your health care provider if you think that your dizziness is caused by a medicine that you are taking.  Tell a friend or a family member that you are feeling dizzy. If he or she notices any changes in your behavior, have this person call your health care provider.  Keep all follow-up visits as told by your health care provider. This is important. Contact a health care provider if:  Your dizziness does not go away.  Your dizziness or light-headedness gets worse.  You feel nauseous.  You have reduced hearing.  You have new symptoms.  You are unsteady on your feet or you feel like the room is spinning. Get help right away if:  You vomit or have diarrhea and are unable to eat or drink anything.  You have problems talking, walking, swallowing, or using your arms, hands, or legs.  You feel generally weak.  You are not thinking clearly or you have trouble forming sentences. It may take a friend or family member to notice this.  You have chest pain, abdominal pain, shortness of breath, or sweating.  Your vision changes.  You have any bleeding.  You have a severe headache.  You have neck pain or a stiff neck.  You have a fever. These symptoms may represent a serious problem that is an emergency. Do not wait to see if the symptoms will go away. Get medical help right away. Call your local emergency services (911 in the [place] Do not drive yourself to the hospital. Summary  Dizziness is a feeling of unsteadiness or light-headedness. This condition can be caused by a number of things, including medicines, dehydration, or illness.  Anyone can become dizzy, but dizziness is more common in older adults.  Drink enough fluid to keep your urine clear or pale yellow. Do not  drink alcohol.  Avoid making quick movements if you feel dizzy. Monitor your dizziness for any changes. This information is not intended to replace advice given to you by your health care provider. Make sure you discuss any questions you have with your health care provider. Document Released: 07/01/2000 Document Revised: 02/08/2016 Document Reviewed: 02/08/2016 Elsevier Interactive Patient Education  2019 [employer].

## 2018-03-08 NOTE — Progress Notes (Addendum)
Name: [name]   MRN: [id number]    DOB: 05/03/1970   Date:03/08/2018       Progress Note  Subjective  Chief Complaint  Chief Complaint  Patient presents with  . Dizziness    1 episode on Friday night and the other was yesterday around mid-day.     HPI  Patient endorses two episodes of woozy and spinny headed. States has had multiple episodes in the past but has been over the past years and very mild. States had a severe episode on Friday when she was standing and talking and felt off balance. Episodes lasted about 10 seconds and self resolved. She has another episodes yesterday when she was sitting at the desk and felt head heaviness and little woozy again. Did not feel like she was spinning or the room was spinning, denies blurry vision, chest pain, nausea, sinus congestions, ear fullness. States has had intermittent fluttering- typically lasts few seconds had an episode last weekend but cannot recall previous episode. Does not drink caffeine. Drinks about 25-30 ounces of water a day. No sleep issues. Has been to obgyn recently due to excessive menstrual bleeding.   Had heart monitor over 20 years ago that didn't show anything that she can recall.  Had recent blood work with [facility]: 01/24/2018 TSH 1.478 Hgb/hct 14.8/42.8K 4.5, cre 0.67   Patient Active Problem List   Diagnosis Date Noted  . Menopausal symptom 06/17/2017  . Irregular periods 06/17/2017  . Dysmenorrhea 06/17/2017  . Drug allergy 06/17/2017  . History of cesarean section 06/17/2017  . Vitamin D deficiency 12/04/2016  . Plantar fasciitis, left 05/19/2016  . Cervical high risk HPV (human papillomavirus) test positive 11/04/2015  . Allergic rhinitis 10/10/2014  . Back pain, chronic 10/10/2014  . Clinical depression 10/10/2014  . Dermatitis, eczematoid 10/10/2014  . Elevated prolactin level (HCC) 10/10/2014  . Insomnia 10/10/2014  . Umbilical hernia without obstruction and without gangrene 10/10/2014  .  H/O allergy to multiple drugs 02/22/2012  . Blood type, Rh negative 02/22/2012  . Previous cesarean delivery x 4 02/22/2012  . Personal history of kidney stones 02/22/2012    Past Medical History:  Diagnosis Date  . Allergy   . Arthritis   . Asthma    exercise induced  . Back pain    bulging disc  . Back pain, chronic   . Cellulitis   . Eczema   . Endometritis   . Fatigue   . Headache   . Hernia    Umbilical  . History of chicken pox   . Insomnia   . Irregular menses   . Kidney stones   . Ovarian cyst   . Over weight   . Rectus diastasis   . Right hip pain   . Yeast infection     Past Surgical History:  Procedure Laterality Date  . CESAREAN SECTION  2003,2005,2008,2010  . FOOT SURGERY     neuroma left foot  . tummy tuck  05/31/2014  . UMBILICAL HERNIA REPAIR  05/31/2014  . VENTRAL HERNIA REPAIR  05/31/2014  . WISDOM TOOTH EXTRACTION      Social History   Tobacco Use  . Smoking status: Never Smoker  . Smokeless tobacco: Never Used  Substance Use Topics  . Alcohol use: No     Current Outpatient Medications:  .  clobetasol ointment (TEMOVATE) 0.05 %, APPLY 1 APPLICATION TO SKIN TWICE DAILY, Disp: , Rfl: 4  Allergies  Allergen Reactions  . Erythromycin Other (See Comments)  Severe abdominal pain  . Penicillins Hives    ROS   No other specific complaints in a complete review of systems (except as listed in HPI above).  Objective  Vitals:   03/08/18 1518 03/08/18 1554 03/08/18 1555 03/08/18 1556  BP: 112/74 110/70 110/60 110/68  Pulse: 78     Resp: 16     Temp: 98.9 F (37.2 C)     TempSrc: Oral     SpO2: 96%     Weight: 178 lb 3.2 oz (80.8 kg)     Height: 5\' 1"  (1.549 m)       Body mass index is 33.67 kg/m.  Nursing Note and Vital Signs reviewed.  Physical Exam Vitals signs reviewed.  Constitutional:      Appearance: She is well-developed.  HENT:     Head: Normocephalic and atraumatic.  Neck:     Musculoskeletal: Normal  range of motion and neck supple.     Vascular: No carotid bruit.  Cardiovascular:     Pulses: Normal pulses.     Heart sounds: Normal heart sounds.  Pulmonary:     Effort: Pulmonary effort is normal.     Breath sounds: Normal breath sounds.  Abdominal:     General: Bowel sounds are normal.     Palpations: Abdomen is soft.     Tenderness: There is no abdominal tenderness.  Musculoskeletal: Normal range of motion.  Skin:    General: Skin is warm and dry.     Capillary Refill: Capillary refill takes less than 2 seconds.  Neurological:     Mental Status: She is alert and oriented to person, place, and time.     GCS: GCS eye subscore is 4. GCS verbal subscore is 5. GCS motor subscore is 6.     Sensory: No sensory deficit.  Psychiatric:        Speech: Speech normal.        Behavior: Behavior normal.        Thought Content: Thought content normal.        Judgment: Judgment normal.       No results found for this or any previous visit (from the past 48 hour(s)).  Assessment & Plan  1. Dizziness - EKG 12-Lead: normal sinus rhythm without ectopy.  - Orthostatic vital signs - CBC w/Diff/Platelet - COMPLETE METABOLIC PANEL WITH GFR - TSH - Ambulatory referral to Cardiology  2. Palpitation - CBC w/Diff/Platelet - COMPLETE METABOLIC PANEL WITH GFR - TSH - Ambulatory referral to Cardiology  Patient presents with 2 episodes of dizziness in the last 3 days and an episode of heart fluttering, no other associated symptoms.  Normal EKG, not orthostatic. Patient states she has been having these symptoms ever since she was in college however they are very infrequent.  Has been evaluated by cardiology when she was in grad school with negative work-up.  Patient states these past 2 episodes were the worst they have been and were closer together than most episodes.  She did recently have blood work with OB/GYN with no signs of anemia, electrolyte imbalances and normal TSH.  Will recheck today  as symptoms have been this past weekend.  Refer to cardiology for further work-up.  Discussed the importance of hydration.  Discussed ER precautions.  -Red flags and when to present for emergency care or RTC including fever >101.66F, chest pain, shortness of breath, new/worsening/un-resolving symptoms, reviewed with patient at time of visit. Follow up and care instructions discussed and provided in AVS.

## 2018-03-09 LAB — CBC WITH DIFFERENTIAL/PLATELET
ABSOLUTE MONOCYTES: 639 {cells}/uL (ref 200–950)
Basophils Absolute: 56 cells/uL (ref 0–200)
Basophils Relative: 0.6 %
Eosinophils Absolute: 169 cells/uL (ref 15–500)
Eosinophils Relative: 1.8 %
HCT: 41.7 % (ref 35.0–45.0)
Hemoglobin: 14.5 g/dL (ref 11.7–15.5)
Lymphs Abs: 1730 cells/uL (ref 850–3900)
MCH: 30.5 pg (ref 27.0–33.0)
MCHC: 34.8 g/dL (ref 32.0–36.0)
MCV: 87.6 fL (ref 80.0–100.0)
MPV: 10.3 fL (ref 7.5–12.5)
Monocytes Relative: 6.8 %
NEUTROS PCT: 72.4 %
Neutro Abs: 6806 cells/uL (ref 1500–7800)
PLATELETS: 271 10*3/uL (ref 140–400)
RBC: 4.76 10*6/uL (ref 3.80–5.10)
RDW: 12.4 % (ref 11.0–15.0)
TOTAL LYMPHOCYTE: 18.4 %
WBC: 9.4 10*3/uL (ref 3.8–10.8)

## 2018-03-09 LAB — TSH: TSH: 0.96 mIU/L

## 2018-03-09 LAB — COMPLETE METABOLIC PANEL WITH GFR
AG RATIO: 1.7 (calc) (ref 1.0–2.5)
ALKALINE PHOSPHATASE (APISO): 79 U/L (ref 31–125)
ALT: 14 U/L (ref 6–29)
AST: 17 U/L (ref 10–35)
Albumin: 4.5 g/dL (ref 3.6–5.1)
BUN: 18 mg/dL (ref 7–25)
CALCIUM: 9.7 mg/dL (ref 8.6–10.2)
CO2: 26 mmol/L (ref 20–32)
Chloride: 103 mmol/L (ref 98–110)
Creat: 0.77 mg/dL (ref 0.50–1.10)
GFR, EST NON AFRICAN AMERICAN: 92 mL/min/{1.73_m2} (ref >=60)
GFR, Est African American: 107 mL/min/{1.73_m2} (ref >=60)
Globulin: 2.7 g/dL (calc) (ref 1.9–3.7)
Glucose, Bld: 87 mg/dL (ref 65–99)
POTASSIUM: 4 mmol/L (ref 3.5–5.3)
Sodium: 138 mmol/L (ref 135–146)
Total Bilirubin: 0.4 mg/dL (ref 0.2–1.2)
Total Protein: 7.2 g/dL (ref 6.1–8.1)

## 2018-03-11 ENCOUNTER — Ambulatory Visit: Payer: BLUE CROSS/BLUE SHIELD | Admitting: Nurse Practitioner

## 2018-03-11 ENCOUNTER — Encounter: Payer: Self-pay | Admitting: Nurse Practitioner

## 2018-03-11 VITALS — BP 118/78 | HR 95 | Temp 98.9°F | Resp 16 | Ht 62.0 in | Wt 178.0 lb

## 2018-03-11 DIAGNOSIS — M1712 Unilateral primary osteoarthritis, left knee: Secondary | ICD-10-CM | POA: Diagnosis not present

## 2018-03-11 DIAGNOSIS — M25561 Pain in right knee: Secondary | ICD-10-CM | POA: Diagnosis not present

## 2018-03-11 DIAGNOSIS — M1711 Unilateral primary osteoarthritis, right knee: Secondary | ICD-10-CM | POA: Diagnosis not present

## 2018-03-11 DIAGNOSIS — J069 Acute upper respiratory infection, unspecified: Secondary | ICD-10-CM

## 2018-03-11 DIAGNOSIS — R6889 Other general symptoms and signs: Secondary | ICD-10-CM

## 2018-03-11 DIAGNOSIS — M25562 Pain in left knee: Secondary | ICD-10-CM | POA: Diagnosis not present

## 2018-03-11 LAB — POCT INFLUENZA A/B
Influenza A, POC: NEGATIVE
Influenza B, POC: NEGATIVE

## 2018-03-11 MED ORDER — BENZONATATE 100 MG PO CAPS: ORAL_CAPSULE | ORAL | 0 refills | Status: DC

## 2018-03-11 NOTE — Patient Instructions (Signed)
Nice meeting you today [name]! Your flu test was negative, yay! Please increase fluid intake, rest and continue proper hand washing! Hot tea with honey and lemon will help your cough and you can consider cough drops Please consider starting Dayquil/Nyquil for multisymptom relief I will send something for your cough and remember it may cause drowsiness Return to clinic if symptoms persist or worsen

## 2018-03-11 NOTE — Progress Notes (Signed)
   Subjective:    Patient ID: [name], female    DOB: 1970/06/09, 48 y.o.   MRN: [id number]  HPI [name] comes [facility] today with complaints of sore throat throat, body aches nonproductive cough nasal congestion, headache x1 day.  She does admit everyone in her home has been sick similar symptoms.  She denies fever, SOB, wheezing, chest pain or facial pain.  Denies any self treatment.   Review of Systems  Constitutional: Negative for fever.  HENT: Positive for congestion and sore throat. Negative for ear pain, sinus pressure and sinus pain.   Respiratory: Positive for cough. Negative for shortness of breath.   Cardiovascular: Negative for chest pain.  Musculoskeletal: Positive for myalgias.  Skin: Negative for rash.  Neurological: Positive for headaches.       Objective:   Physical Exam Vitals signs reviewed.  Constitutional:      Appearance: She is well-developed.  HENT:     Head: Normocephalic and atraumatic.     Comments: No maxillary or frontal sinus tenderness    Right Ear: Ear canal normal.     Left Ear: Ear canal normal.     Ears:     Comments: Bilateral serous fluid to nonerythematous intact TM    Mouth/Throat:     Mouth: Mucous membranes are moist.     Comments: Mildly injected pharynx Neck:     Musculoskeletal: Normal range of motion and neck supple.  Cardiovascular:     Rate and Rhythm: Normal rate and regular rhythm.     Heart sounds: Normal heart sounds.  Pulmonary:     Effort: Pulmonary effort is normal. No respiratory distress.     Breath sounds: Normal breath sounds. No wheezing, rhonchi or rales.     Comments: Dry cough induced with deep inspiration Abdominal:     General: Bowel sounds are normal.     Palpations: Abdomen is soft.  Musculoskeletal: Normal range of motion.  Skin:    General: Skin is warm and dry.  Neurological:     Mental Status: She is alert and oriented to person, place, and time.            Assessment & Plan:

## 2018-03-14 DIAGNOSIS — M25572 Pain in left ankle and joints of left foot: Secondary | ICD-10-CM | POA: Diagnosis not present

## 2018-03-15 ENCOUNTER — Encounter: Payer: Self-pay | Admitting: Family Medicine

## 2018-03-17 DIAGNOSIS — M722 Plantar fascial fibromatosis: Secondary | ICD-10-CM | POA: Diagnosis not present

## 2018-03-17 DIAGNOSIS — M25572 Pain in left ankle and joints of left foot: Secondary | ICD-10-CM | POA: Diagnosis not present

## 2018-03-28 DIAGNOSIS — M179 Osteoarthritis of knee, unspecified: Secondary | ICD-10-CM | POA: Insufficient documentation

## 2018-03-28 DIAGNOSIS — M1712 Unilateral primary osteoarthritis, left knee: Secondary | ICD-10-CM | POA: Diagnosis not present

## 2018-03-28 DIAGNOSIS — M1711 Unilateral primary osteoarthritis, right knee: Secondary | ICD-10-CM | POA: Diagnosis not present

## 2018-03-28 DIAGNOSIS — M1710 Unilateral primary osteoarthritis, unspecified knee: Secondary | ICD-10-CM | POA: Insufficient documentation

## 2018-05-13 ENCOUNTER — Encounter

## 2018-05-13 ENCOUNTER — Ambulatory Visit: Payer: BLUE CROSS/BLUE SHIELD | Admitting: Internal Medicine

## 2018-06-20 ENCOUNTER — Ambulatory Visit: Payer: Self-pay | Admitting: *Deleted

## 2018-06-20 NOTE — Telephone Encounter (Signed)
Message from Geronimo Boot sent at 06/20/2018 4:28 PM EDT   Summary: Clinical Advice   Patient stated that her eye has been swollen for the last 3 days due to eczema. Patient would like a callback with OTC recommendations. Please advise.         Pt called but no answer. Unable to leave message.

## 2018-06-23 ENCOUNTER — Other Ambulatory Visit: Payer: Self-pay

## 2018-06-23 ENCOUNTER — Ambulatory Visit: Payer: BLUE CROSS/BLUE SHIELD | Admitting: Family Medicine

## 2018-06-23 ENCOUNTER — Encounter: Payer: Self-pay | Admitting: Family Medicine

## 2018-06-23 VITALS — BP 108/64 | HR 75 | Temp 97.9°F | Resp 16 | Ht 61.0 in | Wt 176.2 lb

## 2018-06-23 DIAGNOSIS — H1012 Acute atopic conjunctivitis, left eye: Secondary | ICD-10-CM

## 2018-06-23 DIAGNOSIS — E229 Hyperfunction of pituitary gland, unspecified: Secondary | ICD-10-CM

## 2018-06-23 DIAGNOSIS — L309 Dermatitis, unspecified: Secondary | ICD-10-CM | POA: Diagnosis not present

## 2018-06-23 DIAGNOSIS — R7989 Other specified abnormal findings of blood chemistry: Secondary | ICD-10-CM

## 2018-06-23 MED ORDER — CRISABOROLE 2 % EX OINT: 1.0000 g | TOPICAL_OINTMENT | Freq: Every day | CUTANEOUS | 0 refills | Status: DC

## 2018-06-23 MED ORDER — OLOPATADINE HCL 0.1 % OP SOLN: 1.0000 [drp] | Freq: Two times a day (BID) | OPHTHALMIC | 1 refills | Status: DC

## 2018-06-23 NOTE — Progress Notes (Signed)
Name: [name]   MRN: [id number]    DOB: 07/08/1970   Date:06/23/2018       Progress Note  Subjective  Chief Complaint  Chief Complaint  Patient presents with  . Belepharitis    Onset-4 days, Left eye lid has been swollen, irritated, red. Patient has tried otc Pink eye treatment    HPI  Left eye irritation: she has a long history of eczema and left eye has been very itchy, crust on eye lid , She denies blurred vision, she has tried otc medication and today it feels better, however still concerned, she uses steroids for her chronic eczema on her hands but she knows not to use it on her face.   Patient Active Problem List   Diagnosis Date Noted  . Osteoarthritis of left knee 03/28/2018  . Osteoarthritis of knee 03/28/2018  . Menopausal symptom 06/17/2017  . Irregular periods 06/17/2017  . Dysmenorrhea 06/17/2017  . Drug allergy 06/17/2017  . History of cesarean section 06/17/2017  . Vitamin D deficiency 12/04/2016  . Plantar fasciitis, left 05/19/2016  . Cervical high risk HPV (human papillomavirus) test positive 11/04/2015  . Allergic rhinitis 10/10/2014  . Back pain, chronic 10/10/2014  . Clinical depression 10/10/2014  . Dermatitis, eczematoid 10/10/2014  . Elevated prolactin level (HCC) 10/10/2014  . Insomnia 10/10/2014  . Umbilical hernia without obstruction and without gangrene 10/10/2014  . H/O allergy to multiple drugs 02/22/2012  . Blood type, Rh negative 02/22/2012  . Previous cesarean delivery x 4 02/22/2012  . Personal history of kidney stones 02/22/2012    Past Surgical History:  Procedure Laterality Date  . CESAREAN SECTION  2003,2005,2008,2010  . FOOT SURGERY     neuroma left foot  . tummy tuck  05/31/2014  . UMBILICAL HERNIA REPAIR  05/31/2014  . VENTRAL HERNIA REPAIR  05/31/2014  . WISDOM TOOTH EXTRACTION      Family History  Problem Relation Age of Onset  . Cancer Mother 370       breast cancer  . Osteoarthritis Mother   . Broken bones  Mother        Hip x2  . Hyperlipidemia Father   . Hypertension Father   . Heart attack Father   . Hypertension Paternal Grandmother   . Congenital heart disease Paternal Grandmother   . Stroke Paternal Grandfather   . Kidney disease Daughter   . Hypercholesterolemia Brother   . Colon cancer Maternal Grandmother   . Heart disease Maternal Grandfather   . Hypertension Sister   . Migraines Sister   . Thyroid disease Sister   . GI problems Sister        Extra kink in her large intestine  . Hypercholesterolemia Brother     Social History   Socioeconomic History  . Marital status: Married    Spouse name: Not on file  . Number of children: 4  . Years of education: Not on file  . Highest education level: Doctorate  Occupational History  . Occupation: Professor    Associate Professormployer: [employer]    Comment: [place]  Social Needs  . Financial resource strain: Not hard at all  . Food insecurity:    Worry: Never true    Inability: Never true  . Transportation needs:    Medical: No    Non-medical: No  Tobacco Use  . Smoking status: Never Smoker  . Smokeless tobacco: Never Used  Substance and Sexual Activity  . Alcohol use: No  . Drug use: No  .  Sexual activity: Yes    Partners: Male    Birth control/protection: Rhythm    Comment: married   Lifestyle  . Physical activity:    Days per week: 3 days    Minutes per session: 150+ min  . Stress: Not at all  Relationships  . Social connections:    Talks on phone: More than three times a week    Gets together: More than three times a week    Attends religious service: More than 4 times per year    Active member of club or organization: Yes    Attends meetings of clubs or organizations: More than 4 times per year    Relationship status: Married  . Intimate partner violence:    Fear of current or ex partner: No    Emotionally abused: No    Physically abused: No    Forced sexual activity: No  Other Topics Concern  . Not on file  Social  History Narrative   Married, she has 4 daughters   She works as Network engineer at [employer], and is on leadership role and teaching     Current Outpatient Medications:  .  clobetasol ointment (TEMOVATE) 0.05 %, APPLY 1 APPLICATION TO SKIN TWICE DAILY, Disp: , Rfl: 4 .  benzonatate (TESSALON PERLES) 100 MG capsule, 1-2 capsules TID as needed for cough (Patient not taking: Reported on 06/23/2018), Disp: 30 capsule, Rfl: 0  Allergies  Allergen Reactions  . Erythromycin Other (See Comments)    Severe abdominal pain  . Penicillins Hives    I personally reviewed active problem list, medication list, allergies, family history, social history with the patient/caregiver today.   ROS  Ten systems reviewed and is negative except as mentioned in HPI   Objective  Vitals:   06/23/18 0928  BP: 108/64  Pulse: 75  Resp: 16  Temp: 97.9 F (36.6 C)  TempSrc: Oral  SpO2: 97%  Weight: 176 lb 3.2 oz (79.9 kg)  Height:  (1.549 m)    Body mass index is 33.29 kg/m.  Physical Exam  Constitutional: Patient appears well-developed and well-nourished. Obese No distress.  HEENT: head atraumatic, normocephalic, pupils equal and reactive to light,  neck supple, conjunctiva intact, crust eyelid on left side, mild erythema Skin; eczema on both hands with some excoriation  Cardiovascular: Normal rate, regular rhythm and normal heart sounds.  No murmur heard. No BLE edema. Pulmonary/Chest: Effort normal and breath sounds normal. No respiratory distress. Abdominal: Soft.  There is no tenderness. Psychiatric: Patient has a normal mood and affect. behavior is normal. Judgment and thought content normal.  PHQ2/9: Depression screen [facility] 2/9 06/23/2018 03/08/2018 08/25/2017 10/10/2014  Decreased Interest 0 0 0 0  Down, Depressed, Hopeless 0 0 0 0  PHQ - 2 Score 0 0 0 0  Altered sleeping 0 - - -  Tired, decreased energy 0 - - -  Change in appetite 0 - - -  Feeling bad or failure about yourself  0 - - -   Trouble concentrating 0 - - -  Moving slowly or fidgety/restless 0 - - -  Suicidal thoughts 0 - - -  PHQ-9 Score 0 - - -  Difficult doing work/chores Not difficult at all - - -    phq 9 is negative   Fall Risk: Fall Risk  06/23/2018 03/08/2018 08/25/2017 10/10/2014  Falls in the past year? 0 0 No No  Number falls in past yr: 0 0 - -  Injury with Fall?  0 0 - -    Functional Status Survey: Is the patient deaf or have difficulty hearing?: No Does the patient have difficulty seeing, even when wearing glasses/contacts?: No Does the patient have difficulty concentrating, remembering, or making decisions?: No Does the patient have difficulty walking or climbing stairs?: No Does the patient have difficulty dressing or bathing?: No Does the patient have difficulty doing errands alone such as visiting a doctor's office or shopping?: No    Assessment & Plan  1. Elevated prolactin level (HCC)  Recheck yearly   2. Facial eczema  - Crisaborole (EUCRISA) 2 % OINT; Apply 1 g topically daily.  Dispense: 100 g; Refill: 0  3. Allergic conjunctivitis of left eye  - olopatadine (PATANOL) 0.1 % ophthalmic solution; Place 1 drop into both eyes 2 (two) times daily.  Dispense: 5 mL; Refill: 1

## 2018-06-29 ENCOUNTER — Encounter: Payer: Self-pay | Admitting: Family Medicine

## 2018-07-01 DIAGNOSIS — H01113 Allergic dermatitis of right eye, unspecified eyelid: Secondary | ICD-10-CM | POA: Diagnosis not present

## 2018-08-29 ENCOUNTER — Ambulatory Visit (INDEPENDENT_AMBULATORY_CARE_PROVIDER_SITE_OTHER): Payer: BC Managed Care – PPO | Admitting: Family Medicine

## 2018-08-29 ENCOUNTER — Other Ambulatory Visit: Payer: Self-pay

## 2018-08-29 ENCOUNTER — Encounter: Payer: Self-pay | Admitting: Family Medicine

## 2018-08-29 VITALS — BP 110/70 | HR 85 | Temp 97.3°F | Resp 16 | Ht 62.0 in | Wt 177.0 lb

## 2018-08-29 DIAGNOSIS — Z131 Encounter for screening for diabetes mellitus: Secondary | ICD-10-CM

## 2018-08-29 DIAGNOSIS — R635 Abnormal weight gain: Secondary | ICD-10-CM

## 2018-08-29 DIAGNOSIS — Z01419 Encounter for gynecological examination (general) (routine) without abnormal findings: Secondary | ICD-10-CM | POA: Diagnosis not present

## 2018-08-29 DIAGNOSIS — Z124 Encounter for screening for malignant neoplasm of cervix: Secondary | ICD-10-CM | POA: Diagnosis not present

## 2018-08-29 DIAGNOSIS — Z13220 Encounter for screening for lipoid disorders: Secondary | ICD-10-CM

## 2018-08-29 DIAGNOSIS — E229 Hyperfunction of pituitary gland, unspecified: Secondary | ICD-10-CM

## 2018-08-29 DIAGNOSIS — Z1239 Encounter for other screening for malignant neoplasm of breast: Secondary | ICD-10-CM

## 2018-08-29 DIAGNOSIS — E669 Obesity, unspecified: Secondary | ICD-10-CM

## 2018-08-29 DIAGNOSIS — E559 Vitamin D deficiency, unspecified: Secondary | ICD-10-CM

## 2018-08-29 DIAGNOSIS — R7989 Other specified abnormal findings of blood chemistry: Secondary | ICD-10-CM

## 2018-08-29 DIAGNOSIS — Z1159 Encounter for screening for other viral diseases: Secondary | ICD-10-CM

## 2018-08-29 NOTE — Patient Instructions (Addendum)
Keto diet for 2 weeks followed by carbohydrate restrictive diet 30-50 g daily   Preventive Care 48-48 Years Old, Female Preventive care refers to visits with your health care provider and lifestyle choices that can promote health and wellness. This includes:  A yearly physical exam. This may also be called an annual well check.  Regular dental visits and eye exams.  Immunizations.  Screening for certain conditions.  Healthy lifestyle choices, such as eating a healthy diet, getting regular exercise, not using drugs or products that contain nicotine and tobacco, and limiting alcohol use. What can I expect for my preventive care visit? Physical exam Your health care provider will check your:  Height and weight. This may be used to calculate body mass index (BMI), which tells if you are at a healthy weight.  Heart rate and blood pressure.  Skin for abnormal spots. Counseling Your health care provider may ask you questions about your:  Alcohol, tobacco, and drug use.  Emotional well-being.  Home and relationship well-being.  Sexual activity.  Eating habits.  Work and work Statistician.  Method of birth control.  Menstrual cycle.  Pregnancy history. What immunizations do I need?  Influenza (flu) vaccine  This is recommended every year. Tetanus, diphtheria, and pertussis (Tdap) vaccine  You may need a Td booster every 10 years. Varicella (chickenpox) vaccine  You may need this if you have not been vaccinated. Zoster (shingles) vaccine  You may need this after age 32. Measles, mumps, and rubella (MMR) vaccine  You may need at least one dose of MMR if you were born in 1957 or later. You may also need a second dose. Pneumococcal conjugate (PCV13) vaccine  You may need this if you have certain conditions and were not previously vaccinated. Pneumococcal polysaccharide (PPSV23) vaccine  You may need one or two doses if you smoke cigarettes or if you have certain  conditions. Meningococcal conjugate (MenACWY) vaccine  You may need this if you have certain conditions. Hepatitis A vaccine  You may need this if you have certain conditions or if you travel or work in places where you may be exposed to hepatitis A. Hepatitis B vaccine  You may need this if you have certain conditions or if you travel or work in places where you may be exposed to hepatitis B. Haemophilus influenzae type b (Hib) vaccine  You may need this if you have certain conditions. Human papillomavirus (HPV) vaccine  If recommended by your health care provider, you may need three doses over 6 months. You may receive vaccines as individual doses or as more than one vaccine together in one shot (combination vaccines). Talk with your health care provider about the risks and benefits of combination vaccines. What tests do I need? Blood tests  Lipid and cholesterol levels. These may be checked every 5 years, or more frequently if you are over 30 years old.  Hepatitis C test.  Hepatitis B test. Screening  Lung cancer screening. You may have this screening every year starting at age 58 if you have a 30-pack-year history of smoking and currently smoke or have quit within the past 15 years.  Colorectal cancer screening. All adults should have this screening starting at age 33 and continuing until age 82. Your health care provider may recommend screening at age 4 if you are at increased risk. You will have tests every 1-10 years, depending on your results and the type of screening test.  Diabetes screening. This is done by checking your blood  sugar (glucose) after you have not eaten for a while (fasting). You may have this done every 1-3 years.  Mammogram. This may be done every 1-2 years. Talk with your health care provider about when you should start having regular mammograms. This may depend on whether you have a family history of breast cancer.  BRCA-related cancer screening. This  may be done if you have a family history of breast, ovarian, tubal, or peritoneal cancers.  Pelvic exam and Pap test. This may be done every 3 years starting at age 3. Starting at age 58, this may be done every 5 years if you have a Pap test in combination with an HPV test. Other tests  Sexually transmitted disease (STD) testing.  Bone density scan. This is done to screen for osteoporosis. You may have this scan if you are at high risk for osteoporosis. Follow these instructions at home: Eating and drinking  Eat a diet that includes fresh fruits and vegetables, whole grains, lean protein, and low-fat dairy.  Take vitamin and mineral supplements as recommended by your health care provider.  Do not drink alcohol if: ? Your health care provider tells you not to drink. ? You are pregnant, may be pregnant, or are planning to become pregnant.  If you drink alcohol: ? Limit how much you have to 0-1 drink a day. ? Be aware of how much alcohol is in your drink. In the [place] one drink equals one 12 oz bottle of beer (355 mL), one 5 oz glass of wine (148 mL), or one 1 oz glass of hard liquor (44 mL). Lifestyle  Take daily care of your teeth and gums.  Stay active. Exercise for at least 30 minutes on 5 or more days each week.  Do not use any products that contain nicotine or tobacco, such as cigarettes, e-cigarettes, and chewing tobacco. If you need help quitting, ask your health care provider.  If you are sexually active, practice safe sex. Use a condom or other form of birth control (contraception) in order to prevent pregnancy and STIs (sexually transmitted infections).  If told by your health care provider, take low-dose aspirin daily starting at age 30. What's next?  Visit your health care provider once a year for a well check visit.  Ask your health care provider how often you should have your eyes and teeth checked.  Stay up to date on all vaccines. This information is not  intended to replace advice given to you by your health care provider. Make sure you discuss any questions you have with your health care provider. Document Released: 02/01/2015 Document Revised: 09/16/2017 Document Reviewed: 09/16/2017 Elsevier Patient Education  2020 [employer].

## 2018-08-29 NOTE — Progress Notes (Signed)
Name: [name]   MRN: [id number]    DOB: 1970-12-01   Date:08/29/2018       Progress Note  Subjective  Chief Complaint  Chief Complaint  Patient presents with  . Annual Exam    HPI   Patient presents for annual CPE   Obesity: she is upset about the weight gain, she states she eats healthy, exercises and is drinking water but gained 6 lbs since last visit. Discussed carbohydrate restrictive diet and also check labs and consider metformin or GLP-1 agonists, she states if she has insulin resistance she is willing to start medication. She denies family history of thyroid cancer or personal history of pancreatitis   Mild change in bowel movements: she states she has gained 20 lbs since she had hernia repair and tummy tuck, states feeling bloated, had evaluation by gyn and negative pelvic US, occasionally has diarrhea, but no fever, mucus or blood in stools, discussed referral to GI but she would like to hold off for now  Diet: tries to eat a healthy diet , she has a phD in nutrition. Cutting down on desserts  Exercise: she has been more active since pandemic, she still has some knee and foot problems but is seeing ortho and getting PT ( she has plantar fascitis left ), she has been walking without problems now , unable to play tennis   USPSTF grade A and B recommendations    Office Visit from 08/29/2018 in [facility]  AUDIT-C Score  0     Hypertension: BP Readings from Last 3 Encounters:  08/29/18 110/70  06/23/18 108/64  03/11/18 118/78   Obesity: Wt Readings from Last 3 Encounters:  08/29/18 177 lb (80.3 kg)  06/23/18 176 lb 3.2 oz (79.9 kg)  03/11/18 178 lb (80.7 kg)   BMI Readings from Last 3 Encounters:  08/29/18 32.37 kg/m  06/23/18 33.29 kg/m  03/11/18 32.56 kg/m    Hep C Screening: today  STD testing and prevention (HIV/chl/gon/syphilis): N/A Intimate partner violence: negative screen  Sexual History/Pain during Intercourse: no pain   Menstrual History/LMP/Abnormal Bleeding: irregular cycles, peri-menopause, discussed risk of pregnancy even at this stage , seen by gyn and negative studies for secondary dysmenorrhea  Incontinence Symptoms: no symptoms   Advanced Care Planning: A voluntary discussion about advance care planning including the explanation and discussion of advance directives.  Discussed health care proxy and Living will, and the patient was able to identify a health care proxy as husband .  Patient does not know have a living will at present time.  Breast cancer: gave patient card to schedule it  BRCA gene screening: N/A Cervical cancer screening: repeat in 20  Osteoporosis Screening: discussed high calcium and vitamin D diet   Lipids:  Lab Results  Component Value Date   CHOL 176 08/25/2017   CHOL 183 05/19/2016   CHOL 186 10/10/2014   Lab Results  Component Value Date   HDL 56 08/25/2017   HDL 72 05/19/2016   HDL 63 10/10/2014   Lab Results  Component Value Date   LDLCALC 104 (H) 08/25/2017   LDLCALC 97 05/19/2016   LDLCALC 95 10/10/2014   Lab Results  Component Value Date   TRIG 69 08/25/2017   TRIG 72 05/19/2016   TRIG 142 10/10/2014   Lab Results  Component Value Date   CHOLHDL 3.1 08/25/2017   CHOLHDL 2.5 05/19/2016   CHOLHDL 3.0 10/10/2014   No results found for: LDLDIRECT  Glucose:  Glucose,  Bld  Date Value Ref Range Status  03/08/2018 87 65 - 99 mg/dL Final    Comment:    .            Fasting reference interval .   08/25/2017 88 65 - 99 mg/dL Final    Comment:    .            Fasting reference interval .   05/19/2016 78 65 - 99 mg/dL Final    Skin cancer: discussed atypical lesions Colorectal cancer: discussed USPTF  Lung cancer:  Low Dose CT Chest recommended if Age 69-80 years, 30 pack-year currently smoking OR have quit w/in 15years. Patient does not qualify.   ECG:02/2018  Patient Active Problem List   Diagnosis Date Noted  . Osteoarthritis of left  knee 03/28/2018  . Osteoarthritis of knee 03/28/2018  . Menopausal symptom 06/17/2017  . Irregular periods 06/17/2017  . Dysmenorrhea 06/17/2017  . Drug allergy 06/17/2017  . History of cesarean section 06/17/2017  . Vitamin D deficiency 12/04/2016  . Plantar fasciitis, left 05/19/2016  . Cervical high risk HPV (human papillomavirus) test positive 11/04/2015  . Allergic rhinitis 10/10/2014  . Back pain, chronic 10/10/2014  . Clinical depression 10/10/2014  . Dermatitis, eczematoid 10/10/2014  . Elevated prolactin level ([place]) 10/10/2014  . Insomnia 10/10/2014  . Umbilical hernia without obstruction and without gangrene 10/10/2014  . H/O allergy to multiple drugs 02/22/2012  . Blood type, Rh negative 02/22/2012  . Previous cesarean delivery x 4 02/22/2012  . Personal history of kidney stones 02/22/2012    Past Surgical History:  Procedure Laterality Date  . CESAREAN SECTION  2003,2005,2008,2010  . FOOT SURGERY     neuroma left foot  . tummy tuck  05/31/2014  . UMBILICAL HERNIA REPAIR  05/31/2014  . VENTRAL HERNIA REPAIR  05/31/2014  . WISDOM TOOTH EXTRACTION      Family History  Problem Relation Age of Onset  . Cancer Mother 33       breast cancer  . Osteoarthritis Mother   . Broken bones Mother        Hip x2  . Varicose Veins Mother   . Hyperlipidemia Father   . Hypertension Father   . Heart attack Father   . Hypertension Paternal Grandmother   . Congenital heart disease Paternal Grandmother   . Stroke Paternal Grandfather   . Kidney disease Daughter   . Hypercholesterolemia Brother   . Colon cancer Maternal Grandmother   . Heart disease Maternal Grandfather   . Hypertension Sister   . Migraines Sister   . Thyroid disease Sister   . GI problems Sister        Extra kink in her large intestine  . Hypercholesterolemia Brother     Social History   Socioeconomic History  . Marital status: Married    Spouse name: Not on file  . Number of children: 4  . Years  of education: Not on file  . Highest education level: Doctorate  Occupational History  . Occupation: Professor    Fish farm manager: [employer]    Comment: [employer]  . Financial resource strain: Not hard at all  . Food insecurity    Worry: Never true    Inability: Never true  . Transportation needs    Medical: No    Non-medical: No  Tobacco Use  . Smoking status: Never Smoker  . Smokeless tobacco: Never Used  Substance and Sexual Activity  . Alcohol use: No  . Drug use:  No  . Sexual activity: Yes    Partners: Male    Birth control/protection: Rhythm    Comment: married   Lifestyle  . Physical activity    Days per week: 3 days    Minutes per session: 150+ min  . Stress: Not at all  Relationships  . Social connections    Talks on phone: More than three times a week    Gets together: More than three times a week    Attends religious service: More than 4 times per year    Active member of club or organization: Yes    Attends meetings of clubs or organizations: More than 4 times per year    Relationship status: Married  . Intimate partner violence    Fear of current or ex partner: No    Emotionally abused: No    Physically abused: No    Forced sexual activity: No  Other Topics Concern  . Not on file  Social History Narrative   Married, she has 4 daughters   She works as Development worker, international aid at [employer], and is on leadership role and teaching     Current Outpatient Medications:  .  clobetasol ointment (TEMOVATE) 2.09 %, APPLY 1 APPLICATION TO SKIN TWICE DAILY, Disp: , Rfl: 4  Allergies  Allergen Reactions  . Erythromycin Other (See Comments)    Severe abdominal pain  . Penicillins Hives     ROS  Constitutional: Negative for fever or weight change.  Respiratory: Negative for cough and shortness of breath.   Cardiovascular: Negative for chest pain or palpitations.  Gastrointestinal: Negative for abdominal pain, no bowel changes.  Musculoskeletal: Negative for  gait problem or joint swelling.  Skin: Negative for rash.  Neurological: Negative for dizziness or headache.  No other specific complaints in a complete review of systems (except as listed in HPI above).  Objective  Vitals:   08/29/18 0858  BP: 110/70  Pulse: 85  Resp: 16  Temp: (!) 97.3 F (36.3 C)  TempSrc: Temporal  SpO2: 95%  Weight: 177 lb (80.3 kg)  Height: _0  (1.575 m)    Body mass index is 32.37 kg/m.  Physical Exam  Constitutional: Patient appears well-developed and well-nourished. No distress.  HENT: Head: Normocephalic and atraumatic. Ears: B TMs ok, no erythema or effusion Eyes: Conjunctivae and EOM are normal. Pupils are equal, round, and reactive to light. No scleral icterus.  Neck: Normal range of motion. Neck supple. No JVD present. No thyromegaly present.  Cardiovascular: Normal rate, regular rhythm and normal heart sounds.  No murmur heard. No BLE edema. Pulmonary/Chest: Effort normal and breath sounds normal. No respiratory distress. Abdominal: Soft. Bowel sounds are normal, no distension. There is no tenderness. no masses Breast: no lumps or masses, no nipple discharge or rashes FEMALE GENITALIA:  Not done RECTAL: not done Musculoskeletal: Normal range of motion, no joint effusions. No gross deformities Neurological: he is alert and oriented to person, place, and time. No cranial nerve deficit. Coordination, balance, strength, speech and gait are normal.  Skin: Skin is warm and dry. No rash noted. No erythema.  Psychiatric: Patient has a normal mood and affect. behavior is normal. Judgment and thought content normal.  PHQ2/9: Depression screen [facility] 2/9 08/29/2018 06/23/2018 03/08/2018 08/25/2017 10/10/2014  Decreased Interest 0 0 0 0 0  Down, Depressed, Hopeless 0 0 0 0 0  PHQ - 2 Score 0 0 0 0 0  Altered sleeping 0 0 - - -  Tired, decreased energy  0 0 - - -  Change in appetite 0 0 - - -  Feeling bad or failure about yourself  0 0 - - -  Trouble  concentrating 0 0 - - -  Moving slowly or fidgety/restless 0 0 - - -  Suicidal thoughts 0 0 - - -  PHQ-9 Score 0 0 - - -  Difficult doing work/chores - Not difficult at all - - -   Negative   Fall Risk: Fall Risk  08/29/2018 06/23/2018 03/08/2018 08/25/2017 10/10/2014  Falls in the past year? 0 0 0 No No  Number falls in past yr: 0 0 0 - -  Injury with Fall? 0 0 0 - -     Functional Status Survey: Is the patient deaf or have difficulty hearing?: No Does the patient have difficulty seeing, even when wearing glasses/contacts?: No Does the patient have difficulty concentrating, remembering, or making decisions?: No Does the patient have difficulty walking or climbing stairs?: No Does the patient have difficulty dressing or bathing?: No Does the patient have difficulty doing errands alone such as visiting a doctor's office or shopping?: No   Assessment & Plan  1. Well woman exam  - CBC with Differential/Platelet - COMPLETE METABOLIC PANEL WITH GFR  2. Vitamin D deficiency  - VITAMIN D 25 Hydroxy (Vit-D Deficiency, Fractures)  3. Elevated prolactin level (HCC)  Recheck today   4. Cervical cancer screening  Up to date   5. Breast cancer screening  - MM Digital Screening; Future  6. Need for hepatitis C screening test  - Hepatitis C antibody  7. Diabetes mellitus screening  - Hemoglobin A1c  8. Lipid screening  - Lipid panel   9. Obesity (BMI 35.0-39.9 without comorbidity)  Discussed with the patient the risk posed by an increased BMI. Discussed importance of portion control, calorie counting and at least 150 minutes of physical activity weekly. Avoid sweet beverages and drink more water. Eat at least 6 servings of fruit and vegetables daily  Discussed carbohydrate restrictive diet   10. Weight gain  - TSH - Insulin, Free (Bioactive)   -USPSTF grade A and B recommendations reviewed with patient; age-appropriate recommendations, preventive care, screening  tests, etc discussed and encouraged; healthy living encouraged; see AVS for patient education given to patient -Discussed importance of 150 minutes of physical activity weekly, eat two servings of fish weekly, eat one serving of tree nuts ( cashews, pistachios, pecans, almonds.Marland Kitchen) every other day, eat 6 servings of fruit/vegetables daily and drink plenty of water and avoid sweet beverages.

## 2018-09-01 LAB — COMPLETE METABOLIC PANEL WITH GFR
AG Ratio: 1.8 (calc) (ref 1.0–2.5)
ALT: 13 U/L (ref 6–29)
AST: 17 U/L (ref 10–35)
Albumin: 4.5 g/dL (ref 3.6–5.1)
Alkaline phosphatase (APISO): 82 U/L (ref 31–125)
BUN: 22 mg/dL (ref 7–25)
CO2: 24 mmol/L (ref 20–32)
Calcium: 9.3 mg/dL (ref 8.6–10.2)
Chloride: 106 mmol/L (ref 98–110)
Creat: 0.71 mg/dL (ref 0.50–1.10)
GFR, Est African American: 117 mL/min/{1.73_m2} (ref >=60)
GFR, Est Non African American: 101 mL/min/{1.73_m2} (ref >=60)
Globulin: 2.5 g/dL (calc) (ref 1.9–3.7)
Glucose, Bld: 88 mg/dL (ref 65–99)
Potassium: 4.3 mmol/L (ref 3.5–5.3)
Sodium: 138 mmol/L (ref 135–146)
Total Bilirubin: 0.5 mg/dL (ref 0.2–1.2)
Total Protein: 7 g/dL (ref 6.1–8.1)

## 2018-09-01 LAB — CBC WITH DIFFERENTIAL/PLATELET
Absolute Monocytes: 460 cells/uL (ref 200–950)
Basophils Absolute: 29 cells/uL (ref 0–200)
Basophils Relative: 0.4 %
Eosinophils Absolute: 263 cells/uL (ref 15–500)
Eosinophils Relative: 3.6 %
HCT: 42.5 % (ref 35.0–45.0)
Hemoglobin: 14.7 g/dL (ref 11.7–15.5)
Lymphs Abs: 1847 cells/uL (ref 850–3900)
MCH: 30.2 pg (ref 27.0–33.0)
MCHC: 34.6 g/dL (ref 32.0–36.0)
MCV: 87.3 fL (ref 80.0–100.0)
MPV: 10.2 fL (ref 7.5–12.5)
Monocytes Relative: 6.3 %
Neutro Abs: 4701 cells/uL (ref 1500–7800)
Neutrophils Relative %: 64.4 %
Platelets: 269 10*3/uL (ref 140–400)
RBC: 4.87 10*6/uL (ref 3.80–5.10)
RDW: 13 % (ref 11.0–15.0)
Total Lymphocyte: 25.3 %
WBC: 7.3 10*3/uL (ref 3.8–10.8)

## 2018-09-01 LAB — HEPATITIS C ANTIBODY
Hepatitis C Ab: NONREACTIVE
SIGNAL TO CUT-OFF: 0.01 (ref <1.00)

## 2018-09-01 LAB — HEMOGLOBIN A1C
Hgb A1c MFr Bld: 5.1 % of total Hgb (ref <5.7)
Mean Plasma Glucose: 100 (calc)
eAG (mmol/L): 5.5 (calc)

## 2018-09-01 LAB — LIPID PANEL
Cholesterol: 200 mg/dL — ABNORMAL HIGH (ref <200)
HDL: 58 mg/dL (ref >=50)
LDL Cholesterol (Calc): 122 mg/dL (calc) — ABNORMAL HIGH
Non-HDL Cholesterol (Calc): 142 mg/dL (calc) — ABNORMAL HIGH (ref <130)
Total CHOL/HDL Ratio: 3.4 (calc) (ref <5.0)
Triglycerides: 97 mg/dL (ref <150)

## 2018-09-01 LAB — INSULIN, FREE (BIOACTIVE): Insulin, Free: 7.2 u[IU]/mL (ref 1.5–14.9)

## 2018-09-01 LAB — VITAMIN D 25 HYDROXY (VIT D DEFICIENCY, FRACTURES): Vit D, 25-Hydroxy: 40 ng/mL (ref 30–100)

## 2018-09-01 LAB — TSH: TSH: 1.75 mIU/L

## 2018-09-29 DIAGNOSIS — M7122 Synovial cyst of popliteal space [Baker], left knee: Secondary | ICD-10-CM | POA: Diagnosis not present

## 2018-10-13 DIAGNOSIS — M7122 Synovial cyst of popliteal space [Baker], left knee: Secondary | ICD-10-CM | POA: Diagnosis not present

## 2018-10-13 DIAGNOSIS — M1712 Unilateral primary osteoarthritis, left knee: Secondary | ICD-10-CM | POA: Diagnosis not present

## 2018-10-13 DIAGNOSIS — M25562 Pain in left knee: Secondary | ICD-10-CM | POA: Diagnosis not present

## 2018-10-17 DIAGNOSIS — L814 Other melanin hyperpigmentation: Secondary | ICD-10-CM | POA: Diagnosis not present

## 2018-10-17 DIAGNOSIS — D225 Melanocytic nevi of trunk: Secondary | ICD-10-CM | POA: Diagnosis not present

## 2018-10-17 DIAGNOSIS — L570 Actinic keratosis: Secondary | ICD-10-CM | POA: Diagnosis not present

## 2018-10-17 DIAGNOSIS — D2262 Melanocytic nevi of left upper limb, including shoulder: Secondary | ICD-10-CM | POA: Diagnosis not present

## 2018-10-17 DIAGNOSIS — L2089 Other atopic dermatitis: Secondary | ICD-10-CM | POA: Diagnosis not present

## 2018-10-22 DIAGNOSIS — M1712 Unilateral primary osteoarthritis, left knee: Secondary | ICD-10-CM | POA: Diagnosis not present

## 2018-10-25 ENCOUNTER — Ambulatory Visit (INDEPENDENT_AMBULATORY_CARE_PROVIDER_SITE_OTHER): Payer: BC Managed Care – PPO

## 2018-10-25 ENCOUNTER — Other Ambulatory Visit: Payer: Self-pay

## 2018-10-25 DIAGNOSIS — Z23 Encounter for immunization: Secondary | ICD-10-CM | POA: Diagnosis not present

## 2018-10-31 DIAGNOSIS — M7122 Synovial cyst of popliteal space [Baker], left knee: Secondary | ICD-10-CM | POA: Diagnosis not present

## 2018-10-31 DIAGNOSIS — S83232A Complex tear of medial meniscus, current injury, left knee, initial encounter: Secondary | ICD-10-CM | POA: Diagnosis not present

## 2018-10-31 DIAGNOSIS — S83249A Other tear of medial meniscus, current injury, unspecified knee, initial encounter: Secondary | ICD-10-CM | POA: Insufficient documentation

## 2018-11-20 HISTORY — PX: OTHER SURGICAL HISTORY: SHX169

## 2018-11-21 DIAGNOSIS — M94262 Chondromalacia, left knee: Secondary | ICD-10-CM | POA: Diagnosis not present

## 2018-11-21 DIAGNOSIS — S83232A Complex tear of medial meniscus, current injury, left knee, initial encounter: Secondary | ICD-10-CM | POA: Diagnosis not present

## 2018-11-28 DIAGNOSIS — M25562 Pain in left knee: Secondary | ICD-10-CM | POA: Insufficient documentation

## 2018-12-06 DIAGNOSIS — M25562 Pain in left knee: Secondary | ICD-10-CM | POA: Diagnosis not present

## 2018-12-08 DIAGNOSIS — M25572 Pain in left ankle and joints of left foot: Secondary | ICD-10-CM | POA: Diagnosis not present

## 2018-12-12 DIAGNOSIS — M25562 Pain in left knee: Secondary | ICD-10-CM | POA: Diagnosis not present

## 2018-12-14 ENCOUNTER — Other Ambulatory Visit: Payer: Self-pay

## 2018-12-14 ENCOUNTER — Telehealth: Payer: BLUE CROSS/BLUE SHIELD | Admitting: Medical

## 2018-12-14 ENCOUNTER — Telehealth: Payer: BC Managed Care – PPO | Admitting: Family

## 2018-12-14 DIAGNOSIS — H00015 Hordeolum externum left lower eyelid: Secondary | ICD-10-CM

## 2018-12-14 MED ORDER — BACITRACIN-POLYMYXIN B 500-10000 UNIT/GM OP OINT: 1.0000 "application " | TOPICAL_OINTMENT | Freq: Three times a day (TID) | OPHTHALMIC | 0 refills | Status: DC

## 2018-12-14 NOTE — Progress Notes (Signed)
We are sorry that you are not feeling well. Here is how we plan to help!  Based on what you have shared with me it looks like you have a stye.  A stye is an inflammation of the eyelid.  It is often a red, painful lump near the edge of the eyelid that may look like a boil or a pimple.  A stye develops when an infection occurs at the base of an eyelash.   We have made appropriate suggestions for you based upon your presentation: Your symptoms may indicate an infection of the sclera.  The use of anti-inflammatory and antibiotic eye drops for a week will help resolve this condition.  I have sent in bacitracin-polymyxin ophthalmic ointment that you will place on the lid three times a day.   If your symptoms do not improve over the next two to three days you should be seen in your doctor's office.  HOME CARE:   Wash your hands often!  Let the stye open on its own. Don't squeeze or open it.  Don't rub your eyes. This can irritate your eyes and let in bacteria.  If you need to touch your eyes, wash your hands first.  Don't wear eye makeup or contact lenses until the area has healed.  GET HELP RIGHT AWAY IF:   Your symptoms do not improve.  You develop blurred or loss of vision.  Your symptoms worsen (increased discharge, pain or redness).  Thank you for choosing an e-visit.  Your e-visit answers were reviewed by a board certified advanced clinical practitioner to complete your personal care plan.  Depending upon the condition, your plan could have included both over the counter or prescription medications.  Please review your pharmacy choice.  Make sure the pharmacy is open so you can pick up prescription now.  If there is a problem, you may contact your provider through [employer] and have the prescription routed to another pharmacy.    Your safety is important to [place].  If you have drug allergies check your prescription carefully.  For the next 24 hours you can use MyChart to ask  questions about today's visit, request a non-urgent call back, or ask for a work or school excuse.  You will get an email in the next two days asking about your experience.  I hope you that your e-visit has been valuable and will speed your recovery.  Approximately 5 minutes was spent documenting and reviewing patient's chart.

## 2018-12-19 ENCOUNTER — Ambulatory Visit: Payer: BLUE CROSS/BLUE SHIELD | Admitting: Medical

## 2018-12-19 ENCOUNTER — Other Ambulatory Visit: Payer: Self-pay

## 2018-12-19 ENCOUNTER — Telehealth: Payer: Self-pay | Admitting: Medical

## 2018-12-19 ENCOUNTER — Encounter: Payer: Self-pay | Admitting: *Deleted

## 2018-12-19 DIAGNOSIS — H1032 Unspecified acute conjunctivitis, left eye: Secondary | ICD-10-CM

## 2018-12-20 DIAGNOSIS — M25562 Pain in left knee: Secondary | ICD-10-CM | POA: Diagnosis not present

## 2018-12-20 NOTE — Progress Notes (Signed)
Permission to treat patient through telemedicine.  Allergies  Allergen Reactions  . Erythromycin Other (See Comments)    Severe abdominal pain  . Penicillins Hives    48 yo female in non acute distress. Returns my call for follow up on left eye conjunctivitis. She states on Saturday it got worse with swelling of the lower lid and cheek area, she used cool compresses and the Cipro opth drops as directed.  Today she feels it is much improved. She denies any fever or chills, visual changes, difficulty with eye movements, erythema and pain or discharge from the eye. The area has now decreased in swelling and the  Upper cheek is back to normal size. She denies any visual changes. She now feels a small "bead" like swelling on the medial side of the lower lid. She has sent  me a follow up  picture of the eye.   She also feels this was not a sty, which I  agree. I reviewed with the patient that sty's do not move, and most likely is eye gland swelling.   Left eye conjunctivitis bacterial vs viral. The photo shows Left eye with  mild swelling at the inner corner of the eye (medially), including swelling on upper lid corner on the medial side ( nasal). There is no erythema and patient denies any pain. And there is no discharge.   She is to continue the eye drops for  A total of 7 -10 days. I reviewed signs and symptoms of when she needs to see out urgent care.  To call or return to the clinic if any concerns.She verbalizes understanding and has no questions at the end of our conversation.

## 2018-12-23 DIAGNOSIS — M25562 Pain in left knee: Secondary | ICD-10-CM | POA: Diagnosis not present

## 2018-12-26 DIAGNOSIS — M25562 Pain in left knee: Secondary | ICD-10-CM | POA: Diagnosis not present

## 2018-12-28 DIAGNOSIS — E559 Vitamin D deficiency, unspecified: Secondary | ICD-10-CM | POA: Diagnosis not present

## 2018-12-28 DIAGNOSIS — N926 Irregular menstruation, unspecified: Secondary | ICD-10-CM | POA: Diagnosis not present

## 2018-12-28 DIAGNOSIS — Z1231 Encounter for screening mammogram for malignant neoplasm of breast: Secondary | ICD-10-CM | POA: Diagnosis not present

## 2018-12-28 DIAGNOSIS — Z8742 Personal history of other diseases of the female genital tract: Secondary | ICD-10-CM | POA: Diagnosis not present

## 2018-12-28 DIAGNOSIS — N939 Abnormal uterine and vaginal bleeding, unspecified: Secondary | ICD-10-CM | POA: Diagnosis not present

## 2018-12-28 DIAGNOSIS — Z01419 Encounter for gynecological examination (general) (routine) without abnormal findings: Secondary | ICD-10-CM | POA: Diagnosis not present

## 2018-12-28 DIAGNOSIS — Z6832 Body mass index (BMI) 32.0-32.9, adult: Secondary | ICD-10-CM | POA: Diagnosis not present

## 2018-12-28 LAB — HM MAMMOGRAPHY

## 2018-12-29 DIAGNOSIS — M25562 Pain in left knee: Secondary | ICD-10-CM | POA: Diagnosis not present

## 2019-01-03 LAB — HM PAP SMEAR
HM Pap smear: NEGATIVE
HM Pap smear: NEGATIVE

## 2019-01-04 DIAGNOSIS — M25562 Pain in left knee: Secondary | ICD-10-CM | POA: Diagnosis not present

## 2019-01-10 DIAGNOSIS — M7122 Synovial cyst of popliteal space [Baker], left knee: Secondary | ICD-10-CM | POA: Diagnosis not present

## 2019-02-03 DIAGNOSIS — Z20828 Contact with and (suspected) exposure to other viral communicable diseases: Secondary | ICD-10-CM | POA: Diagnosis not present

## 2019-02-03 DIAGNOSIS — U071 COVID-19: Secondary | ICD-10-CM | POA: Diagnosis not present

## 2019-02-03 DIAGNOSIS — Z03818 Encounter for observation for suspected exposure to other biological agents ruled out: Secondary | ICD-10-CM | POA: Diagnosis not present

## 2019-02-20 DIAGNOSIS — M25562 Pain in left knee: Secondary | ICD-10-CM | POA: Diagnosis not present

## 2019-03-13 DIAGNOSIS — M1712 Unilateral primary osteoarthritis, left knee: Secondary | ICD-10-CM | POA: Diagnosis not present

## 2019-03-23 DIAGNOSIS — M1712 Unilateral primary osteoarthritis, left knee: Secondary | ICD-10-CM | POA: Diagnosis not present

## 2019-04-27 DIAGNOSIS — M25562 Pain in left knee: Secondary | ICD-10-CM | POA: Diagnosis not present

## 2019-04-27 DIAGNOSIS — M1712 Unilateral primary osteoarthritis, left knee: Secondary | ICD-10-CM | POA: Diagnosis not present

## 2019-04-28 DIAGNOSIS — E559 Vitamin D deficiency, unspecified: Secondary | ICD-10-CM | POA: Diagnosis not present

## 2019-05-03 ENCOUNTER — Other Ambulatory Visit: Payer: Self-pay

## 2019-05-03 ENCOUNTER — Encounter: Payer: Self-pay | Admitting: Nurse Practitioner

## 2019-05-03 ENCOUNTER — Telehealth: Payer: Self-pay | Admitting: Nurse Practitioner

## 2019-05-03 DIAGNOSIS — H109 Unspecified conjunctivitis: Secondary | ICD-10-CM

## 2019-05-03 MED ORDER — POLYMYXIN B-TRIMETHOPRIM 10000-0.1 UNIT/ML-% OP SOLN: 1.0000 [drp] | OPHTHALMIC | 0 refills | Status: AC

## 2019-05-03 NOTE — Patient Instructions (Addendum)
Please make sure you maintain adequate hand hygiene when touching your eyes Start applying a warm compress to the left eye Take the eye drops as directed Encouraged patient to call the office or primary care doctor for an appointment if no improvement in symptoms or if symptoms change or worsen after 72 hours of planned treatment. Patient verbalized understanding of all instructions given/reviewed and has no further questions or concerns at this time.     Bacterial Conjunctivitis, Adult Bacterial conjunctivitis is an infection of your conjunctiva. This is the clear membrane that covers the white part of your eye and the inner part of your eyelid. This infection can make your eye:  Red or pink.  Itchy. This condition spreads easily from person to person (is contagious) and from one eye to the other eye. What are the causes?  This condition is caused by germs (bacteria). You may get the infection if you come into close contact with: ? A person who has the infection. ? Items that have germs on them (are contaminated), such as face towels, contact lens solution, or eye makeup. What increases the risk? You are more likely to get this condition if you:  Have contact with people who have the infection.  Wear contact lenses.  Have a sinus infection.  Have had a recent eye injury or surgery.  Have a weak body defense system (immune system).  Have dry eyes. What are the signs or symptoms?    Thick, yellowish discharge from the eye.  Tearing or watery eyes.  Itchy eyes.  Burning feeling in your eyes.  Eye redness.  Swollen eyelids.  Blurred vision. How is this treated?    Antibiotic eye drops or ointment.  Antibiotic medicine taken by mouth. This is used for infections that do not get better with drops or ointment or that last more than 10 days.  Cool, wet cloths placed on the eyes.  Artificial tears used 2-6 times a day. Follow these instructions at  home: Medicines  Take or apply your antibiotic medicine as told by your doctor. Do not stop taking or applying the antibiotic even if you start to feel better.  Take or apply over-the-counter and prescription medicines only as told by your doctor.  Do not touch your eyelid with the eye-drop bottle or the ointment tube. Managing discomfort  Wipe any fluid from your eye with a warm, wet washcloth or a cotton ball.  Place a clean, cool, wet cloth on your eye. Do this for 10-20 minutes, 3-4 times per day. General instructions  Do not wear contacts until the infection is gone. Wear glasses until your doctor says it is okay to wear contacts again.  Do not wear eye makeup until the infection is gone. Throw away old eye makeup.  Change or wash your pillowcase every day.  Do not share towels or washcloths.  Wash your hands often with soap and water. Use paper towels to dry your hands.  Do not touch or rub your eyes.  Do not drive or use heavy machinery if your vision is blurred. Contact a doctor if:  You have a fever.  You do not get better after 10 days. Get help right away if:  You have a fever and your symptoms get worse all of a sudden.  You have very bad pain when you move your eye.  Your face: ? Hurts. ? Is red. ? Is swollen.  You have sudden loss of vision. Summary  Bacterial conjunctivitis is an  infection of your conjunctiva.  This infection spreads easily from person to person.  Wash your hands often with soap and water. Use paper towels to dry your hands.  Take or apply your antibiotic medicine as told by your doctor.  Contact a doctor if you have a fever or you do not get better after 10 days. This information is not intended to replace advice given to you by your health care provider. Make sure you discuss any questions you have with your health care provider. Document Revised: 04/26/2018 Document Reviewed: 08/11/2017 Elsevier Patient Education  [place].

## 2019-05-03 NOTE — Progress Notes (Signed)
   Subjective:    Patient ID: [name], female    DOB: [birth year], 49 y.o.   MRN: [id number]  HPI Dorotha is on a telehealth visit today and consents to treatment. She reports her left eye with contact green/yellow discharge, upper lid swelling and crustations of the eye lashes for 2 1/2 weeks. She reports that she initially thought it was allergies and took an OTC allergy visine, then switching to [place]. In the last 5 days she has been taking Cipro gtts BID with only some relief in lid swelling and no significant improvement. She does reports a hx of eczema and years ago she got eczema in her "eye" but can't remember the presentation. She denies itching, pain, redness or change in her vision. She does report recent sneezing which is why she started the Claritin but no sinus issues or drainage.     Review of Systems  HENT: Positive for sneezing.   Eyes: Positive for discharge. Negative for pain, redness, itching and visual disturbance.       Left eye green/yellow discharge, matting of eyelashes and upper lid swelling.       Objective:   Physical Exam Constitutional:      Appearance: Normal appearance.  HENT:     Head: Normocephalic and atraumatic.     Comments: Left inner canthus with purulent discharge, noted patient clearing crustations from lashes, and mild upper lid edema. Difficult exam to visualize eye but no evident erythema. Pulmonary:     Effort: Pulmonary effort is normal.  Skin:    Comments: No evidence of diaphoresis  Neurological:     General: No focal deficit present.     Mental Status: She is alert and oriented to person, place, and time.  Psychiatric:        Mood and Affect: Mood normal.           Assessment & Plan:

## 2019-05-05 DIAGNOSIS — M1712 Unilateral primary osteoarthritis, left knee: Secondary | ICD-10-CM | POA: Diagnosis not present

## 2019-05-12 DIAGNOSIS — H01003 Unspecified blepharitis right eye, unspecified eyelid: Secondary | ICD-10-CM | POA: Diagnosis not present

## 2019-05-12 DIAGNOSIS — M1712 Unilateral primary osteoarthritis, left knee: Secondary | ICD-10-CM | POA: Diagnosis not present

## 2019-05-12 NOTE — Progress Notes (Signed)
Patient contacted our clinic c/o eye crusting and mucous drainage.  She has been treated several times in our clinic for this problem and it hasn't resolved.  Today we will refer her to [facility] for follow up.  I spoke with the office and she has an appointment scheduled for 3:15 pm today.  Patient verbalizes understanding of appointment time.

## 2019-05-19 DIAGNOSIS — M1712 Unilateral primary osteoarthritis, left knee: Secondary | ICD-10-CM | POA: Diagnosis not present

## 2019-05-26 DIAGNOSIS — M7122 Synovial cyst of popliteal space [Baker], left knee: Secondary | ICD-10-CM | POA: Diagnosis not present

## 2019-06-02 ENCOUNTER — Ambulatory Visit: Payer: Self-pay | Attending: Internal Medicine

## 2019-06-09 ENCOUNTER — Ambulatory Visit: Payer: Self-pay | Admitting: Podiatry

## 2019-06-09 DIAGNOSIS — M1712 Unilateral primary osteoarthritis, left knee: Secondary | ICD-10-CM | POA: Diagnosis not present

## 2019-06-16 ENCOUNTER — Ambulatory Visit: Payer: Self-pay | Admitting: Podiatry

## 2019-06-22 ENCOUNTER — Other Ambulatory Visit: Payer: Self-pay

## 2019-06-22 ENCOUNTER — Ambulatory Visit: Payer: Self-pay | Admitting: Nurse Practitioner

## 2019-06-22 ENCOUNTER — Encounter: Payer: Self-pay | Admitting: Nurse Practitioner

## 2019-06-22 VITALS — BP 122/66 | HR 78 | Temp 97.1°F | Resp 20 | Ht 61.0 in | Wt 176.0 lb

## 2019-06-22 DIAGNOSIS — R300 Dysuria: Secondary | ICD-10-CM

## 2019-06-22 DIAGNOSIS — R311 Benign essential microscopic hematuria: Secondary | ICD-10-CM

## 2019-06-22 DIAGNOSIS — N3001 Acute cystitis with hematuria: Secondary | ICD-10-CM

## 2019-06-22 MED ORDER — NITROFURANTOIN MONOHYD MACRO 100 MG PO CAPS: 100.0000 mg | ORAL_CAPSULE | Freq: Two times a day (BID) | ORAL | 0 refills | Status: DC

## 2019-06-22 NOTE — Patient Instructions (Addendum)
Juna, I will send your urine out for culture Take the antibiotics with food Continue to hydrate with plenty of fluids Start any over the counter antihistamine of your choice we discussed to help with the fluid behind your left ear Encouraged patient to call the office or primary care doctor for an appointment if no improvement in symptoms or if symptoms change or worsen after 72 hours of planned treatment. Patient verbalized understanding of all instructions given/reviewed and has no further questions or concerns at this time.     Dysuria Dysuria is pain or discomfort while urinating. The pain or discomfort may be felt in the part of your body that drains urine from the bladder (urethra) or in the surrounding tissue of the genitals. The pain may also be felt in the groin area, lower abdomen, or lower back. You may have to urinate frequently or have the sudden feeling that you have to urinate (urgency). Dysuria can affect both men and women, but it is more common in women. Dysuria can be caused by many different things, including:  Urinary tract infection.  Kidney stones or bladder stones.  Certain sexually transmitted infections (STIs), such as chlamydia.  Dehydration.  Inflammation of the tissues of the vagina.  Use of certain medicines.  Use of certain soaps or scented products that cause irritation. Follow these instructions at home: General instructions  Watch your condition for any changes.  Urinate often. Avoid holding urine for long periods of time.  After a bowel movement or urination, women should cleanse from front to back, using each tissue only once.  Urinate after sexual intercourse.  Keep all follow-up visits as told by your health care provider. This is important.  If you had any tests done to find the cause of dysuria, it is up to you to get your test results. Ask your health care provider, or the department that is doing the test, when your results will be  ready. Eating and drinking    Drink enough fluid to keep your urine pale yellow.  Avoid caffeine, tea, and alcohol. They can irritate the bladder and make dysuria worse. In men, alcohol may irritate the prostate. Medicines  Take over-the-counter and prescription medicines only as told by your health care provider.  If you were prescribed an antibiotic medicine, take it as told by your health care provider. Do not stop taking the antibiotic even if you start to feel better. Contact a health care provider if:  You have a fever.  You develop pain in your back or sides.  You have nausea or vomiting.  You have blood in your urine.  You are not urinating as often as you usually do. Get help right away if:  Your pain is severe and not relieved with medicines.  You cannot eat or drink without vomiting.  You are confused.  You have a rapid heartbeat while at rest.  You have shaking or chills.  You feel extremely weak. Summary  Dysuria is pain or discomfort while urinating. Many different conditions can lead to dysuria.  If you have dysuria, you may have to urinate frequently or have the sudden feeling that you have to urinate (urgency).  Watch your condition for any changes. Keep all follow-up visits as told by your health care provider.  Make sure that you urinate often and drink enough fluid to keep your urine pale yellow. This information is not intended to replace advice given to you by your health care provider. Make sure you  discuss any questions you have with your health care provider. Document Revised: 12/18/2016 Document Reviewed: 10/22/2016 Elsevier Patient Education  2020 [employer].

## 2019-06-22 NOTE — Progress Notes (Signed)
   Subjective:    Patient ID: [name], female    DOB: 02/18/1970, 49 y.o.   MRN: [id number]  HPI [name] comes to the [facility] clinic with c/o urinary burning, frequency/urgency and pain x 6 days. She denies any blood in urine, vaginal discharge, cloudy or odorous urine, back pain or abdominal pain or fever. She reports she's been hydrating well. Reports kidney stones last time about 10 years ago and was seeing a urologist at that time, but none since and does not have the flank/back pain associated with hx of stones.  Also wishes to have throat checked because her throat feels "scratchy".  LMP: 01/2019 which she reports her gyn causes her to have them with progesterone and hasn't fully hit menopause.  Review of Systems  Constitutional: Negative for fever.  HENT:       Throat discomfort  Gastrointestinal: Negative for abdominal pain.  Genitourinary: Positive for dysuria, frequency and urgency. Negative for hematuria and vaginal discharge.       Objective:   Physical Exam Constitutional:      Appearance: Normal appearance.  HENT:     Head: Normocephalic and atraumatic.     Right Ear: Ear canal normal.     Left Ear: Ear canal normal.     Ears:     Comments: Left nonerythematous TM intact with clear fluid and bubbles. Right TM WNL. Bilateral EAC's clear.    Mouth/Throat:     Comments: Mildly injected pharnyx Cardiovascular:     Rate and Rhythm: Normal rate.     Heart sounds: Normal heart sounds.  Pulmonary:     Effort: Pulmonary effort is normal.     Breath sounds: Normal breath sounds.  Abdominal:     Palpations: Abdomen is soft.     Tenderness: There is abdominal tenderness. There is left CVA tenderness. There is no right CVA tenderness.     Comments: Some tenderness to lower abdomen/suprapubic area but to note reports discomfort since abdominoplasty 6 years ago  Musculoskeletal:     Cervical back: Neck supple.  Skin:    General: Skin is warm and dry.  Neurological:   Mental Status: She is alert and oriented to person, place, and time.  Psychiatric:        Mood and Affect: Mood normal.        Behavior: Behavior normal.           Assessment & Plan:

## 2019-06-23 ENCOUNTER — Encounter: Payer: Self-pay | Admitting: Nurse Practitioner

## 2019-06-23 LAB — URINALYSIS, ROUTINE W REFLEX MICROSCOPIC
Bilirubin, UA: NEGATIVE
Glucose, UA: NEGATIVE
Ketones, UA: NEGATIVE
Leukocytes,UA: NEGATIVE
Nitrite, UA: NEGATIVE
Protein,UA: NEGATIVE
RBC, UA: NEGATIVE
Specific Gravity, UA: 1.018 (ref 1.005–1.030)
Urobilinogen, Ur: 0.2 mg/dL (ref 0.2–1.0)
pH, UA: 6 (ref 5.0–7.5)

## 2019-06-24 LAB — URINE CULTURE

## 2019-06-27 ENCOUNTER — Other Ambulatory Visit: Payer: Self-pay | Admitting: Nurse Practitioner

## 2019-07-17 DIAGNOSIS — M25462 Effusion, left knee: Secondary | ICD-10-CM | POA: Diagnosis not present

## 2019-07-17 DIAGNOSIS — M1712 Unilateral primary osteoarthritis, left knee: Secondary | ICD-10-CM | POA: Diagnosis not present

## 2019-07-17 DIAGNOSIS — G8929 Other chronic pain: Secondary | ICD-10-CM | POA: Diagnosis not present

## 2019-07-17 DIAGNOSIS — M25562 Pain in left knee: Secondary | ICD-10-CM | POA: Diagnosis not present

## 2019-07-21 DIAGNOSIS — S83232A Complex tear of medial meniscus, current injury, left knee, initial encounter: Secondary | ICD-10-CM | POA: Diagnosis not present

## 2019-07-21 DIAGNOSIS — X58XXXA Exposure to other specified factors, initial encounter: Secondary | ICD-10-CM | POA: Diagnosis not present

## 2019-07-21 DIAGNOSIS — M65862 Other synovitis and tenosynovitis, left lower leg: Secondary | ICD-10-CM | POA: Diagnosis not present

## 2019-07-21 DIAGNOSIS — G8929 Other chronic pain: Secondary | ICD-10-CM | POA: Diagnosis not present

## 2019-07-21 DIAGNOSIS — M25462 Effusion, left knee: Secondary | ICD-10-CM | POA: Diagnosis not present

## 2019-07-21 DIAGNOSIS — M25562 Pain in left knee: Secondary | ICD-10-CM | POA: Diagnosis not present

## 2019-08-22 ENCOUNTER — Ambulatory Visit (INDEPENDENT_AMBULATORY_CARE_PROVIDER_SITE_OTHER): Payer: BC Managed Care – PPO

## 2019-08-22 ENCOUNTER — Other Ambulatory Visit: Payer: Self-pay

## 2019-08-22 ENCOUNTER — Encounter: Payer: Self-pay | Admitting: Podiatry

## 2019-08-22 ENCOUNTER — Ambulatory Visit: Payer: BC Managed Care – PPO | Admitting: Podiatry

## 2019-08-22 DIAGNOSIS — M722 Plantar fascial fibromatosis: Secondary | ICD-10-CM | POA: Diagnosis not present

## 2019-08-22 NOTE — Progress Notes (Signed)
   Subjective: 49 y.o. female presenting today for follow-up evaluation of plantar fasciitis to the bilateral heels.  Patient states that recently she has had more pain associated to her left knee.  She has a history of left knee arthroscopy with meniscectomy in November 2020.  She says that she did not improve after surgery and she is now looking at having a total or partial knee replacement.   She believes that reducing her activity because of the knee has helped her feet. She wears good supportive shoes and orthotics.  She presents today because she needs a new pair of orthotics.  She has an appointment with the Pedorthist tomorrow, 08/23/2019, to be scanned for new custom molded orthotics.   Past Medical History:  Diagnosis Date  . Allergy   . Anemia    Childhood  . Arthritis   . Asthma    exercise induced  . Back pain    bulging disc  . Back pain, chronic   . Cellulitis   . Clinical depression   . Eczema   . Endometritis   . Fatigue   . Headache   . Hernia    Umbilical  . History of chicken pox   . Insomnia   . Irregular menses   . Kidney stones   . Ovarian cyst   . Over weight   . Rectus diastasis   . Right hip pain   . Yeast infection      Objective: Physical Exam General: The patient is alert and oriented x3 in no acute distress.  Dermatology: Skin is warm, dry and supple bilateral lower extremities. Negative for open lesions or macerations bilateral.   Vascular: Dorsalis Pedis and Posterior Tibial pulses palpable bilateral.  Capillary fill time is immediate to all digits.  Neurological: Epicritic and protective threshold intact bilateral.   Musculoskeletal: Tenderness to palpation to the plantar aspect of the bilateral heels along the plantar fascia. All other joints range of motion within normal limits bilateral. Strength 5/5 in all groups bilateral.   Radiographic exam: Normal osseous mineralization. Joint spaces preserved. No fracture/dislocation/boney  destruction. No other soft tissue abnormalities or radiopaque foreign bodies.   Assessment: 1. plantar fasciitis bilateral feet 2.  Left knee DJD  Plan of Care:  1. Patient evaluated. Xrays reviewed.   2.  Patient has appointment tomorrow, 08/23/2019, with Pedorthist.  Scanned for new custom orthotics 3.  Continue wearing good supportive sneakers 4.  Return to clinic as needed  *Loves to be active (hiking, playing tennis).  Looking at having knee surgery within the next month.  Bought a pickle ball set 2 years ago and has not used it because of knee and foot pain.  Felecia Shelling, DPM [facility]  Dr. Felecia Shelling, DPM    [address] [place], [place] [address]                Office [phone]  Fax [phone]

## 2019-08-23 ENCOUNTER — Ambulatory Visit (INDEPENDENT_AMBULATORY_CARE_PROVIDER_SITE_OTHER): Payer: BC Managed Care – PPO | Admitting: Orthotics

## 2019-08-23 ENCOUNTER — Other Ambulatory Visit: Payer: Self-pay

## 2019-08-23 DIAGNOSIS — M722 Plantar fascial fibromatosis: Secondary | ICD-10-CM | POA: Diagnosis not present

## 2019-08-23 NOTE — Progress Notes (Signed)
Repeating previous f/o order. 

## 2019-08-24 DIAGNOSIS — M1712 Unilateral primary osteoarthritis, left knee: Secondary | ICD-10-CM | POA: Diagnosis not present

## 2019-08-31 ENCOUNTER — Ambulatory Visit (INDEPENDENT_AMBULATORY_CARE_PROVIDER_SITE_OTHER): Payer: BC Managed Care – PPO | Admitting: Family Medicine

## 2019-08-31 ENCOUNTER — Other Ambulatory Visit: Payer: Self-pay

## 2019-08-31 ENCOUNTER — Encounter: Payer: Self-pay | Admitting: Family Medicine

## 2019-08-31 VITALS — BP 120/80 | HR 85 | Temp 98.2°F | Resp 16 | Ht 62.0 in | Wt 177.7 lb

## 2019-08-31 DIAGNOSIS — M1712 Unilateral primary osteoarthritis, left knee: Secondary | ICD-10-CM

## 2019-08-31 DIAGNOSIS — Z13220 Encounter for screening for lipoid disorders: Secondary | ICD-10-CM

## 2019-08-31 DIAGNOSIS — Z130 Encounter for screening for diseases of the blood and blood-forming organs and certain disorders involving the immune mechanism: Secondary | ICD-10-CM

## 2019-08-31 DIAGNOSIS — E559 Vitamin D deficiency, unspecified: Secondary | ICD-10-CM

## 2019-08-31 DIAGNOSIS — Z131 Encounter for screening for diabetes mellitus: Secondary | ICD-10-CM

## 2019-08-31 DIAGNOSIS — R7989 Other specified abnormal findings of blood chemistry: Secondary | ICD-10-CM | POA: Diagnosis not present

## 2019-08-31 DIAGNOSIS — Z0000 Encounter for general adult medical examination without abnormal findings: Secondary | ICD-10-CM | POA: Diagnosis not present

## 2019-08-31 DIAGNOSIS — Z1211 Encounter for screening for malignant neoplasm of colon: Secondary | ICD-10-CM

## 2019-08-31 NOTE — Addendum Note (Signed)
Addended by: Alba Cory F on: 08/31/2019 09:44 AM   Modules accepted: Orders

## 2019-08-31 NOTE — Patient Instructions (Signed)

## 2019-08-31 NOTE — Progress Notes (Signed)
Name: [name]   MRN: [id number]    DOB: [birth year]   Date:08/31/2019       Progress Note  Subjective  Chief Complaint  Chief Complaint  Patient presents with  . Annual Exam    HPI  Patient presents for annual CPE.  Diet: balanced Exercise: not currently because of left knee pain   USPSTF grade A and B recommendations    Office Visit from 08/31/2019 in [facility]  AUDIT-C Score 1     Depression: Phq 9 is  negative Depression screen [facility] 2/9 08/31/2019 08/29/2018 06/23/2018 03/08/2018 08/25/2017  Decreased Interest 0 0 0 0 0  Down, Depressed, Hopeless 0 0 0 0 0  PHQ - 2 Score 0 0 0 0 0  Altered sleeping 0 0 0 - -  Tired, decreased energy 0 0 0 - -  Change in appetite 0 0 0 - -  Feeling bad or failure about yourself  0 0 0 - -  Trouble concentrating 0 0 0 - -  Moving slowly or fidgety/restless 0 0 0 - -  Suicidal thoughts 0 0 0 - -  PHQ-9 Score 0 0 0 - -  Difficult doing work/chores - - Not difficult at all - -   Hypertension: BP Readings from Last 3 Encounters:  08/31/19 120/80  06/22/19 122/66  08/29/18 110/70   Obesity: Wt Readings from Last 3 Encounters:  08/31/19 177 lb 11.2 oz (80.6 kg)  06/22/19 176 lb (79.8 kg)  08/29/18 177 lb (80.3 kg)   BMI Readings from Last 3 Encounters:  08/31/19 32.50 kg/m  06/22/19 33.25 kg/m  08/29/18 32.37 kg/m     Hep C Screening: 08/2018 STD testing and prevention (HIV/chl/gon/syphilis): N/A Intimate partner violence: negative screen  Sexual History (Partners/Practices/Protection from [employer] hx STI/Pregnancy Plans): one partner  Pain during Intercourse: no  Menstrual History/LMP/Abnormal Bleeding: LMP 01/2019 Incontinence Symptoms: no problems   Breast cancer:  - Last Mammogram: gets from gyn, we will obtain a copy  - BRCA gene screening:mother had breast cancer at age 90, also her maternal aunt at older age, she will think about genetic testing    Osteoporosis: Discussed high calcium and  vitamin D supplementation, weight bearing exercises  Cervical cancer screening: up to date we will obtain a copy   Skin cancer: Discussed monitoring for atypical lesions  Colorectal cancer: referral placed  Lung cancer:  Low Dose CT Chest recommended if Age 58-80 years, 30 pack-year currently smoking OR have quit w/in 15years. Patient does not qualify.   ECG: 02/2018  Advanced Care Planning: A voluntary discussion about advance care planning including the explanation and discussion of advance directives.  Discussed health care proxy and Living will, and the patient was able to identify a health care proxy as husband   Patient does have a living will at present time.   Lipids: Lab Results  Component Value Date   CHOL 200 (H) 08/29/2018   CHOL 176 08/25/2017   CHOL 183 05/19/2016   Lab Results  Component Value Date   HDL 58 08/29/2018   HDL 56 08/25/2017   HDL 72 05/19/2016   Lab Results  Component Value Date   LDLCALC 122 (H) 08/29/2018   LDLCALC 104 (H) 08/25/2017   LDLCALC 97 05/19/2016   Lab Results  Component Value Date   TRIG 97 08/29/2018   TRIG 69 08/25/2017   TRIG 72 05/19/2016   Lab Results  Component Value Date   CHOLHDL 3.4 08/29/2018  CHOLHDL 3.1 08/25/2017   CHOLHDL 2.5 05/19/2016   No results found for: LDLDIRECT  Glucose: Glucose, Bld  Date Value Ref Range Status  08/29/2018 88 65 - 99 mg/dL Final    Comment:    .            Fasting reference interval .   03/08/2018 87 65 - 99 mg/dL Final    Comment:    .            Fasting reference interval .   08/25/2017 88 65 - 99 mg/dL Final    Comment:    .            Fasting reference interval .     Patient Active Problem List   Diagnosis Date Noted  . Pain in left knee 11/28/2018  . Tear of medial meniscus of knee 10/31/2018  . Osteoarthritis of left knee 03/28/2018  . Osteoarthritis of knee 03/28/2018  . Menopausal symptom 06/17/2017  . Irregular periods 06/17/2017  . Dysmenorrhea  06/17/2017  . Drug allergy 06/17/2017  . History of cesarean section 06/17/2017  . Vitamin D deficiency 12/04/2016  . Plantar fasciitis, left 05/19/2016  . Cervical high risk HPV (human papillomavirus) test positive 11/04/2015  . Allergic rhinitis 10/10/2014  . Back pain, chronic 10/10/2014  . Clinical depression 10/10/2014  . Dermatitis, eczematoid 10/10/2014  . Elevated prolactin level 10/10/2014  . Insomnia 10/10/2014  . Umbilical hernia without obstruction and without gangrene 10/10/2014  . H/O allergy to multiple drugs 02/22/2012  . Blood type, Rh negative 02/22/2012  . Previous cesarean delivery x 4 02/22/2012  . Personal history of kidney stones 02/22/2012    Past Surgical History:  Procedure Laterality Date  . CESAREAN SECTION  2003,2005,2008,2010  . FOOT SURGERY     neuroma left foot  . meniscal repair Left 11/2018  . tummy tuck  05/31/2014  . UMBILICAL HERNIA REPAIR  05/31/2014  . VENTRAL HERNIA REPAIR  05/31/2014  . WISDOM TOOTH EXTRACTION      Family History  Problem Relation Age of Onset  . Cancer Mother 63       breast cancer  . Osteoarthritis Mother   . Broken bones Mother        Hip x2  . Varicose Veins Mother   . Hyperlipidemia Father   . Hypertension Father   . Heart attack Father   . Hypertension Paternal Grandmother   . Congenital heart disease Paternal Grandmother   . Stroke Paternal Grandfather   . Kidney disease Daughter   . Hypercholesterolemia Brother   . Colon cancer Maternal Grandmother   . Heart disease Maternal Grandfather   . Hypertension Sister   . Migraines Sister   . Thyroid disease Sister   . GI problems Sister        Extra kink in her large intestine  . Hypercholesterolemia Brother     Social History   Socioeconomic History  . Marital status: Married    Spouse name: Not on file  . Number of children: 4  . Years of education: Not on file  . Highest education level: Doctorate  Occupational History  . Occupation:  Professor    Employer: [employer]    Comment: [place]  Tobacco Use  . Smoking status: Never Smoker  . Smokeless tobacco: Never Used  Vaping Use  . Vaping Use: Never used  Substance and Sexual Activity  . Alcohol use: No  . Drug use: No  . Sexual activity: Yes  Partners: Male    Birth control/protection: Rhythm    Comment: married   Other Topics Concern  . Not on file  Social History Narrative   Married, she has 4 daughters    She works as Development worker, international aid at [employer], and is on Optician, dispensing role and teaching   Social Determinants of Radio broadcast assistant Strain: Babbie   . Difficulty of Paying Living Expenses: Not hard at all  Food Insecurity: No Food Insecurity  . Worried About Charity fundraiser in the Last Year: Never true  . Ran Out of Food in the Last Year: Never true  Transportation Needs: No Transportation Needs  . Lack of Transportation (Medical): No  . Lack of Transportation (Non-Medical): No  Physical Activity: Inactive  . Days of Exercise per Week: 0 days  . Minutes of Exercise per Session: 0 min  Stress: No Stress Concern Present  . Feeling of Stress : Not at all  Social Connections: Socially Integrated  . Frequency of Communication with Friends and Family: More than three times a week  . Frequency of Social Gatherings with Friends and Family: More than three times a week  . Attends Religious Services: More than 4 times per year  . Active Member of Clubs or Organizations: Yes  . Attends Archivist Meetings: More than 4 times per year  . Marital Status: Married  Human resources officer Violence: Not At Risk  . Fear of Current or Ex-Partner: No  . Emotionally Abused: No  . Physically Abused: No  . Sexually Abused: No     Current Outpatient Medications:  .  clobetasol ointment (TEMOVATE) 8.18 %, APPLY 1 APPLICATION TO SKIN TWICE DAILY, Disp: , Rfl: 4  Allergies  Allergen Reactions  . Erythromycin Other (See Comments)    Severe abdominal pain   . Penicillins Hives     ROS  Constitutional: Negative for fever or weight change.  Respiratory: Negative for cough and shortness of breath.   Cardiovascular: Negative for chest pain or palpitations.  Gastrointestinal: Negative for abdominal pain, no bowel changes.  Musculoskeletal: Positive  for gait problem or joint swelling.  Skin: Negative for rash.  Neurological: Negative for dizziness or headache.  No other specific complaints in a complete review of systems (except as listed in HPI above).  Objective  Vitals:   08/31/19 0913  BP: 120/80  Pulse: 85  Resp: 16  Temp: 98.2 F (36.8 C)  TempSrc: Oral  SpO2: 97%  Weight: 177 lb 11.2 oz (80.6 kg)  Height: _0  (1.575 m)    Body mass index is 32.5 kg/m.  Physical Exam  Constitutional: Patient appears well-developed and obesity No distress.  HENT: Head: Normocephalic and atraumatic. Ears: B TMs ok, no erythema or effusion; Nose: Nose normal. Mouth/Throat: not done  Eyes: Conjunctivae and EOM are normal. Pupils are equal, round, and reactive to light. No scleral icterus.  Neck: Normal range of motion. Neck supple. No JVD present. No thyromegaly present.  Cardiovascular: Normal rate, regular rhythm and normal heart sounds.  No murmur heard. No BLE edema. Pulmonary/Chest: Effort normal and breath sounds normal. No respiratory distress. Abdominal: Soft. Bowel sounds are normal, no distension. There is no tenderness. no masses Breast: no lumps or masses, no nipple discharge or rashes FEMALE GENITALIA:  Not done  RECTAL: not done  Musculoskeletal: Normal range of motion, no joint effusions. No gross deformities Neurological: he is alert and oriented to person, place, and time. No cranial nerve deficit.  Coordination, balance, strength, speech and gait are normal.  Skin: Skin is warm and dry. No rash noted. No erythema.  Psychiatric: Patient has a normal mood and affect. behavior is normal. Judgment and thought content  normal..   Recent Results (from the past 2160 hour(s))  Urine Culture     Status: None   Collection Time: 06/22/19  1:31 PM   Specimen: Urine   URINE  Result Value Ref Range   Urine Culture, Routine Final report    Organism ID, Bacteria Comment     Comment: Mixed urogenital flora 10,000-25,000 colony forming units per mL   Urinalysis, Routine w reflex microscopic     Status: None   Collection Time: 06/22/19  1:31 PM  Result Value Ref Range   Specific Gravity, UA 1.018 1.005 - 1.030   pH, UA 6.0 5.0 - 7.5   Color, UA Yellow Yellow   Appearance Ur Clear Clear   Leukocytes,UA Negative Negative   Protein,UA Negative Negative/Trace   Glucose, UA Negative Negative   Ketones, UA Negative Negative   RBC, UA Negative Negative   Bilirubin, UA Negative Negative   Urobilinogen, Ur 0.2 0.2 - 1.0 mg/dL   Nitrite, UA Negative Negative   Microscopic Examination Comment     Comment: Microscopic not indicated and not performed.     Fall Risk: Fall Risk  08/31/2019 08/29/2018 06/23/2018 03/08/2018 08/25/2017  Falls in the past year? 0 0 0 0 No  Number falls in past yr: 0 0 0 0 -  Injury with Fall? 0 0 0 0 -    Assessment & Plan  1. Well adult exam  - COMPLETE METABOLIC PANEL WITH GFR  2. Vitamin D deficiency  - VITAMIN D 25 Hydroxy (Vit-D Deficiency, Fractures)  3. Lipid screening  - Lipid panel  4. Elevated prolactin level  - Prolactin  5. Screening for deficiency anemia  - CBC with Differential/Platelet  6. Osteoarthritis of left knee, unspecified osteoarthritis type  Going for surgery 09/12/2019   7. Diabetes mellitus screening  - Hemoglobin A1c  -USPSTF grade A and B recommendations reviewed with patient; age-appropriate recommendations, preventive care, screening tests, etc discussed and encouraged; healthy living encouraged; see AVS for patient education given to patient -Discussed importance of 150 minutes of physical activity weekly, eat two servings of fish  weekly, eat one serving of tree nuts ( cashews, pistachios, pecans, almonds.Marland Kitchen) every other day, eat 6 servings of fruit/vegetables daily and drink plenty of water and avoid sweet beverages.

## 2019-09-01 LAB — HEMOGLOBIN A1C
Hgb A1c MFr Bld: 5.2 % of total Hgb (ref <5.7)
Mean Plasma Glucose: 103 (calc)
eAG (mmol/L): 5.7 (calc)

## 2019-09-01 LAB — LIPID PANEL
Cholesterol: 182 mg/dL (ref <200)
HDL: 59 mg/dL (ref >=50)
LDL Cholesterol (Calc): 107 mg/dL (calc) — ABNORMAL HIGH
Non-HDL Cholesterol (Calc): 123 mg/dL (calc) (ref <130)
Total CHOL/HDL Ratio: 3.1 (calc) (ref <5.0)
Triglycerides: 70 mg/dL (ref <150)

## 2019-09-01 LAB — COMPLETE METABOLIC PANEL WITH GFR
AG Ratio: 1.8 (calc) (ref 1.0–2.5)
ALT: 17 U/L (ref 6–29)
AST: 17 U/L (ref 10–35)
Albumin: 4.4 g/dL (ref 3.6–5.1)
Alkaline phosphatase (APISO): 92 U/L (ref 31–125)
BUN/Creatinine Ratio: 34 (calc) — ABNORMAL HIGH (ref 6–22)
BUN: 26 mg/dL — ABNORMAL HIGH (ref 7–25)
CO2: 28 mmol/L (ref 20–32)
Calcium: 9.5 mg/dL (ref 8.6–10.2)
Chloride: 104 mmol/L (ref 98–110)
Creat: 0.77 mg/dL (ref 0.50–1.10)
GFR, Est African American: 105 mL/min/{1.73_m2} (ref >=60)
GFR, Est Non African American: 91 mL/min/{1.73_m2} (ref >=60)
Globulin: 2.5 g/dL (calc) (ref 1.9–3.7)
Glucose, Bld: 84 mg/dL (ref 65–99)
Potassium: 4.4 mmol/L (ref 3.5–5.3)
Sodium: 138 mmol/L (ref 135–146)
Total Bilirubin: 0.6 mg/dL (ref 0.2–1.2)
Total Protein: 6.9 g/dL (ref 6.1–8.1)

## 2019-09-01 LAB — CBC WITH DIFFERENTIAL/PLATELET
Absolute Monocytes: 455 cells/uL (ref 200–950)
Basophils Absolute: 33 cells/uL (ref 0–200)
Basophils Relative: 0.5 %
Eosinophils Absolute: 271 cells/uL (ref 15–500)
Eosinophils Relative: 4.1 %
HCT: 43.4 % (ref 35.0–45.0)
Hemoglobin: 14.5 g/dL (ref 11.7–15.5)
Lymphs Abs: 1703 cells/uL (ref 850–3900)
MCH: 28.7 pg (ref 27.0–33.0)
MCHC: 33.4 g/dL (ref 32.0–36.0)
MCV: 85.9 fL (ref 80.0–100.0)
MPV: 10.3 fL (ref 7.5–12.5)
Monocytes Relative: 6.9 %
Neutro Abs: 4138 cells/uL (ref 1500–7800)
Neutrophils Relative %: 62.7 %
Platelets: 266 10*3/uL (ref 140–400)
RBC: 5.05 10*6/uL (ref 3.80–5.10)
RDW: 13.1 % (ref 11.0–15.0)
Total Lymphocyte: 25.8 %
WBC: 6.6 10*3/uL (ref 3.8–10.8)

## 2019-09-01 LAB — PROLACTIN: Prolactin: 9.5 ng/mL

## 2019-09-01 LAB — VITAMIN D 25 HYDROXY (VIT D DEFICIENCY, FRACTURES): Vit D, 25-Hydroxy: 30 ng/mL (ref 30–100)

## 2019-09-07 ENCOUNTER — Telehealth: Payer: BC Managed Care – PPO

## 2019-09-07 DIAGNOSIS — M25562 Pain in left knee: Secondary | ICD-10-CM | POA: Diagnosis not present

## 2019-09-07 DIAGNOSIS — G8929 Other chronic pain: Secondary | ICD-10-CM | POA: Insufficient documentation

## 2019-09-07 DIAGNOSIS — Z01818 Encounter for other preprocedural examination: Secondary | ICD-10-CM | POA: Diagnosis not present

## 2019-09-12 DIAGNOSIS — M1712 Unilateral primary osteoarthritis, left knee: Secondary | ICD-10-CM | POA: Diagnosis not present

## 2019-09-12 DIAGNOSIS — R262 Difficulty in walking, not elsewhere classified: Secondary | ICD-10-CM | POA: Diagnosis not present

## 2019-09-12 DIAGNOSIS — M6258 Muscle wasting and atrophy, not elsewhere classified, other site: Secondary | ICD-10-CM | POA: Diagnosis not present

## 2019-09-12 DIAGNOSIS — G8918 Other acute postprocedural pain: Secondary | ICD-10-CM | POA: Diagnosis not present

## 2019-09-12 DIAGNOSIS — Z4789 Encounter for other orthopedic aftercare: Secondary | ICD-10-CM | POA: Diagnosis not present

## 2019-09-12 DIAGNOSIS — M2550 Pain in unspecified joint: Secondary | ICD-10-CM | POA: Diagnosis not present

## 2019-09-12 DIAGNOSIS — M25562 Pain in left knee: Secondary | ICD-10-CM | POA: Diagnosis not present

## 2019-09-12 DIAGNOSIS — G8929 Other chronic pain: Secondary | ICD-10-CM | POA: Diagnosis not present

## 2019-09-15 DIAGNOSIS — R262 Difficulty in walking, not elsewhere classified: Secondary | ICD-10-CM | POA: Diagnosis not present

## 2019-09-15 DIAGNOSIS — Z4789 Encounter for other orthopedic aftercare: Secondary | ICD-10-CM | POA: Diagnosis not present

## 2019-09-15 DIAGNOSIS — M25562 Pain in left knee: Secondary | ICD-10-CM | POA: Diagnosis not present

## 2019-09-15 DIAGNOSIS — G8929 Other chronic pain: Secondary | ICD-10-CM | POA: Diagnosis not present

## 2019-09-21 DIAGNOSIS — M25562 Pain in left knee: Secondary | ICD-10-CM | POA: Diagnosis not present

## 2019-09-21 DIAGNOSIS — R262 Difficulty in walking, not elsewhere classified: Secondary | ICD-10-CM | POA: Diagnosis not present

## 2019-09-21 DIAGNOSIS — Z4789 Encounter for other orthopedic aftercare: Secondary | ICD-10-CM | POA: Diagnosis not present

## 2019-09-21 DIAGNOSIS — G8929 Other chronic pain: Secondary | ICD-10-CM | POA: Diagnosis not present

## 2019-09-26 DIAGNOSIS — R262 Difficulty in walking, not elsewhere classified: Secondary | ICD-10-CM | POA: Diagnosis not present

## 2019-09-26 DIAGNOSIS — Z4789 Encounter for other orthopedic aftercare: Secondary | ICD-10-CM | POA: Diagnosis not present

## 2019-09-26 DIAGNOSIS — M25562 Pain in left knee: Secondary | ICD-10-CM | POA: Diagnosis not present

## 2019-09-26 DIAGNOSIS — G8929 Other chronic pain: Secondary | ICD-10-CM | POA: Diagnosis not present

## 2019-09-27 ENCOUNTER — Telehealth: Payer: Self-pay | Admitting: Orthotics

## 2019-09-27 NOTE — Telephone Encounter (Signed)
Left voicemail for patient to return call to schedule to [facility]

## 2019-09-28 DIAGNOSIS — M1712 Unilateral primary osteoarthritis, left knee: Secondary | ICD-10-CM | POA: Diagnosis not present

## 2019-09-28 DIAGNOSIS — M25462 Effusion, left knee: Secondary | ICD-10-CM | POA: Diagnosis not present

## 2019-09-28 DIAGNOSIS — Z96652 Presence of left artificial knee joint: Secondary | ICD-10-CM | POA: Diagnosis not present

## 2019-10-02 DIAGNOSIS — Z4789 Encounter for other orthopedic aftercare: Secondary | ICD-10-CM | POA: Diagnosis not present

## 2019-10-02 DIAGNOSIS — R262 Difficulty in walking, not elsewhere classified: Secondary | ICD-10-CM | POA: Diagnosis not present

## 2019-10-02 DIAGNOSIS — M25562 Pain in left knee: Secondary | ICD-10-CM | POA: Diagnosis not present

## 2019-10-02 DIAGNOSIS — G8929 Other chronic pain: Secondary | ICD-10-CM | POA: Diagnosis not present

## 2019-10-05 DIAGNOSIS — R262 Difficulty in walking, not elsewhere classified: Secondary | ICD-10-CM | POA: Diagnosis not present

## 2019-10-05 DIAGNOSIS — M25562 Pain in left knee: Secondary | ICD-10-CM | POA: Diagnosis not present

## 2019-10-05 DIAGNOSIS — Z4789 Encounter for other orthopedic aftercare: Secondary | ICD-10-CM | POA: Diagnosis not present

## 2019-10-05 DIAGNOSIS — G8929 Other chronic pain: Secondary | ICD-10-CM | POA: Diagnosis not present

## 2019-10-12 ENCOUNTER — Encounter: Payer: Self-pay | Admitting: Family Medicine

## 2019-10-12 DIAGNOSIS — G8929 Other chronic pain: Secondary | ICD-10-CM | POA: Diagnosis not present

## 2019-10-12 DIAGNOSIS — R262 Difficulty in walking, not elsewhere classified: Secondary | ICD-10-CM | POA: Diagnosis not present

## 2019-10-12 DIAGNOSIS — Z4789 Encounter for other orthopedic aftercare: Secondary | ICD-10-CM | POA: Diagnosis not present

## 2019-10-12 DIAGNOSIS — M25562 Pain in left knee: Secondary | ICD-10-CM | POA: Diagnosis not present

## 2019-10-13 DIAGNOSIS — M6258 Muscle wasting and atrophy, not elsewhere classified, other site: Secondary | ICD-10-CM | POA: Diagnosis not present

## 2019-10-13 DIAGNOSIS — M2550 Pain in unspecified joint: Secondary | ICD-10-CM | POA: Diagnosis not present

## 2019-10-16 ENCOUNTER — Encounter: Payer: Self-pay | Admitting: Family Medicine

## 2019-10-17 DIAGNOSIS — R262 Difficulty in walking, not elsewhere classified: Secondary | ICD-10-CM | POA: Diagnosis not present

## 2019-10-17 DIAGNOSIS — Z4789 Encounter for other orthopedic aftercare: Secondary | ICD-10-CM | POA: Diagnosis not present

## 2019-10-17 DIAGNOSIS — M25562 Pain in left knee: Secondary | ICD-10-CM | POA: Diagnosis not present

## 2019-10-17 DIAGNOSIS — G8929 Other chronic pain: Secondary | ICD-10-CM | POA: Diagnosis not present

## 2019-10-23 DIAGNOSIS — D2262 Melanocytic nevi of left upper limb, including shoulder: Secondary | ICD-10-CM | POA: Diagnosis not present

## 2019-10-23 DIAGNOSIS — D2261 Melanocytic nevi of right upper limb, including shoulder: Secondary | ICD-10-CM | POA: Diagnosis not present

## 2019-10-23 DIAGNOSIS — L308 Other specified dermatitis: Secondary | ICD-10-CM | POA: Diagnosis not present

## 2019-10-23 DIAGNOSIS — L570 Actinic keratosis: Secondary | ICD-10-CM | POA: Diagnosis not present

## 2019-10-23 DIAGNOSIS — R262 Difficulty in walking, not elsewhere classified: Secondary | ICD-10-CM | POA: Diagnosis not present

## 2019-10-23 DIAGNOSIS — Z4789 Encounter for other orthopedic aftercare: Secondary | ICD-10-CM | POA: Diagnosis not present

## 2019-10-23 DIAGNOSIS — G8929 Other chronic pain: Secondary | ICD-10-CM | POA: Diagnosis not present

## 2019-10-23 DIAGNOSIS — L2089 Other atopic dermatitis: Secondary | ICD-10-CM | POA: Diagnosis not present

## 2019-10-23 DIAGNOSIS — M25562 Pain in left knee: Secondary | ICD-10-CM | POA: Diagnosis not present

## 2019-10-24 ENCOUNTER — Other Ambulatory Visit: Payer: Self-pay | Admitting: Family Medicine

## 2019-10-24 DIAGNOSIS — Z1239 Encounter for other screening for malignant neoplasm of breast: Secondary | ICD-10-CM

## 2019-10-26 DIAGNOSIS — Z4789 Encounter for other orthopedic aftercare: Secondary | ICD-10-CM | POA: Diagnosis not present

## 2019-10-26 DIAGNOSIS — M25562 Pain in left knee: Secondary | ICD-10-CM | POA: Diagnosis not present

## 2019-10-26 DIAGNOSIS — R262 Difficulty in walking, not elsewhere classified: Secondary | ICD-10-CM | POA: Diagnosis not present

## 2019-10-26 DIAGNOSIS — G8929 Other chronic pain: Secondary | ICD-10-CM | POA: Diagnosis not present

## 2019-10-30 DIAGNOSIS — Z4789 Encounter for other orthopedic aftercare: Secondary | ICD-10-CM | POA: Diagnosis not present

## 2019-10-30 DIAGNOSIS — M25562 Pain in left knee: Secondary | ICD-10-CM | POA: Diagnosis not present

## 2019-10-30 DIAGNOSIS — R262 Difficulty in walking, not elsewhere classified: Secondary | ICD-10-CM | POA: Diagnosis not present

## 2019-10-30 DIAGNOSIS — G8929 Other chronic pain: Secondary | ICD-10-CM | POA: Diagnosis not present

## 2019-11-02 DIAGNOSIS — M25562 Pain in left knee: Secondary | ICD-10-CM | POA: Diagnosis not present

## 2019-11-02 DIAGNOSIS — Z4789 Encounter for other orthopedic aftercare: Secondary | ICD-10-CM | POA: Diagnosis not present

## 2019-11-02 DIAGNOSIS — R262 Difficulty in walking, not elsewhere classified: Secondary | ICD-10-CM | POA: Diagnosis not present

## 2019-11-02 DIAGNOSIS — G8929 Other chronic pain: Secondary | ICD-10-CM | POA: Diagnosis not present

## 2019-11-02 DIAGNOSIS — Z96652 Presence of left artificial knee joint: Secondary | ICD-10-CM | POA: Diagnosis not present

## 2019-11-02 DIAGNOSIS — Z471 Aftercare following joint replacement surgery: Secondary | ICD-10-CM | POA: Diagnosis not present

## 2019-11-07 DIAGNOSIS — R262 Difficulty in walking, not elsewhere classified: Secondary | ICD-10-CM | POA: Diagnosis not present

## 2019-11-07 DIAGNOSIS — Z4789 Encounter for other orthopedic aftercare: Secondary | ICD-10-CM | POA: Diagnosis not present

## 2019-11-07 DIAGNOSIS — G8929 Other chronic pain: Secondary | ICD-10-CM | POA: Diagnosis not present

## 2019-11-07 DIAGNOSIS — M25562 Pain in left knee: Secondary | ICD-10-CM | POA: Diagnosis not present

## 2019-11-16 DIAGNOSIS — R262 Difficulty in walking, not elsewhere classified: Secondary | ICD-10-CM | POA: Diagnosis not present

## 2019-11-16 DIAGNOSIS — G8929 Other chronic pain: Secondary | ICD-10-CM | POA: Diagnosis not present

## 2019-11-16 DIAGNOSIS — M25562 Pain in left knee: Secondary | ICD-10-CM | POA: Diagnosis not present

## 2019-11-16 DIAGNOSIS — Z4789 Encounter for other orthopedic aftercare: Secondary | ICD-10-CM | POA: Diagnosis not present

## 2019-11-23 ENCOUNTER — Ambulatory Visit (INDEPENDENT_AMBULATORY_CARE_PROVIDER_SITE_OTHER): Payer: BC Managed Care – PPO | Admitting: Emergency Medicine

## 2019-11-23 ENCOUNTER — Other Ambulatory Visit: Payer: Self-pay

## 2019-11-23 DIAGNOSIS — G8929 Other chronic pain: Secondary | ICD-10-CM | POA: Diagnosis not present

## 2019-11-23 DIAGNOSIS — Z23 Encounter for immunization: Secondary | ICD-10-CM

## 2019-11-23 DIAGNOSIS — M25562 Pain in left knee: Secondary | ICD-10-CM | POA: Diagnosis not present

## 2019-11-23 DIAGNOSIS — R262 Difficulty in walking, not elsewhere classified: Secondary | ICD-10-CM | POA: Diagnosis not present

## 2019-11-23 DIAGNOSIS — Z4789 Encounter for other orthopedic aftercare: Secondary | ICD-10-CM | POA: Diagnosis not present

## 2019-12-07 DIAGNOSIS — Z471 Aftercare following joint replacement surgery: Secondary | ICD-10-CM | POA: Diagnosis not present

## 2019-12-07 DIAGNOSIS — Z4789 Encounter for other orthopedic aftercare: Secondary | ICD-10-CM | POA: Diagnosis not present

## 2019-12-07 DIAGNOSIS — G8929 Other chronic pain: Secondary | ICD-10-CM | POA: Diagnosis not present

## 2019-12-07 DIAGNOSIS — Z96652 Presence of left artificial knee joint: Secondary | ICD-10-CM | POA: Diagnosis not present

## 2019-12-07 DIAGNOSIS — R262 Difficulty in walking, not elsewhere classified: Secondary | ICD-10-CM | POA: Diagnosis not present

## 2019-12-07 DIAGNOSIS — M25562 Pain in left knee: Secondary | ICD-10-CM | POA: Diagnosis not present

## 2019-12-12 DIAGNOSIS — R262 Difficulty in walking, not elsewhere classified: Secondary | ICD-10-CM | POA: Diagnosis not present

## 2019-12-12 DIAGNOSIS — G8929 Other chronic pain: Secondary | ICD-10-CM | POA: Diagnosis not present

## 2019-12-12 DIAGNOSIS — M25562 Pain in left knee: Secondary | ICD-10-CM | POA: Diagnosis not present

## 2019-12-12 DIAGNOSIS — Z4789 Encounter for other orthopedic aftercare: Secondary | ICD-10-CM | POA: Diagnosis not present

## 2019-12-22 DIAGNOSIS — M25562 Pain in left knee: Secondary | ICD-10-CM | POA: Diagnosis not present

## 2019-12-22 DIAGNOSIS — G8929 Other chronic pain: Secondary | ICD-10-CM | POA: Diagnosis not present

## 2019-12-22 DIAGNOSIS — Z4789 Encounter for other orthopedic aftercare: Secondary | ICD-10-CM | POA: Diagnosis not present

## 2019-12-22 DIAGNOSIS — R262 Difficulty in walking, not elsewhere classified: Secondary | ICD-10-CM | POA: Diagnosis not present

## 2019-12-27 DIAGNOSIS — G8929 Other chronic pain: Secondary | ICD-10-CM | POA: Diagnosis not present

## 2019-12-27 DIAGNOSIS — M25562 Pain in left knee: Secondary | ICD-10-CM | POA: Diagnosis not present

## 2019-12-27 DIAGNOSIS — Z4789 Encounter for other orthopedic aftercare: Secondary | ICD-10-CM | POA: Diagnosis not present

## 2019-12-27 DIAGNOSIS — R262 Difficulty in walking, not elsewhere classified: Secondary | ICD-10-CM | POA: Diagnosis not present

## 2020-01-01 DIAGNOSIS — N912 Amenorrhea, unspecified: Secondary | ICD-10-CM | POA: Diagnosis not present

## 2020-01-01 DIAGNOSIS — Z01419 Encounter for gynecological examination (general) (routine) without abnormal findings: Secondary | ICD-10-CM | POA: Diagnosis not present

## 2020-01-01 DIAGNOSIS — Z1231 Encounter for screening mammogram for malignant neoplasm of breast: Secondary | ICD-10-CM | POA: Diagnosis not present

## 2020-01-04 DIAGNOSIS — Z4789 Encounter for other orthopedic aftercare: Secondary | ICD-10-CM | POA: Diagnosis not present

## 2020-01-04 DIAGNOSIS — G8929 Other chronic pain: Secondary | ICD-10-CM | POA: Diagnosis not present

## 2020-01-04 DIAGNOSIS — R262 Difficulty in walking, not elsewhere classified: Secondary | ICD-10-CM | POA: Diagnosis not present

## 2020-01-04 DIAGNOSIS — M25562 Pain in left knee: Secondary | ICD-10-CM | POA: Diagnosis not present

## 2020-01-08 ENCOUNTER — Encounter: Payer: Self-pay | Admitting: Family Medicine

## 2020-01-17 DIAGNOSIS — Z4789 Encounter for other orthopedic aftercare: Secondary | ICD-10-CM | POA: Diagnosis not present

## 2020-01-17 DIAGNOSIS — R262 Difficulty in walking, not elsewhere classified: Secondary | ICD-10-CM | POA: Diagnosis not present

## 2020-01-17 DIAGNOSIS — M25562 Pain in left knee: Secondary | ICD-10-CM | POA: Diagnosis not present

## 2020-01-17 DIAGNOSIS — G8929 Other chronic pain: Secondary | ICD-10-CM | POA: Diagnosis not present

## 2020-01-24 DIAGNOSIS — G8929 Other chronic pain: Secondary | ICD-10-CM | POA: Diagnosis not present

## 2020-01-24 DIAGNOSIS — Z4789 Encounter for other orthopedic aftercare: Secondary | ICD-10-CM | POA: Diagnosis not present

## 2020-01-24 DIAGNOSIS — M76899 Other specified enthesopathies of unspecified lower limb, excluding foot: Secondary | ICD-10-CM | POA: Diagnosis not present

## 2020-01-24 DIAGNOSIS — M25562 Pain in left knee: Secondary | ICD-10-CM | POA: Diagnosis not present

## 2020-01-24 DIAGNOSIS — Z96652 Presence of left artificial knee joint: Secondary | ICD-10-CM | POA: Diagnosis not present

## 2020-01-24 DIAGNOSIS — M7122 Synovial cyst of popliteal space [Baker], left knee: Secondary | ICD-10-CM | POA: Diagnosis not present

## 2020-01-24 DIAGNOSIS — R262 Difficulty in walking, not elsewhere classified: Secondary | ICD-10-CM | POA: Diagnosis not present

## 2020-02-02 DIAGNOSIS — Z4789 Encounter for other orthopedic aftercare: Secondary | ICD-10-CM | POA: Diagnosis not present

## 2020-02-02 DIAGNOSIS — G8929 Other chronic pain: Secondary | ICD-10-CM | POA: Diagnosis not present

## 2020-02-02 DIAGNOSIS — R262 Difficulty in walking, not elsewhere classified: Secondary | ICD-10-CM | POA: Diagnosis not present

## 2020-02-02 DIAGNOSIS — M25562 Pain in left knee: Secondary | ICD-10-CM | POA: Diagnosis not present

## 2020-02-07 DIAGNOSIS — Z4789 Encounter for other orthopedic aftercare: Secondary | ICD-10-CM | POA: Diagnosis not present

## 2020-02-07 DIAGNOSIS — R262 Difficulty in walking, not elsewhere classified: Secondary | ICD-10-CM | POA: Diagnosis not present

## 2020-02-07 DIAGNOSIS — G8929 Other chronic pain: Secondary | ICD-10-CM | POA: Diagnosis not present

## 2020-02-07 DIAGNOSIS — M25562 Pain in left knee: Secondary | ICD-10-CM | POA: Diagnosis not present

## 2020-02-15 DIAGNOSIS — R262 Difficulty in walking, not elsewhere classified: Secondary | ICD-10-CM | POA: Diagnosis not present

## 2020-02-15 DIAGNOSIS — M25562 Pain in left knee: Secondary | ICD-10-CM | POA: Diagnosis not present

## 2020-02-15 DIAGNOSIS — G8929 Other chronic pain: Secondary | ICD-10-CM | POA: Diagnosis not present

## 2020-02-15 DIAGNOSIS — Z4789 Encounter for other orthopedic aftercare: Secondary | ICD-10-CM | POA: Diagnosis not present

## 2020-02-20 DIAGNOSIS — Z4789 Encounter for other orthopedic aftercare: Secondary | ICD-10-CM | POA: Diagnosis not present

## 2020-02-20 DIAGNOSIS — R262 Difficulty in walking, not elsewhere classified: Secondary | ICD-10-CM | POA: Diagnosis not present

## 2020-02-20 DIAGNOSIS — G8929 Other chronic pain: Secondary | ICD-10-CM | POA: Diagnosis not present

## 2020-02-20 DIAGNOSIS — Z96652 Presence of left artificial knee joint: Secondary | ICD-10-CM | POA: Diagnosis not present

## 2020-02-20 DIAGNOSIS — M25562 Pain in left knee: Secondary | ICD-10-CM | POA: Diagnosis not present

## 2020-03-14 DIAGNOSIS — R262 Difficulty in walking, not elsewhere classified: Secondary | ICD-10-CM | POA: Diagnosis not present

## 2020-03-14 DIAGNOSIS — G8929 Other chronic pain: Secondary | ICD-10-CM | POA: Diagnosis not present

## 2020-03-14 DIAGNOSIS — Z96652 Presence of left artificial knee joint: Secondary | ICD-10-CM | POA: Diagnosis not present

## 2020-03-14 DIAGNOSIS — Z4789 Encounter for other orthopedic aftercare: Secondary | ICD-10-CM | POA: Diagnosis not present

## 2020-03-14 DIAGNOSIS — M25562 Pain in left knee: Secondary | ICD-10-CM | POA: Diagnosis not present

## 2020-04-02 DIAGNOSIS — M25562 Pain in left knee: Secondary | ICD-10-CM | POA: Diagnosis not present

## 2020-04-02 DIAGNOSIS — R262 Difficulty in walking, not elsewhere classified: Secondary | ICD-10-CM | POA: Diagnosis not present

## 2020-04-02 DIAGNOSIS — Z4789 Encounter for other orthopedic aftercare: Secondary | ICD-10-CM | POA: Diagnosis not present

## 2020-04-02 DIAGNOSIS — G8929 Other chronic pain: Secondary | ICD-10-CM | POA: Diagnosis not present

## 2020-04-11 DIAGNOSIS — R262 Difficulty in walking, not elsewhere classified: Secondary | ICD-10-CM | POA: Diagnosis not present

## 2020-04-11 DIAGNOSIS — Z4789 Encounter for other orthopedic aftercare: Secondary | ICD-10-CM | POA: Diagnosis not present

## 2020-04-11 DIAGNOSIS — M25562 Pain in left knee: Secondary | ICD-10-CM | POA: Diagnosis not present

## 2020-04-11 DIAGNOSIS — G8929 Other chronic pain: Secondary | ICD-10-CM | POA: Diagnosis not present

## 2020-04-30 DIAGNOSIS — G8929 Other chronic pain: Secondary | ICD-10-CM | POA: Diagnosis not present

## 2020-04-30 DIAGNOSIS — Z4789 Encounter for other orthopedic aftercare: Secondary | ICD-10-CM | POA: Diagnosis not present

## 2020-04-30 DIAGNOSIS — R262 Difficulty in walking, not elsewhere classified: Secondary | ICD-10-CM | POA: Diagnosis not present

## 2020-04-30 DIAGNOSIS — M25562 Pain in left knee: Secondary | ICD-10-CM | POA: Diagnosis not present

## 2020-05-23 DIAGNOSIS — G8929 Other chronic pain: Secondary | ICD-10-CM | POA: Diagnosis not present

## 2020-05-23 DIAGNOSIS — Z4789 Encounter for other orthopedic aftercare: Secondary | ICD-10-CM | POA: Diagnosis not present

## 2020-05-23 DIAGNOSIS — R262 Difficulty in walking, not elsewhere classified: Secondary | ICD-10-CM | POA: Diagnosis not present

## 2020-05-23 DIAGNOSIS — M25562 Pain in left knee: Secondary | ICD-10-CM | POA: Diagnosis not present

## 2020-08-30 NOTE — Patient Instructions (Signed)
Preventive Care 68-50 Years Old, Female Preventive care refers to lifestyle choices and visits with your health care provider that can promote health and wellness. This includes: A yearly physical exam. This is also called an annual wellness visit. Regular dental and eye exams. Immunizations. Screening for certain conditions. Healthy lifestyle choices, such as: Eating a healthy diet. Getting regular exercise. Not using drugs or products that contain nicotine and tobacco. Limiting alcohol use. What can I expect for my preventive care visit? Physical exam Your health care provider will check your: Height and weight. These may be used to calculate your BMI (body mass index). BMI is a measurement that tells if you are at a healthy weight. Heart rate and blood pressure. Body temperature. Skin for abnormal spots. Counseling Your health care provider may ask you questions about your: Past medical problems. Family's medical history. Alcohol, tobacco, and drug use. Emotional well-being. Home life and relationship well-being. Sexual activity. Diet, exercise, and sleep habits. Work and work Statistician. Access to firearms. Method of birth control. Menstrual cycle. Pregnancy history. What immunizations do I need?  Vaccines are usually given at various ages, according to a schedule. Your health care provider will recommend vaccines for you based on your age, medicalhistory, and lifestyle or other factors, such as travel or where you work. What tests do I need? Blood tests Lipid and cholesterol levels. These may be checked every 5 years, or more often if you are over 37 years old. Hepatitis C test. Hepatitis B test. Screening Lung cancer screening. You may have this screening every year starting at age 30 if you have a 30-pack-year history of smoking and currently smoke or have quit within the past 15 years. Colorectal cancer screening. All adults should have this screening starting at  age 23 and continuing until age 3. Your health care provider may recommend screening at age 88 if you are at increased risk. You will have tests every 1-10 years, depending on your results and the type of screening test. Diabetes screening. This is done by checking your blood sugar (glucose) after you have not eaten for a while (fasting). You may have this done every 1-3 years. Mammogram. This may be done every 1-2 years. Talk with your health care provider about when you should start having regular mammograms. This may depend on whether you have a family history of breast cancer. BRCA-related cancer screening. This may be done if you have a family history of breast, ovarian, tubal, or peritoneal cancers. Pelvic exam and Pap test. This may be done every 3 years starting at age 79. Starting at age 54, this may be done every 5 years if you have a Pap test in combination with an HPV test. Other tests STD (sexually transmitted disease) testing, if you are at risk. Bone density scan. This is done to screen for osteoporosis. You may have this scan if you are at high risk for osteoporosis. Talk with your health care provider about your test results, treatment options,and if necessary, the need for more tests. Follow these instructions at home: Eating and drinking  Eat a diet that includes fresh fruits and vegetables, whole grains, lean protein, and low-fat dairy products. Take vitamin and mineral supplements as recommended by your health care provider. Do not drink alcohol if: Your health care provider tells you not to drink. You are pregnant, may be pregnant, or are planning to become pregnant. If you drink alcohol: Limit how much you have to 0-1 drink a day. Be aware  of how much alcohol is in your drink. In the [place] one drink equals one 12 oz bottle of beer (355 mL), one 5 oz glass of wine (148 mL), or one 1 oz glass of hard liquor (44 mL).  Lifestyle Take daily care of your teeth and  gums. Brush your teeth every morning and night with fluoride toothpaste. Floss one time each day. Stay active. Exercise for at least 30 minutes 5 or more days each week. Do not use any products that contain nicotine or tobacco, such as cigarettes, e-cigarettes, and chewing tobacco. If you need help quitting, ask your health care provider. Do not use drugs. If you are sexually active, practice safe sex. Use a condom or other form of protection to prevent STIs (sexually transmitted infections). If you do not wish to become pregnant, use a form of birth control. If you plan to become pregnant, see your health care provider for a prepregnancy visit. If told by your health care provider, take low-dose aspirin daily starting at age 29. Find healthy ways to cope with stress, such as: Meditation, yoga, or listening to music. Journaling. Talking to a trusted person. Spending time with friends and family. Safety Always wear your seat belt while driving or riding in a vehicle. Do not drive: If you have been drinking alcohol. Do not ride with someone who has been drinking. When you are tired or distracted. While texting. Wear a helmet and other protective equipment during sports activities. If you have firearms in your house, make sure you follow all gun safety procedures. What's next? Visit your health care provider once a year for an annual wellness visit. Ask your health care provider how often you should have your eyes and teeth checked. Stay up to date on all vaccines. This information is not intended to replace advice given to you by your health care provider. Make sure you discuss any questions you have with your healthcare provider. Document Revised: 10/10/2019 Document Reviewed: 09/16/2017 Elsevier Patient Education  2022 [employer].

## 2020-08-30 NOTE — Progress Notes (Signed)
Name: [name]   MRN: [id number]    DOB: [birth year]   Date:09/02/2020       Progress Note  Subjective  Chief Complaint  Annual Exam  HPI  Patient presents for annual CPE.  Diet: balanced diet includes fruits, vegetables and whole grains Exercise: 4-5 times a week, walking, playing tennis  Vonore Visit from 09/02/2020 in [facility]  AUDIT-C Score 1      Depression: Phq 9 is  negative Depression screen [facility] 2/9 09/02/2020 08/31/2019 08/29/2018 06/23/2018 03/08/2018  Decreased Interest 0 0 0 0 0  Down, Depressed, Hopeless 0 0 0 0 0  PHQ - 2 Score 0 0 0 0 0  Altered sleeping 0 0 0 0 -  Tired, decreased energy 0 0 0 0 -  Change in appetite 0 0 0 0 -  Feeling bad or failure about yourself  0 0 0 0 -  Trouble concentrating 0 0 0 0 -  Moving slowly or fidgety/restless 0 0 0 0 -  Suicidal thoughts 0 0 0 0 -  PHQ-9 Score 0 0 0 0 -  Difficult doing work/chores - - - Not difficult at all -   Hypertension: BP Readings from Last 3 Encounters:  09/02/20 120/76  08/31/19 120/80  06/22/19 122/66   Obesity: Wt Readings from Last 3 Encounters:  09/02/20 172 lb (78 kg)  08/31/19 177 lb 11.2 oz (80.6 kg)  06/22/19 176 lb (79.8 kg)   BMI Readings from Last 3 Encounters:  09/02/20 31.46 kg/m  08/31/19 32.50 kg/m  06/22/19 33.25 kg/m     Vaccines:   Shingrix: not interested Pneumonia: educated and discussed with patient. Flu: educated and discussed with patient.  Hep C Screening: 08/29/18 STD testing and prevention (HIV/chl/gon/syphilis): Not interested Intimate partner violence:negative Sexual History : one partner, married Menstrual History/LMP/Abnormal Bleeding: LMP 04/13/20, followed by OBGYN Incontinence Symptoms: no problems  Breast cancer:  - Last Mammogram: Ordered 10/24/19 - BRCA gene screening: N/A  Osteoporosis: Discussed high calcium and vitamin D supplementation, weight bearing exercises  Cervical cancer screening:  12/04/19  Skin cancer: Discussed monitoring for atypical lesions  Colorectal cancer: will order colonoscopy    Lung cancer: Low Dose CT Chest recommended if Age 50-80 years, 20 pack-year currently smoking OR have quit w/in 15years. Patient does not qualify.   ECG: 03/08/18  Advanced Care Planning: A voluntary discussion about advance care planning including the explanation and discussion of advance directives.  Discussed health care proxy and Living will, and the patient was able to identify a health care proxy as husband.    Lipids: Lab Results  Component Value Date   CHOL 182 08/31/2019   CHOL 200 (H) 08/29/2018   CHOL 176 08/25/2017   Lab Results  Component Value Date   HDL 59 08/31/2019   HDL 58 08/29/2018   HDL 56 08/25/2017   Lab Results  Component Value Date   LDLCALC 107 (H) 08/31/2019   LDLCALC 122 (H) 08/29/2018   LDLCALC 104 (H) 08/25/2017   Lab Results  Component Value Date   TRIG 70 08/31/2019   TRIG 97 08/29/2018   TRIG 69 08/25/2017   Lab Results  Component Value Date   CHOLHDL 3.1 08/31/2019   CHOLHDL 3.4 08/29/2018   CHOLHDL 3.1 08/25/2017   No results found for: LDLDIRECT  Glucose: Glucose, Bld  Date Value Ref Range Status  08/31/2019 84 65 - 99 mg/dL Final    Comment:    .  Fasting reference interval .   08/29/2018 88 65 - 99 mg/dL Final    Comment:    .            Fasting reference interval .   03/08/2018 87 65 - 99 mg/dL Final    Comment:    .            Fasting reference interval .     Patient Active Problem List   Diagnosis Date Noted   Status post left partial knee replacement 12/07/2019   Menopausal symptom 06/17/2017   Irregular periods 06/17/2017   Dysmenorrhea 06/17/2017   Drug allergy 06/17/2017   Vitamin D deficiency 12/04/2016   Plantar fasciitis, left 05/19/2016   Cervical high risk HPV (human papillomavirus) test positive 11/04/2015   Allergic rhinitis 10/10/2014   Back pain, chronic 10/10/2014    Clinical depression 10/10/2014   Dermatitis, eczematoid 10/10/2014   Insomnia 37/85/8850   Umbilical hernia without obstruction and without gangrene 10/10/2014   H/O allergy to multiple drugs 02/22/2012   Blood type, Rh negative 02/22/2012   Previous cesarean delivery x 4 02/22/2012   Personal history of kidney stones 02/22/2012    Past Surgical History:  Procedure Laterality Date   CESAREAN SECTION  2003,2005,2008,2010   FOOT SURGERY     neuroma left foot   meniscal repair Left 11/2018   tummy tuck  27/74/1287   UMBILICAL HERNIA REPAIR  05/31/2014   VENTRAL HERNIA REPAIR  05/31/2014   WISDOM TOOTH EXTRACTION      Family History  Problem Relation Age of Onset   Cancer Mother 56       breast cancer   Osteoarthritis Mother    Broken bones Mother        Hip x2   Varicose Veins Mother    Hyperlipidemia Father    Hypertension Father    Heart attack Father    Hypertension Paternal Grandmother    Congenital heart disease Paternal Grandmother    Stroke Paternal Grandfather    Kidney disease Daughter    Hypercholesterolemia Brother    Colon cancer Maternal Grandmother    Heart disease Maternal Grandfather    Hypertension Sister    Migraines Sister    Thyroid disease Sister    GI problems Sister        Extra kink in her large intestine   Hypercholesterolemia Brother     Social History   Socioeconomic History   Marital status: Married    Spouse name: Not on file   Number of children: 4   Years of education: Not on file   Highest education level: Doctorate  Occupational History   Occupation: Professor    Fish farm manager: [employer]    Comment: [place]  Tobacco Use   Smoking status: Never   Smokeless tobacco: Never  Scientific laboratory technician Use: Never used  Substance and Sexual Activity   Alcohol use: No   Drug use: No   Sexual activity: Yes    Partners: Male    Birth control/protection: Rhythm    Comment: married   Other Topics Concern   Not on file  Social History  Narrative   Married, she has 4 daughters    She works as Development worker, international aid at [employer], and is on Optician, dispensing role and teaching   Social Determinants of Radio broadcast assistant Strain: Low Risk    Difficulty of Paying Living Expenses: Not hard at all  Food Insecurity: No Food Insecurity   Worried About  Running Out of Food in the Last Year: Never true   Ran Out of Food in the Last Year: Never true  Transportation Needs: No Transportation Needs   Lack of Transportation (Medical): No   Lack of Transportation (Non-Medical): No  Physical Activity: Sufficiently Active   Days of Exercise per Week: 4 days   Minutes of Exercise per Session: 50 min  Stress: No Stress Concern Present   Feeling of Stress : Only a little  Social Connections: Moderately Integrated   Frequency of Communication with Friends and Family: More than three times a week   Frequency of Social Gatherings with Friends and Family: Once a week   Attends Religious Services: Never   Marine scientist or Organizations: Yes   Attends Music therapist: More than 4 times per year   Marital Status: Married  Human resources officer Violence: Not At Risk   Fear of Current or Ex-Partner: No   Emotionally Abused: No   Physically Abused: No   Sexually Abused: No     Current Outpatient Medications:    clobetasol ointment (TEMOVATE) 3.55 %, APPLY 1 APPLICATION TO SKIN TWICE DAILY, Disp: , Rfl: 4  Allergies  Allergen Reactions   Erythromycin Other (See Comments)    Severe abdominal pain   Penicillins Hives     ROS  Constitutional: Negative for fever or weight change.  Respiratory: Negative for cough and shortness of breath.   Cardiovascular: Negative for chest pain, positive palpitations.  Gastrointestinal: Negative for abdominal pain, no bowel changes.  Musculoskeletal: Negative for gait problem or joint swelling.  Skin: Negative for rash.  Neurological: Negative for dizziness or headache.  No other specific  complaints in a complete review of systems (except as listed in HPI above).   Objective  Vitals:   09/02/20 0817  BP: 120/76  Pulse: 87  Resp: 16  Temp: 98.1 F (36.7 C)  SpO2: 98%  Weight: 172 lb (78 kg)  Height: '5\' 2"'  (1.575 m)    Body mass index is 31.46 kg/m.  Physical Exam  Constitutional: Patient appears well-developed and well-nourished. No distress.  HENT: Head: Normocephalic and atraumatic. Ears: B TMs ok, no erythema or effusion; Nose: Nose normal. Mouth/Throat: Oropharynx is clear and moist. No oropharyngeal exudate.  Eyes: Conjunctivae and EOM are normal. Pupils are equal, round, and reactive to light. No scleral icterus.  Neck: Normal range of motion. Neck supple. No JVD present. No thyromegaly present.  Cardiovascular: Normal rate, regular rhythm and normal heart sounds.  No murmur heard. No BLE edema. Pulmonary/Chest: Effort normal and breath sounds normal. No respiratory distress. Abdominal: Soft. Bowel sounds are normal, no distension. There is no tenderness. no masses Breast: not done FEMALE GENITALIA:  Not done RECTAL: not done Musculoskeletal: Normal range of motion, no joint effusions. No gross deformities Neurological: he is alert and oriented to person, place, and time. No cranial nerve deficit. Coordination, balance, strength, speech and gait are normal.  Skin: Skin is warm and dry. No rash noted. No erythema.  Psychiatric: Patient has a normal mood and affect. behavior is normal. Judgment and thought content normal.   No results found for this or any previous visit (from the past 2160 hour(s)).   Fall Risk: Fall Risk  09/02/2020 08/31/2019 08/29/2018 06/23/2018 03/08/2018  Falls in the past year? 0 0 0 0 0  Number falls in past yr: 0 0 0 0 0  Injury with Fall? 0 0 0 0 0     Functional Status  Survey: Is the patient deaf or have difficulty hearing?: No Does the patient have difficulty seeing, even when wearing glasses/contacts?: No Does the patient  have difficulty concentrating, remembering, or making decisions?: No Does the patient have difficulty walking or climbing stairs?: No Does the patient have difficulty dressing or bathing?: No Does the patient have difficulty doing errands alone such as visiting a doctor's office or shopping?: No   Assessment & Plan   1. Well adult exam  - Ambulatory referral to Gastroenterology - COMPLETE METABOLIC PANEL WITH GFR  2. Lipid screening  - Lipid panel  3. Vitamin D deficiency  - VITAMIN D 25 Hydroxy (Vit-D Deficiency, Fractures)  4. Screening for deficiency anemia  - CBC with Differential/Platelet  5. Diabetes mellitus screening  - Hemoglobin A1c   -USPSTF grade A and B recommendations reviewed with patient; age-appropriate recommendations, preventive care, screening tests, etc discussed and encouraged; healthy living encouraged; see AVS for patient education given to patient -Discussed importance of 150 minutes of physical activity weekly, eat two servings of fish weekly, eat one serving of tree nuts ( cashews, pistachios, pecans, almonds.Marland Kitchen) every other day, eat 6 servings of fruit/vegetables daily and drink plenty of water and avoid sweet beverages.

## 2020-09-02 ENCOUNTER — Other Ambulatory Visit: Payer: Self-pay

## 2020-09-02 ENCOUNTER — Encounter: Payer: Self-pay | Admitting: Family Medicine

## 2020-09-02 ENCOUNTER — Ambulatory Visit (INDEPENDENT_AMBULATORY_CARE_PROVIDER_SITE_OTHER): Payer: BC Managed Care – PPO | Admitting: Family Medicine

## 2020-09-02 VITALS — BP 120/76 | HR 87 | Temp 98.1°F | Resp 16 | Ht 62.0 in | Wt 172.0 lb

## 2020-09-02 DIAGNOSIS — Z131 Encounter for screening for diabetes mellitus: Secondary | ICD-10-CM | POA: Diagnosis not present

## 2020-09-02 DIAGNOSIS — Z0000 Encounter for general adult medical examination without abnormal findings: Secondary | ICD-10-CM | POA: Diagnosis not present

## 2020-09-02 DIAGNOSIS — E559 Vitamin D deficiency, unspecified: Secondary | ICD-10-CM

## 2020-09-02 DIAGNOSIS — Z13220 Encounter for screening for lipoid disorders: Secondary | ICD-10-CM

## 2020-09-02 DIAGNOSIS — Z130 Encounter for screening for diseases of the blood and blood-forming organs and certain disorders involving the immune mechanism: Secondary | ICD-10-CM

## 2020-09-03 LAB — COMPLETE METABOLIC PANEL WITH GFR
AG Ratio: 2 (calc) (ref 1.0–2.5)
ALT: 17 U/L (ref 6–29)
AST: 18 U/L (ref 10–35)
Albumin: 4.6 g/dL (ref 3.6–5.1)
Alkaline phosphatase (APISO): 108 U/L (ref 37–153)
BUN: 23 mg/dL (ref 7–25)
CO2: 26 mmol/L (ref 20–32)
Calcium: 9.2 mg/dL (ref 8.6–10.4)
Chloride: 107 mmol/L (ref 98–110)
Creat: 0.7 mg/dL (ref 0.50–1.03)
Globulin: 2.3 g/dL (calc) (ref 1.9–3.7)
Glucose, Bld: 97 mg/dL (ref 65–99)
Potassium: 4.2 mmol/L (ref 3.5–5.3)
Sodium: 139 mmol/L (ref 135–146)
Total Bilirubin: 0.6 mg/dL (ref 0.2–1.2)
Total Protein: 6.9 g/dL (ref 6.1–8.1)
eGFR: 105 mL/min/{1.73_m2} (ref >=60)

## 2020-09-03 LAB — HEMOGLOBIN A1C
Hgb A1c MFr Bld: 5.1 % of total Hgb (ref <5.7)
Mean Plasma Glucose: 100 mg/dL
eAG (mmol/L): 5.5 mmol/L

## 2020-09-03 LAB — CBC WITH DIFFERENTIAL/PLATELET
Absolute Monocytes: 347 cells/uL (ref 200–950)
Basophils Absolute: 37 cells/uL (ref 0–200)
Basophils Relative: 0.6 %
Eosinophils Absolute: 267 cells/uL (ref 15–500)
Eosinophils Relative: 4.3 %
HCT: 41.4 % (ref 35.0–45.0)
Hemoglobin: 14.1 g/dL (ref 11.7–15.5)
Lymphs Abs: 1624 cells/uL (ref 850–3900)
MCH: 29.7 pg (ref 27.0–33.0)
MCHC: 34.1 g/dL (ref 32.0–36.0)
MCV: 87.2 fL (ref 80.0–100.0)
MPV: 10.4 fL (ref 7.5–12.5)
Monocytes Relative: 5.6 %
Neutro Abs: 3925 cells/uL (ref 1500–7800)
Neutrophils Relative %: 63.3 %
Platelets: 255 10*3/uL (ref 140–400)
RBC: 4.75 10*6/uL (ref 3.80–5.10)
RDW: 12.8 % (ref 11.0–15.0)
Total Lymphocyte: 26.2 %
WBC: 6.2 10*3/uL (ref 3.8–10.8)

## 2020-09-03 LAB — LIPID PANEL
Cholesterol: 178 mg/dL (ref <200)
HDL: 57 mg/dL (ref >=50)
LDL Cholesterol (Calc): 105 mg/dL (calc) — ABNORMAL HIGH
Non-HDL Cholesterol (Calc): 121 mg/dL (calc) (ref <130)
Total CHOL/HDL Ratio: 3.1 (calc) (ref <5.0)
Triglycerides: 74 mg/dL (ref <150)

## 2020-09-03 LAB — VITAMIN D 25 HYDROXY (VIT D DEFICIENCY, FRACTURES): Vit D, 25-Hydroxy: 51 ng/mL (ref 30–100)

## 2020-09-11 ENCOUNTER — Telehealth: Payer: Self-pay

## 2020-09-11 NOTE — Telephone Encounter (Signed)
CALLED NO ANSWER LEFT VOICEMAIL FOR A CALL BACK 

## 2020-09-12 DIAGNOSIS — Z471 Aftercare following joint replacement surgery: Secondary | ICD-10-CM | POA: Diagnosis not present

## 2020-09-12 DIAGNOSIS — Z96652 Presence of left artificial knee joint: Secondary | ICD-10-CM | POA: Diagnosis not present

## 2020-09-23 ENCOUNTER — Other Ambulatory Visit: Payer: Self-pay

## 2020-09-23 ENCOUNTER — Encounter: Payer: Self-pay | Admitting: Medical

## 2020-09-23 ENCOUNTER — Ambulatory Visit: Payer: BC Managed Care – PPO | Admitting: Medical

## 2020-09-23 VITALS — BP 125/70 | HR 73 | Temp 98.7°F | Resp 16 | Ht 62.0 in | Wt 172.0 lb

## 2020-09-23 DIAGNOSIS — L309 Dermatitis, unspecified: Secondary | ICD-10-CM

## 2020-09-23 DIAGNOSIS — H1033 Unspecified acute conjunctivitis, bilateral: Secondary | ICD-10-CM

## 2020-09-23 DIAGNOSIS — H5789 Other specified disorders of eye and adnexa: Secondary | ICD-10-CM

## 2020-09-23 LAB — POC COVID19 BINAXNOW: SARS Coronavirus 2 Ag: NEGATIVE

## 2020-09-23 MED ORDER — PREDNISONE 10 MG (21) PO TBPK: ORAL_TABLET | ORAL | 0 refills | Status: DC

## 2020-09-23 MED ORDER — CIPROFLOXACIN HCL 0.3 % OP SOLN: 1.0000 [drp] | OPHTHALMIC | 0 refills | Status: DC

## 2020-09-23 NOTE — Patient Instructions (Signed)
Prednisone Delayed-Release Tablets What is this medication? PREDNISONE (PRED ni sone) treats many conditions such as asthma, allergic reactions, arthritis, inflammatory bowel diseases, adrenal, and blood or bone marrow disorders. It works by decreasing inflammation, slowing down an overactive immune system, or replacing cortisol normally made in the body. Cortisol is a hormone that plays an important role in how the body responds to stress, illness, and injury. It belongs to a group of medications called steroids. This medicine may be used for other purposes; ask your health care provider or pharmacist if you have questions. COMMON BRAND NAME(S): RAYOS What should I tell my care team before I take this medication? They need to know if you have any of these conditions: Cushing's syndrome Diabetes Glaucoma Heart disease High blood pressure Infection (especially a virus infection such as chickenpox, cold sores, or herpes) Kidney disease Liver disease Mental illness Myasthenia gravis Osteoporosis Seizures Stomach or intestine problems Thyroid disease An unusual or allergic reaction to lactose, prednisone, other medications, foods, dyes, or preservatives Pregnant or trying to get pregnant Breast-feeding How should I use this medication? Take this medication by mouth with a glass of water. Follow the directions on the prescription label. Take this medication with food. Do not cut, crush or chew this medication. Do not suddenly stop taking your medication because you may develop a severe reaction. If your care team wants you to stop the medication, the dose may be slowly lowered over time to avoid any side effects. Talk to your care team about the use of this medication in children. Special care may be needed. Overdosage: If you think you have taken too much of this medicine contact a poison control center or emergency room at once. NOTE: This medicine is only for you. Do not share this medicine  with others. What if I miss a dose? If you miss a dose, take it as soon as you can. If it is almost time for your next dose, talk to your care team. You may need to miss a dose or take an extra dose. Do not take double or extra doses without advice. What may interact with this medication? Do not take this medication with any of the following: Metyrapone Mifepristone This medication may also interact with the following: Aminoglutethimide Amphotericin B Aspirin and aspirin-like medications Barbiturates Certain medications for diabetes, like glipizide or glyburide Cholestyramine Cholinesterase inhibitors Cyclosporine Digoxin Diuretics Ephedrine Female hormones, like estrogens and birth control pills Isoniazid Ketoconazole NSAIDS, medications for pain and inflammation, like ibuprofen or naproxen Phenytoin Rifampin Toxoids Vaccines Warfarin This list may not describe all possible interactions. Give your health care provider a list of all the medicines, herbs, non-prescription drugs, or dietary supplements you use. Also tell them if you smoke, drink alcohol, or use illegal drugs. Some items may interact with your medicine. What should I watch for while using this medication? Visit your care team for regular checks on your progress. If you are taking this medication over a prolonged period, carry an identification card with your name and address, the type and dose of your medication, and your care team's name and address. This medication may increase your risk of getting an infection. Tell your care team if you are around anyone with measles or chickenpox, or if you develop sores or blisters that do not heal properly. If you are going to have surgery, tell your care team that you have taken this medication within the last twelve months. Ask your care team about your diet. You may  need to lower the amount of salt you eat. This medication may increase blood sugar. Ask your care team if  changes in diet or medications are needed if you have diabetes. What side effects may I notice from receiving this medication? Side effects that you should report to your care team as soon as possible: Allergic reactions-skin rash, itching, hives, swelling of the face, lips, tongue, or throat Cushing syndrome-increased fat around the midsection, upper back, neck, or face, pink or purple stretch marks on the skin, thinning, fragile skin that easily bruises, unexpected hair growth High blood sugar (hyperglycemia)-increased thirst or amount of urine, unusual weakness or fatigue, blurry vision Increase in blood pressure Infection-fever, chills, cough, sore throat, wounds that don't heal, pain or trouble when passing urine, general feeling of discomfort or being unwell Low adrenal gland function-nausea, vomiting, loss of appetite, unusual weakness or fatigue, dizziness Mood and behavior changes-anxiety, nervousness, confusion, hallucinations, irritability, hostility, thoughts of suicide or self-harm, worsening mood, feelings of depression Stomach bleeding-bloody or black, tar-like stools, vomiting blood or brown material that looks like coffee grounds Swelling of the ankles, hands, or feet Side effects that usually do not require medical attention (report to your care team if they continue or are bothersome): Acne General discomfort and fatigue Headache Increase in appetite Nausea Trouble sleeping Weight gain This list may not describe all possible side effects. Call your doctor for medical advice about side effects. You may report side effects to FDA at 1-800-FDA-1088. Where should I keep my medication? Keep out of the reach of children. Store at room temperature between 15 and 30 degrees C (59 and 86 degrees F). Protect from light and moisture. Keep container tightly closed. Throw away any unused medication after the expiration date. NOTE: This sheet is a summary. It may not cover all possible  information. If you have questions about this medicine, talk to your doctor, pharmacist, or health care provider.  2022 Elsevier/Gold Standard (2020-03-01 11:24:24) Atopic Dermatitis Atopic dermatitis is a skin disorder that causes inflammation of the skin. It is marked by a red rash and itchy, dry, scaly skin. It is the most common type of eczema. Eczema is a group of skin conditions that cause the skin to become rough and swollen. This condition is generally worse during the cooler winter months and often improves during the warm summer months. Atopic dermatitis usually starts showing signs in infancy and can last through adulthood. This condition cannot be passed from one person to another (is not contagious). Atopic dermatitis may not always be present, but when it is, it is called a flare-up. What are the causes? The exact cause of this condition is not known. Flare-ups may be triggered by: Coming in contact with something that you are sensitive or allergic to (allergen). Stress. Certain foods. Extremely hot or cold weather. Harsh chemicals and soaps. Dry air. Chlorine. What increases the risk? This condition is more likely to develop in people who have a personal or family history of: Eczema. Allergies. Asthma. Hay fever. What are the signs or symptoms? Symptoms of this condition include: Dry, scaly skin. Red, itchy rash. Itchiness, which can be severe. This may occur before the skin rash. This can make sleeping difficult. Skin thickening and cracking that can occur over time. How is this diagnosed? This condition is diagnosed based on: Your symptoms. Your medical history. A physical exam. How is this treated? There is no cure for this condition, but symptoms can usually be controlled. Treatment focuses on: Controlling  the itchiness and scratching. You may be given medicines, such as antihistamines or steroid creams. Limiting exposure to allergens. Recognizing situations  that cause stress and developing a plan to manage stress. If your atopic dermatitis does not get better with medicines, or if it is all over your body (widespread), a treatment using a specific type of light (phototherapy) may be used. Follow these instructions at home: Skin care  Keep your skin well moisturized. Doing this seals in moisture and helps to prevent dryness. Use unscented lotions that have petroleum in them. Avoid lotions that contain alcohol or water. They can dry the skin. Keep baths or showers short (less than 5 minutes) in warm water. Do not use hot water. Use mild, unscented cleansers for bathing. Avoid soap and bubble bath. Apply a moisturizer to your skin right after a bath or shower. Do not apply anything to your skin without checking with your health care provider. General instructions Take or apply over-the-counter and prescription medicines only as told by your health care provider. Dress in clothes made of cotton or cotton blends. Dress lightly because heat increases itchiness. When washing your clothes, rinse your clothes twice so all of the soap is removed. Avoid any triggers that can cause a flare-up. Keep your fingernails cut short. Avoid scratching. Scratching makes the rash and itchiness worse. A break in the skin from scratching could result in a skin infection (impetigo). Do not be around people who have cold sores or fever blisters. If you get the infection, it may cause your atopic dermatitis to worsen. Keep all follow-up visits. This is important. Contact a health care provider if: Your itchiness interferes with sleep. Your rash gets worse or is not better within one week of starting treatment. You have a fever. You have a rash flare-up after having contact with someone who has cold sores or fever blisters. Get help right away if: You develop pus or soft yellow scabs in the rash area. Summary Atopic dermatitis causes a red rash and itchy, dry, scaly  skin. Treatment focuses on controlling the itchiness and scratching, limiting exposure to things that you are sensitive or allergic to (allergens), recognizing situations that cause stress, and developing a plan to manage stress. Keep your skin well moisturized. Keep baths or showers shorter than 5 minutes and use warm water. Do not use hot water. This information is not intended to replace advice given to you by your health care provider. Make sure you discuss any questions you have with your health care provider. Document Revised: 10/16/2019 Document Reviewed: 10/16/2019 Elsevier Patient Education  2022 Elsevier Inc. Corneal Abrasion A corneal abrasion is a scratch or injury to the clear covering over the front of the eye (cornea). Your cornea forms a clear dome that protects your eye and helps to focus your vision. Your cornea is made up of many layers, but the surface layer is one of the most sensitive tissues in your body. A corneal abrasion can be very painful. If a corneal abrasion is not treated, it can become infected and cause an ulcer. This can lead to scarring. A scarred cornea can affect your vision. Sometimes abrasions come back in the same area, even after the original injury has healed. What are the causes? This condition may be caused by: A poke in the eye. A gritty or irritating substance (foreign body) in the eye. Excessive eye rubbing. Very dry eyes. Certain eye infections. Contact lenses that fit poorly or are worn for a long period  of time. You can also injure your cornea when putting contact lenses in your eye or taking them out. Eye surgery. Certain cornea problems may increase the chance of a corneal abrasion. Sometimes, the cause is not known. What are the signs or symptoms? Symptoms of this condition include: Eye pain. The pain may get worse when you open and close your eye or when you move your eye. A feeling of something stuck in your eye. Tearing, redness, and  sensitivity to light. Having trouble keeping your eye open, or not being able to keep it open. Blurred vision. Headache. How is this diagnosed? You may work with a health care provider who specializes in diseases and conditions of the eye (ophthalmologist). This condition may be diagnosed based on your medical history, symptoms, and an eye exam. Before the eye exam, numbing drops may be put into your eye. You may also have dye put in your eye with a dropper or a small paper strip. The dye makes the abrasion easy to see when your ophthalmologist examines your eye with a light. Your ophthalmologist may look at your eye through an eye scope (slit lamp). How is this treated? Treatment may vary depending on the cause of your condition, and it may include: Washing out your eye. Removing any foreign bodies that are in your eye. Using antibiotic drops or ointment to treat or prevent an infection. Using a dilating drop to decrease inflammation and pain. Using steroid drops or ointment to treat redness, irritation, or inflammation. Applying a cold, wet cloth (cold compress) or ice pack to ease the pain. Taking pain medicine by mouth (orally). In some cases, an eye patch or bandage soft contact lens might also be used. An eye patch should not be used if the corneal abrasion was related to contact lens wear as it can increase the chance of infection in these eyes. Follow these instructions at home: Medicines Use eye drops or ointments as told by your health care provider. If you were prescribed antibiotic drops or ointment, use them as told by your health care provider. Do not stop using the antibiotic even if you start to feel better. Take over-the-counter and prescription medicines only as told by your health care provider. Ask your health care provider if the medicine prescribed to you: Requires you to avoid driving or using heavy machinery. Can cause constipation. You may need to take these actions  to prevent or treat constipation: Drink enough fluid to keep your urine pale yellow. Take over-the-counter or prescription medicines. Eat foods that are high in fiber, such as beans, whole grains, and fresh fruits and vegetables. Limit foods that are high in fat and processed sugars, such as fried or sweet foods. Eye patch use If you have an eye patch, wear it as told by your health care provider. Do not drive or use machinery while wearing an eye patch. Your ability to judge distances will be impaired. Follow instructions from your health care provider about when to remove the patch. General instructions Ask your health care provider whether you can use a cold compress on your eye to relieve pain. Do not rub or touch your eye. Do not wash out your eye. Do not wear contact lenses until your health care provider says that this is okay. Avoid bright light and eye strain. Keep all follow-up visits as told by your health care provider. This is important for preventing infection and vision loss. Contact a health care provider if: You continue to have  eye pain and other symptoms for more than 2 days. You have new symptoms, such as worse redness, tearing, or discharge. You have discharge that makes your eyelids stick together in the morning. Your eye patch becomes so loose that you can blink your eye. Symptoms return after the original abrasion has healed. Get help right away if: You have severe eye pain that does not get better with medicine. You have vision loss. Summary A corneal abrasion is a scratch or injury to the clear covering over the front of the eye (cornea). It is important to get treatment for a corneal abrasion. If this problem is not treated, it can affect your vision. Use eye drops or ointments as told by your health care provider. If you have an eye patch, do not drive or use machinery while wearing it. Your ability to judge distances will be impaired. Let your health care  provider know if your symptoms continue for more than 2 days. This information is not intended to replace advice given to you by your health care provider. Make sure you discuss any questions you have with your health care provider. Document Revised: 05/13/2018 Document Reviewed: 05/13/2018 Elsevier Patient Education  2022 [employer]

## 2020-09-23 NOTE — Progress Notes (Signed)
Subjective:    Patient ID: [name], female    DOB: [birth year], 50 y.o.   MRN: [id number]  HPI 50 yo female in non acute distress.  Exposed on Thursday  to her daughter who tested Friday with Covid-19.   2 weeks ago  history of swelling and  itching to eyelids, she used neomycin and polymyxyn b. And Systane andit seemed to imrprove , used for  7 days.  Last night 3am   Feels like something in right eye. And woke up with eye discharged sealed shut, got in the shower to the discharge. Used Ice last night on eyes.    Review of Systems  Eyes:  Positive for photophobia, pain, discharge, redness and itching. Negative for visual disturbance.  Allergic/Immunologic: Positive for environmental allergies (currently taking nothing.).  Neurological:  Negative for dizziness, syncope, light-headedness and headaches.     Moderna vaccinated and 1 booster. Objective:   Physical Exam Eyes:     General: Vision grossly intact.        Right eye: No foreign body, discharge or hordeolum.        Left eye: No foreign body, discharge or hordeolum.     Extraocular Movements: Extraocular movements intact.     Conjunctiva/sclera:     Right eye: Right conjunctiva is injected. Chemosis present. No exudate or hemorrhage.    Left eye: Left conjunctiva is injected. No exudate or hemorrhage.    Pupils: Pupils are equal, round, and reactive to light.   Musculoskeletal:     Cervical back: Normal range of motion.  Lymphadenopathy:     Cervical: No cervical adenopathy.   Eyes lids, erythema, swelling noted Sclera injected R>L Right side with chemosis , Perrla and EOMI. Fluorescein stain mild uptake on the upper part of the cornia.   Covid-19 POC negative Covid PCR Pending Assessment & Plan:  Swollen eyes bilateral Abrasion on right eye most likely from itching eyes last night. Eczema flare up on right thumb possibly her eye lids. Conjunctivitis pending Covid-19 PCR  OTC Zyrtec , Ibuprofen  200mg   every 6 hours with food. on Friday 09/27/20 1:30pm. Meds ordered this encounter  Medications   predniSONE (STERAPRED UNI-PAK 21 TAB) 10 MG (21) TBPK tablet    Sig: Take 6 tablets by mouth today then  5 tablets tomorrow then one less tablet every day there after. Take with food.    Dispense:  21 tablet    Refill:  0   ciprofloxacin (CILOXAN) 0.3 % ophthalmic solution    Sig: Place 1 drop into both eyes every 2 (two) hours. Administer 1 drop, every 2 hours, while awake, for 2 days. Then 1 drop, every 4 hours, while awake, for the next 5 days.    Dispense:  5 mL    Refill:  0   Patient verbalizes understanding and has no questions at discharge.  She is to contact HR to be deemed a close contact, then to go home and wait for PCR. Orders Placed This Encounter  Procedures   Novel Coronavirus, NAA ([facility])    Order Specific Question:   Is this test for diagnosis or screening    Answer:   Diagnosis of ill patient    Order Specific Question:   Symptomatic for COVID-19 as defined by CDC    Answer:   Yes    Order Specific Question:   Date of Symptom Onset    Answer:   09/22/2020    Order Specific Question:  Hospitalized for COVID-19    Answer:   No    Order Specific Question:   Admitted to ICU for COVID-19    Answer:   No    Order Specific Question:   Previously tested for COVID-19    Answer:   Yes    Order Specific Question:   Resident in a congregate (group) care setting    Answer:   No    Order Specific Question:   Is the patient student?    Answer:   No    Order Specific Question:   Employed in healthcare setting    Answer:   No    Order Specific Question:   Pregnant    Answer:   No    Order Specific Question:   Has patient completed COVID vaccination(s) (2 doses of Pfizer/Moderna 1 dose of [employer])    Answer:   Yes    Order Specific Question:   Has patient completed COVID Booster / 3rd dose    Answer:   Yes    Order Specific Question:   Release to patient     Answer:   Immediate   POC COVID-19    Order Specific Question:   Is this test for diagnosis or screening    Answer:   Diagnosis of ill patient    Order Specific Question:   Symptomatic for COVID-19 as defined by CDC    Answer:   Yes    Order Specific Question:   Date of Symptom Onset    Answer:   09/22/2020    Order Specific Question:   Hospitalized for COVID-19    Answer:   No    Order Specific Question:   Admitted to ICU for COVID-19    Answer:   No    Order Specific Question:   Previously tested for COVID-19    Answer:   No    Order Specific Question:   Resident in a congregate (group) care setting    Answer:   No    Order Specific Question:   Employed in healthcare setting    Answer:   No    Order Specific Question:   Pregnant    Answer:   No

## 2020-09-26 LAB — NOVEL CORONAVIRUS, NAA: SARS-CoV-2, NAA: NOT DETECTED

## 2020-09-26 LAB — SARS-COV-2, NAA 2 DAY TAT

## 2020-09-27 ENCOUNTER — Ambulatory Visit: Payer: Self-pay | Admitting: Nurse Practitioner

## 2020-09-27 ENCOUNTER — Other Ambulatory Visit: Payer: Self-pay

## 2020-09-27 DIAGNOSIS — H44009 Unspecified purulent endophthalmitis, unspecified eye: Secondary | ICD-10-CM

## 2020-09-27 NOTE — Progress Notes (Signed)
   Subjective:    Patient ID: [name], female    DOB: 27-Jun-1970, 50 y.o.   MRN: [id number]  HPI  50 year old female presenting to [facility] Staff for f/u regarding her eye infection. She is still taking prednisone and using Cipro eye drops   She is feeling much improved, vision is normal.   Unable to make it to in person appointment, she is in [place] today.  Consents to telephone appointment  Review of Systems  Constitutional: Negative.   HENT: Negative.    Eyes: Negative.   Respiratory: Negative.    Cardiovascular: Negative.   Endocrine: Negative.   Genitourinary: Negative.       Objective:   Physical Exam  No physical assessment performed, patient in no acute distress during phone conversation with provider       Assessment & Plan:   1. Eye infection, unspecified laterality Continue management and follow up with [facility] as needed.

## 2020-10-01 ENCOUNTER — Ambulatory Visit: Payer: BC Managed Care – PPO | Admitting: Medical

## 2020-10-01 ENCOUNTER — Encounter: Payer: Self-pay | Admitting: Medical

## 2020-10-01 ENCOUNTER — Other Ambulatory Visit: Payer: Self-pay

## 2020-10-01 DIAGNOSIS — R0602 Shortness of breath: Secondary | ICD-10-CM

## 2020-10-01 DIAGNOSIS — J40 Bronchitis, not specified as acute or chronic: Secondary | ICD-10-CM | POA: Diagnosis not present

## 2020-10-01 DIAGNOSIS — U071 COVID-19: Secondary | ICD-10-CM | POA: Diagnosis not present

## 2020-10-01 DIAGNOSIS — R42 Dizziness and giddiness: Secondary | ICD-10-CM

## 2020-10-01 DIAGNOSIS — R059 Cough, unspecified: Secondary | ICD-10-CM

## 2020-10-01 NOTE — Progress Notes (Addendum)
   Subjective:    Patient ID: [name], female    DOB: Jun 01, 1970, 50 y.o.   MRN: [id number]  HPI 50 yo female in non acute distress consents to telemedicine appointment. Symptoms started Saturday evening9/10/22 both husband and son were sick with Covid-19. Trouble  breathing , body aches, back pain,  took ibuprofen, dry cough, Tested positive on  Sunday morning. Dizzy with standing  up, feels ears are full.  Terrible night on Sunday to Monday and Monday to Tuesday. Walked up one flight of steps at home and got short nessof breath. Felt like a weight was on her chest. Coughing all night long could not catch breath.  Allergies  Allergen Reactions   Erythromycin Other (See Comments)    Severe abdominal pain   Penicillins Hives     Review of Systems  Constitutional:  Positive for chills, fatigue and fever.  HENT:  Positive for congestion. Negative for postnasal drip and rhinorrhea.   Respiratory:  Positive for cough and shortness of breath.   Cardiovascular:  Negative for chest pain.  Neurological:  Positive for dizziness.  Just finished cipro eye drops and prednisone taper.   Non smoker Objective:   Physical Exam AXOX3 Non acute distress noted on phone call No physical exam performed due to telemedicine appointment.      Cough noted on phone call. Assessment & Plan:  Covid-19 Shortness of breath,  Cough Dizziness  Recommmended to be seen at urgent care needs x-ray and  O2% Sat check.. Patient verbalizes understanding and has no questions at the end of our conversation. If she has any concerns I asked patient to call  me back. Patient verbalizes and understands plan and has no questions at the end of our conversation.

## 2020-10-22 DIAGNOSIS — L239 Allergic contact dermatitis, unspecified cause: Secondary | ICD-10-CM | POA: Diagnosis not present

## 2020-10-28 DIAGNOSIS — L2089 Other atopic dermatitis: Secondary | ICD-10-CM | POA: Diagnosis not present

## 2020-10-28 DIAGNOSIS — D225 Melanocytic nevi of trunk: Secondary | ICD-10-CM | POA: Diagnosis not present

## 2020-10-28 DIAGNOSIS — D2272 Melanocytic nevi of left lower limb, including hip: Secondary | ICD-10-CM | POA: Diagnosis not present

## 2020-10-28 DIAGNOSIS — L308 Other specified dermatitis: Secondary | ICD-10-CM | POA: Diagnosis not present

## 2020-11-27 DIAGNOSIS — L2089 Other atopic dermatitis: Secondary | ICD-10-CM | POA: Diagnosis not present

## 2020-11-27 DIAGNOSIS — L308 Other specified dermatitis: Secondary | ICD-10-CM | POA: Diagnosis not present

## 2021-01-02 DIAGNOSIS — Z01419 Encounter for gynecological examination (general) (routine) without abnormal findings: Secondary | ICD-10-CM | POA: Diagnosis not present

## 2021-01-02 DIAGNOSIS — Z1231 Encounter for screening mammogram for malignant neoplasm of breast: Secondary | ICD-10-CM | POA: Diagnosis not present

## 2021-01-02 LAB — HM MAMMOGRAPHY

## 2021-01-21 ENCOUNTER — Telehealth: Payer: Self-pay

## 2021-01-21 NOTE — Telephone Encounter (Signed)
CALLED PATIENT NO ANSWER LEFT VOICEMAIL FOR A CALL BACK ? ?

## 2021-01-22 ENCOUNTER — Telehealth: Payer: Self-pay

## 2021-01-22 NOTE — Telephone Encounter (Signed)
CALLED PATIENT NO ANSWER LEFT VOICEMAIL FOR A CALL BACK ? ?

## 2021-01-23 ENCOUNTER — Telehealth: Payer: Self-pay

## 2021-01-23 NOTE — Telephone Encounter (Signed)
Letter sent no answer °

## 2021-02-03 DIAGNOSIS — M7711 Lateral epicondylitis, right elbow: Secondary | ICD-10-CM | POA: Diagnosis not present

## 2021-03-14 ENCOUNTER — Other Ambulatory Visit: Payer: Self-pay

## 2021-03-14 ENCOUNTER — Encounter: Payer: Self-pay | Admitting: Nurse Practitioner

## 2021-03-14 ENCOUNTER — Ambulatory Visit: Payer: Self-pay | Admitting: Nurse Practitioner

## 2021-03-14 VITALS — BP 114/74 | HR 79 | Temp 98.2°F | Resp 16

## 2021-03-14 DIAGNOSIS — N390 Urinary tract infection, site not specified: Secondary | ICD-10-CM

## 2021-03-14 DIAGNOSIS — R319 Hematuria, unspecified: Secondary | ICD-10-CM

## 2021-03-14 LAB — POCT URINALYSIS DIPSTICK
Bilirubin, UA: NEGATIVE
Glucose, UA: NEGATIVE
Ketones, UA: NEGATIVE
Nitrite, UA: NEGATIVE
Protein, UA: NEGATIVE
Spec Grav, UA: >=1.03 — AB (ref 1.010–1.025)
Urobilinogen, UA: 0.2 E.U./dL
pH, UA: 5 (ref 5.0–8.0)

## 2021-03-14 MED ORDER — NITROFURANTOIN MONOHYD MACRO 100 MG PO CAPS
100.0000 mg | ORAL_CAPSULE | Freq: Two times a day (BID) | ORAL | 0 refills | Status: AC
Start: 2021-03-14 — End: 2021-03-19

## 2021-03-14 MED ORDER — NITROFURANTOIN MONOHYD MACRO 100 MG PO CAPS: 100.0000 mg | ORAL_CAPSULE | Freq: Two times a day (BID) | ORAL | 0 refills | Status: DC

## 2021-03-14 NOTE — Progress Notes (Signed)
Subjective:    Patient ID: [name], female    DOB: Apr 22, 1970, 51 y.o.   MRN: [id number]  HPI [name] presents to the clinic with complaints of pain, burning, and frequency with urination x 2 days. Denies fever, chills, nausea, or vomiting. States that she had a kidney stone a few months ago and this does not feel the same. She denies taking anything OTC to relieve symptoms.   Review of Systems  Constitutional: Negative.   HENT: Negative.    Eyes: Negative.   Respiratory: Negative.    Cardiovascular: Negative.   Gastrointestinal: Negative.   Endocrine: Negative.   Genitourinary:  Positive for dysuria, frequency, hematuria and urgency. Negative for flank pain.  Musculoskeletal: Negative.   Skin: Negative.   Allergic/Immunologic: Negative.   Neurological: Negative.   Hematological: Negative.   Psychiatric/Behavioral: Negative.        Vitals:   03/14/21 0952  BP: 114/74  Pulse: 79  Resp: 16  Temp: 98.2 F (36.8 C)  SpO2: 97%    Objective:   Physical Exam Constitutional:      Appearance: Normal appearance. She is normal weight.  HENT:     Head: Normocephalic and atraumatic.     Nose: Nose normal.     Mouth/Throat:     Mouth: Mucous membranes are moist.  Cardiovascular:     Rate and Rhythm: Normal rate and regular rhythm.     Heart sounds: No murmur heard.   No friction rub. No gallop.  Pulmonary:     Effort: Pulmonary effort is normal.     Breath sounds: Normal breath sounds.  Abdominal:     Tenderness: There is no right CVA tenderness or left CVA tenderness.  Musculoskeletal:        General: Normal range of motion.     Cervical back: Normal range of motion.  Skin:    General: Skin is warm and dry.  Neurological:     General: No focal deficit present.     Mental Status: She is alert and oriented to person, place, and time.  Psychiatric:        Mood and Affect: Mood normal.        Behavior: Behavior normal.        Thought Content: Thought content  normal.        Judgment: Judgment normal.          Assessment & Plan:  1. Urinary tract infection with hematuria, site unspecified  - POCT Urinalysis Dipstick Recent Results (from the past 2160 hour(s))  POCT Urinalysis Dipstick     Status: Abnormal   Collection Time: 03/14/21  9:56 AM  Result Value Ref Range   Color, UA yellow    Clarity, UA clear    Glucose, UA Negative Negative   Bilirubin, UA Negative    Ketones, UA Negative    Spec Grav, UA >=1.030 (A) 1.010 - 1.025   Blood, UA 3+    pH, UA 5.0 5.0 - 8.0   Protein, UA Negative Negative   Urobilinogen, UA 0.2 0.2 or 1.0 E.U./dL   Nitrite, UA Negative    Leukocytes, UA Moderate (2+) (A) Negative   Appearance     Odor      - nitrofurantoin, macrocrystal-monohydrate, (MACROBID) 100 MG capsule; Take 1 capsule (100 mg total) by mouth 2 (two) times daily for 5 days.  Dispense: 10 capsule; Refill: 0  Instructed to push fluid, avoid bladder irritants, and take macrobid as prescribed. She was also instructed  to get AZO Standard OTC for bladder spasms. Discussed side effects of AZO causing orange urine and staining contacts. If symptoms to not improve within 48 hours she was instructed to follow-up with Urgent Care.

## 2021-03-14 NOTE — Addendum Note (Signed)
Addended by: Viviano Simas E on: 03/14/2021 01:04 PM   Modules accepted: Orders

## 2021-03-14 NOTE — Addendum Note (Signed)
Addended by: Harlow Mares on: 03/14/2021 10:38 AM   Modules accepted: Orders

## 2021-03-18 DIAGNOSIS — M7701 Medial epicondylitis, right elbow: Secondary | ICD-10-CM | POA: Diagnosis not present

## 2021-03-18 DIAGNOSIS — M7711 Lateral epicondylitis, right elbow: Secondary | ICD-10-CM | POA: Diagnosis not present

## 2021-03-19 ENCOUNTER — Encounter: Payer: Self-pay | Admitting: Nurse Practitioner

## 2021-03-19 LAB — URINE CULTURE

## 2021-03-20 ENCOUNTER — Ambulatory Visit: Payer: Self-pay | Admitting: Nurse Practitioner

## 2021-03-20 ENCOUNTER — Other Ambulatory Visit: Payer: Self-pay

## 2021-03-20 ENCOUNTER — Encounter: Payer: Self-pay | Admitting: Nurse Practitioner

## 2021-03-20 VITALS — BP 104/70 | HR 72 | Temp 98.0°F | Resp 16

## 2021-03-20 DIAGNOSIS — N3000 Acute cystitis without hematuria: Secondary | ICD-10-CM

## 2021-03-20 LAB — POCT URINALYSIS DIPSTICK
Bilirubin, UA: NEGATIVE
Blood, UA: NEGATIVE
Glucose, UA: NEGATIVE
Ketones, UA: NEGATIVE
Nitrite, UA: NEGATIVE
Protein, UA: NEGATIVE
Spec Grav, UA: 1.015 (ref 1.010–1.025)
Urobilinogen, UA: 0.2 E.U./dL
pH, UA: 7 (ref 5.0–8.0)

## 2021-03-20 MED ORDER — SULFAMETHOXAZOLE-TRIMETHOPRIM 800-160 MG PO TABS: 1.0000 | ORAL_TABLET | Freq: Two times a day (BID) | ORAL | 0 refills | Status: DC

## 2021-03-20 NOTE — Progress Notes (Addendum)
? ?Subjective:  ? ? Patient ID: [name], female    DOB: 09/03/1970, 51 y.o.   MRN: [id number] ? ?HPI ?51 year old caucasian female presents to the clinic for f/u UTI check. She states that she finished her Macrobid on Wednesday but she continues to have burning sensation with voiding and is not feeling quite 100%. She denies fever, chills, body aches. ? ? ?Review of Systems  ?Constitutional: Negative.   ?HENT: Negative.    ?Eyes: Negative.   ?Respiratory: Negative.    ?Gastrointestinal: Negative.   ?Endocrine: Negative.   ?Genitourinary:  Positive for dysuria and frequency.  ?     Burning with urination  ?Musculoskeletal: Negative.   ?Allergic/Immunologic: Negative.   ?Neurological: Negative.   ?Hematological: Negative.   ?Psychiatric/Behavioral: Negative.    ? ?   ?Objective:  ? Physical Exam ?Constitutional:   ?   General: She is not in acute distress. ?   Appearance: Normal appearance. She is normal weight. She is not ill-appearing.  ?Musculoskeletal:     ?   General: Normal range of motion.  ?   Cervical back: Normal range of motion.  ?Skin: ?   General: Skin is warm and dry.  ?Neurological:  ?   General: No focal deficit present.  ?   Mental Status: She is alert and oriented to person, place, and time.  ?Psychiatric:     ?   Mood and Affect: Mood normal.     ?   Behavior: Behavior normal.     ?   Thought Content: Thought content normal.     ?   Judgment: Judgment normal.  ? ?Vitals:  ? 03/20/21 1124  ?BP: 104/70  ?Pulse: 72  ?Resp: 16  ?Temp: 98 ?F (36.7 ?C)  ?SpO2: 96%  ?  ? ? ? ?Recent Results (from the past 2160 hour(s))  ?POCT Urinalysis Dipstick     Status: Abnormal  ? Collection Time: 03/14/21  9:56 AM  ?Result Value Ref Range  ? Color, UA yellow   ? Clarity, UA clear   ? Glucose, UA Negative Negative  ? Bilirubin, UA Negative   ? Ketones, UA Negative   ? Spec Grav, UA >=1.030 (A) 1.010 - 1.025  ? Blood, UA 3+   ? pH, UA 5.0 5.0 - 8.0  ? Protein, UA Negative Negative  ? Urobilinogen, UA 0.2 0.2 or  1.0 E.U./dL  ? Nitrite, UA Negative   ? Leukocytes, UA Moderate (2+) (A) Negative  ? Appearance    ? Odor    ?Urine Culture     Status: Abnormal  ? Collection Time: 03/14/21 10:38 AM  ? Specimen: Urine  ? Urine  ?Result Value Ref Range  ? Urine Culture, Routine Final report (A)   ? Organism ID, Bacteria Escherichia coli (A)   ?  Comment: Cefazolin <=4 ug/mL ?Cefazolin with an MIC <=16 predicts susceptibility to the oral agents ?cefaclor, cefdinir, cefpodoxime, cefprozil, cefuroxime, cephalexin, ?and loracarbef when used for therapy of uncomplicated urinary tract ?infections due to E. coli, Klebsiella pneumoniae, and Proteus ?mirabilis. ?25,000-50,000 colony forming units per mL ?  ? Antimicrobial Susceptibility Comment   ?  Comment:       ** S = Susceptible; I = Intermediate; R = Resistant ** ?                   P = Positive; N = Negative ?            MICS are expressed  in micrograms per mL ?   Antibiotic                 RSLT#1    RSLT#2    RSLT#3    RSLT#4 ?Amoxicillin/Clavulanic Acid    S ?Ampicillin                     S ?Cefepime                       S ?Ceftriaxone                    S ?Cefuroxime                     S ?Ciprofloxacin                  S ?Ertapenem                      S ?Gentamicin                     S ?Imipenem                       S ?Levofloxacin                   S ?Meropenem                      S ?Nitrofurantoin                 S ?Piperacillin/Tazobactam        S ?Tetracycline                   S ?Tobramycin                     S ?Trimethoprim/Sulfa             S ?  ?POCT Urinalysis Dipstick     Status: Abnormal  ? Collection Time: 03/20/21 11:29 AM  ?Result Value Ref Range  ? Color, UA yellow   ? Clarity, UA clear   ? Glucose, UA Negative Negative  ? Bilirubin, UA Negative   ? Ketones, UA Negative   ? Spec Grav, UA 1.015 1.010 - 1.025  ? Blood, UA Negative   ? pH, UA 7.0 5.0 - 8.0  ? Protein, UA Negative Negative  ? Urobilinogen, UA 0.2 0.2 or 1.0 E.U./dL  ? Nitrite, UA Negative   ?  Leukocytes, UA Small (1+) (A) Negative  ? Appearance    ? Odor    ?  ?   ?Assessment & Plan:  ?1. Acute cystitis without hematuria ? ?- POCT Urinalysis Dipstick ?- sulfamethoxazole-trimethoprim (BACTRIM DS) 800-160 MG tablet; Take 1 tablet by mouth 2 (two) times daily for 3 days.  Dispense: 6 tablet; Refill: 0 ?   POC UA continues to show leukocytes. Rx for 3-day course of Bactrim sent to pharmacy. Instructed to conintue pushing fluids and to take ABX twice daily for 2 days. If symptoms persist she is to RTC. ? ?Meds ordered this encounter  ?Medications  ? sulfamethoxazole-trimethoprim (BACTRIM DS) 800-160 MG tablet  ?  Sig: Take 1 tablet by mouth 2 (two) times daily for 3 days.  ?  Dispense:  6 tablet  ?  Refill:  0  ?  ? ?

## 2021-03-23 ENCOUNTER — Encounter: Payer: Self-pay | Admitting: Nurse Practitioner

## 2021-03-23 ENCOUNTER — Other Ambulatory Visit: Payer: Self-pay | Admitting: Nurse Practitioner

## 2021-03-23 NOTE — Progress Notes (Signed)
Patient reported itching with Bactrim. UTI symptoms have resolved. She has stopped Bactrim. Will add to allergy list going forward. Patient to f/u with any ongoing UTI symptoms  ?

## 2021-03-31 ENCOUNTER — Telehealth: Payer: Self-pay

## 2021-03-31 NOTE — Telephone Encounter (Signed)
Lvm from vm left 03/10 to call back regarding last orthotics dispensed.

## 2021-04-23 ENCOUNTER — Other Ambulatory Visit: Payer: Self-pay

## 2021-04-30 ENCOUNTER — Ambulatory Visit: Payer: Self-pay | Admitting: *Deleted

## 2021-04-30 NOTE — Telephone Encounter (Signed)
?  Chief Complaint: dizziness  ?Symptoms: feeling "foggy" and "heavy headed". Can stand and walk without issue. Comes and goes. Noted sitting and looking at computer during the day  ?Frequency: 3 days  ?Pertinent Negatives: Patient denies chest pain difficulty breathing, no headaches. No vomiting no nausea.  ?Disposition: [] ED /[] Urgent Care (no appt availability in office) / [] Appointment(In office/virtual)/ []  [facility]/ [] Home Care/ [] Refused Recommended Disposition /[] [facility]/ [x]  Follow-up with PCP ?Additional Notes:  ? ?No available appt until 05/05/21. Patient is personal friend to L. Lucio Edward, [place]. Will see her if she is ok with treating her. Please advise for appt. Also advised to do UC virtual visit if needed. ? ? Reason for Disposition ? [1] MILD dizziness (e.g., walking normally) AND [2] has NOT been evaluated by physician for this  (Exception: dizziness caused by heat exposure, sudden standing, or poor fluid intake) ? ?Answer Assessment - Initial Assessment Questions ?1. DESCRIPTION: "Describe your dizziness." ?    "Heavy headed", "foggy" feeling  ?2. LIGHTHEADED: "Do you feel lightheaded?" (e.g., somewhat faint, woozy, weak upon standing) ?    Na  ?3. VERTIGO: "Do you feel like either you or the room is spinning or tilting?" (i.e. vertigo) ?    no ?4. SEVERITY: "How bad is it?"  "Do you feel like you are going to faint?" "Can you stand and walk?" ?  - MILD: Feels slightly dizzy, but walking normally. ?  - MODERATE: Feels unsteady when walking, but not falling; interferes with normal activities (e.g., school, work). ?  - SEVERE: Unable to walk without falling, or requires assistance to walk without falling; feels like passing out now.  ?    Mild but unsteady when standing  ?5. ONSET:  "When did the dizziness begin?" ?    3 days ago  ?6. AGGRAVATING FACTORS: "Does anything make it worse?" (e.g., standing, change in head position) ?    Looking at computer ?7. HEART RATE: "Can you  tell me your heart rate?" "How many beats in 15 seconds?"  (Note: not all patients can do this)   ?    Heart feels like it flutters  ?8. CAUSE: "What do you think is causing the dizziness?" ?    Not sure  ?9. RECURRENT SYMPTOM: "Have you had dizziness before?" If Yes, ask: "When was the last time?" "What happened that time?" ?    Yes but recently more than normal  ?10. OTHER SYMPTOMS: "Do you have any other symptoms?" (e.g., fever, chest pain, vomiting, diarrhea, bleeding) ?      Feeling "foggy" dizzziness ?11. PREGNANCY: "Is there any chance you are pregnant?" "When was your last menstrual period?" ?      na ? ?Protocols used: Dizziness - Lightheadedness-A-AH ? ?

## 2021-05-01 NOTE — Telephone Encounter (Signed)
Lvm to schedule appt.

## 2021-05-09 ENCOUNTER — Ambulatory Visit (INDEPENDENT_AMBULATORY_CARE_PROVIDER_SITE_OTHER): Payer: BC Managed Care – PPO

## 2021-05-09 ENCOUNTER — Other Ambulatory Visit: Payer: Self-pay

## 2021-05-09 ENCOUNTER — Ambulatory Visit (INDEPENDENT_AMBULATORY_CARE_PROVIDER_SITE_OTHER): Payer: BC Managed Care – PPO | Admitting: Podiatry

## 2021-05-09 DIAGNOSIS — M722 Plantar fascial fibromatosis: Secondary | ICD-10-CM

## 2021-05-09 NOTE — Progress Notes (Signed)
? ?  Subjective: ?51 y.o. female presenting today for follow-up evaluation of chronic plantar fasciitis to the bilateral heels.  Patient states that the orthotics helped significantly.  She was last seen here in the office in 2021.  Presenting today to get really molded for orthotics ? ? ?Past Medical History:  ?Diagnosis Date  ? Allergy   ? Anemia   ? Childhood  ? Arthritis   ? Asthma   ? exercise induced  ? Back pain   ? bulging disc  ? Back pain, chronic   ? Cellulitis   ? Clinical depression   ? Eczema   ? Endometritis   ? Fatigue   ? Headache   ? Hernia   ? Umbilical  ? History of chicken pox   ? Insomnia   ? Irregular menses   ? Kidney stones   ? Ovarian cyst   ? Over weight   ? Rectus diastasis   ? Right hip pain   ? Yeast infection   ? ? ? ?Objective: ?Physical Exam ?General: The patient is alert and oriented x3 in no acute distress. ? ?Dermatology: Skin is warm, dry and supple bilateral lower extremities. Negative for open lesions or macerations bilateral.  ? ?Vascular: Dorsalis Pedis and Posterior Tibial pulses palpable bilateral.  Capillary fill time is immediate to all digits. ? ?Neurological: Epicritic and protective threshold intact bilateral.  ? ?Musculoskeletal: Chronic low-grade tenderness to palpation to the plantar aspect of the bilateral heels along the plantar fascia. All other joints range of motion within normal limits bilateral. Strength 5/5 in all groups bilateral.  ? ? ?Assessment: ?1. plantar fasciitis bilateral feet ?2.  Left knee DJD ? ?Plan of Care:  ?1. Patient evaluated.  ?2.  Today the patient was molded for orthotics by our Pedorthist ?3.  Continue wearing good supportive shoes and sneakers ?4.  Return to clinic as needed ? ?*Loves to be active (hiking, playing tennis).   ? ?Felecia Shelling, DPM ?[facility] ? ?Dr. Felecia Shelling, DPM  ?  ?[address]                                   ?[place], [place] [address]                ?Office [phone]  ?Fax [phone] ? ? ? ? ?

## 2021-05-09 NOTE — Progress Notes (Signed)
SITUATION ?Reason for Consult: Evaluation for Bilateral Custom Foot Orthoses ?Patient / Caregiver Report: Patient is ready for foot orthotics ? ?OBJECTIVE DATA: ?Patient History / Diagnosis:  ?  ICD-10-CM   ?1. Plantar fasciitis, bilateral  M72.2   ?  ? ? ?Current or Previous Devices:   Historical user ? ?Foot Examination: ?Skin presentation:   Intact ?Ulcers & Callousing:   None ?Toe / Foot Deformities:  None ?Weight Bearing Presentation:  Rectus ?Sensation:    Intact ? ?Shoe Size:    55M ? ?ORTHOTIC RECOMMENDATION ?Recommended Device: 1x pair of custom functional foot orthotics ? ?GOALS OF ORTHOSES ?- Reduce Pain ?- Prevent Foot Deformity ?- Prevent Progression of Further Foot Deformity ?- Relieve Pressure ?- Improve the Overall Biomechanical Function of the Foot and Lower Extremity. ? ?ACTIONS PERFORMED ?Potential out of pocket cost was communicated to patient. Patient understood and consent to casting. Patient was casted for Foot Orthoses via crush box. Procedure was explained and patient tolerated procedure well. Casts were shipped to central fabrication. All questions were answered and concerns addressed. ? ?PLAN ?Patient is to be called for fitting when devices are ready.  ? ? ?

## 2021-05-27 DIAGNOSIS — M7701 Medial epicondylitis, right elbow: Secondary | ICD-10-CM | POA: Diagnosis not present

## 2021-06-20 ENCOUNTER — Ambulatory Visit: Payer: BC Managed Care – PPO

## 2021-06-20 DIAGNOSIS — M722 Plantar fascial fibromatosis: Secondary | ICD-10-CM

## 2021-06-20 NOTE — Progress Notes (Signed)
SITUATION: Reason for Visit: Fitting and Delivery of Custom Fabricated Foot Orthoses Patient Report: Patient reports comfort and is satisfied with device.  OBJECTIVE DATA: Patient History / Diagnosis:     ICD-10-CM   1. Plantar fasciitis, bilateral  M72.2       Provided Device:  Custom Functional Foot Orthotics     [employer]: [id number]  GOAL OF ORTHOSIS - Improve gait - Decrease energy expenditure - Improve Balance - Provide Triplanar stability of foot complex - Facilitate motion  ACTIONS PERFORMED Patient was fit with foot orthotics trimmed to shoe last. Patient tolerated fittign procedure.   Patient was provided with verbal and written instruction and demonstration regarding donning, doffing, wear, care, proper fit, function, purpose, cleaning, and use of the orthosis and in all related precautions and risks and benefits regarding the orthosis.  Patient was also provided with verbal instruction regarding how to report any failures or malfunctions of the orthosis and necessary follow up care. Patient was also instructed to contact our office regarding any change in status that may affect the function of the orthosis.  Patient demonstrated independence with proper donning, doffing, and fit and verbalized understanding of all instructions.  PLAN: Patient is to follow up in one week or as necessary (PRN). All questions were answered and concerns addressed. Plan of care was discussed with and agreed upon by the patient.

## 2021-06-25 DIAGNOSIS — B301 Conjunctivitis due to adenovirus: Secondary | ICD-10-CM | POA: Diagnosis not present

## 2021-07-08 DIAGNOSIS — M7711 Lateral epicondylitis, right elbow: Secondary | ICD-10-CM | POA: Diagnosis not present

## 2021-07-16 DIAGNOSIS — M7711 Lateral epicondylitis, right elbow: Secondary | ICD-10-CM | POA: Diagnosis not present

## 2021-07-29 DIAGNOSIS — M7711 Lateral epicondylitis, right elbow: Secondary | ICD-10-CM | POA: Diagnosis not present

## 2021-07-29 DIAGNOSIS — M7701 Medial epicondylitis, right elbow: Secondary | ICD-10-CM | POA: Diagnosis not present

## 2021-08-28 ENCOUNTER — Ambulatory Visit (INDEPENDENT_AMBULATORY_CARE_PROVIDER_SITE_OTHER): Payer: Self-pay | Admitting: Nurse Practitioner

## 2021-08-28 ENCOUNTER — Encounter: Payer: Self-pay | Admitting: Nurse Practitioner

## 2021-08-28 VITALS — BP 116/72 | HR 87 | Temp 98.7°F | Ht 62.0 in | Wt 180.4 lb

## 2021-08-28 DIAGNOSIS — B369 Superficial mycosis, unspecified: Secondary | ICD-10-CM

## 2021-08-28 MED ORDER — NYSTATIN 100000 UNIT/GM EX CREA: 1.0000 | TOPICAL_CREAM | Freq: Two times a day (BID) | CUTANEOUS | 0 refills | Status: DC

## 2021-08-28 MED ORDER — FLUCONAZOLE 150 MG PO TABS: 150.0000 mg | ORAL_TABLET | Freq: Once | ORAL | 0 refills | Status: AC

## 2021-08-28 NOTE — Progress Notes (Signed)
rash

## 2021-08-28 NOTE — Progress Notes (Signed)
   Subjective:    Patient ID: [name], female    DOB: Nov 12, 1970, 51 y.o.   MRN: [id number]  HPI 51 year old female presenting to [facility] with complaints of a rash between her breasts. The rash will itch denies fever or pain.   She has been trying to keep it dry and using moisturizer as needed for irritation.    Today's Vitals   08/28/21 1507  BP: 116/72  Pulse: 87  Temp: 98.7 F (37.1 C)  TempSrc: Tympanic  SpO2: 98%  Weight: 180 lb 6.4 oz (81.8 kg)  Height: 5\' 2"  (1.575 m)   Body mass index is 33 kg/m.   Past Medical History:  Diagnosis Date   Allergy    Anemia    Childhood   Arthritis    Asthma    exercise induced   Back pain    bulging disc   Back pain, chronic    Cellulitis    Clinical depression    Eczema    Endometritis    Fatigue    Headache    Hernia    Umbilical   History of chicken pox    Insomnia    Irregular menses    Kidney stones    Ovarian cyst    Over weight    Rectus diastasis    Right hip pain    Yeast infection      Current Outpatient Medications  Medication Instructions   celecoxib (CELEBREX) 200 MG capsule Celebrex 200 mg capsule  Take 1 capsule twice a day by oral route as needed.   clobetasol ointment (TEMOVATE) 0.05 % APPLY 1 APPLICATION TO SKIN TWICE DAILY   meloxicam (MOBIC) 15 mg, Daily   nystatin cream (MYCOSTATIN) 1 Application, Topical, 2 times daily     Review of Systems  Constitutional: Negative.   HENT: Negative.    Respiratory: Negative.    Cardiovascular: Negative.   Gastrointestinal: Negative.   Musculoskeletal: Negative.   Skin:  Positive for rash.       Objective:   Physical Exam HENT:     Head: Normocephalic.  Pulmonary:     Effort: Pulmonary effort is normal.  Skin:    Findings: Erythema and rash present. Rash is macular.          Comments: Shiny appearing rash between breasts with peeling at edges.  Neurological:     General: No focal deficit present.     Mental  Status: She is alert.           Assessment & Plan:   1. Fungal rash of trunk  - fluconazole (DIFLUCAN) 150 MG tablet; Take 1 tablet (150 mg total) by mouth once for 1 dose.  Dispense: 1 tablet; Refill: 0 - nystatin cream (MYCOSTATIN); Apply 1 Application topically 2 (two) times daily.  Dispense: 30 g; Refill: 0    Keep skin dry  Cool showers  Avoid soaps and lotions with scented/fragrances

## 2021-09-02 ENCOUNTER — Encounter: Payer: Self-pay | Admitting: Nurse Practitioner

## 2021-09-03 ENCOUNTER — Other Ambulatory Visit: Payer: Self-pay | Admitting: Nurse Practitioner

## 2021-09-03 DIAGNOSIS — B379 Candidiasis, unspecified: Secondary | ICD-10-CM

## 2021-09-03 MED ORDER — NYSTATIN 100000 UNIT/GM EX POWD: 1.0000 | Freq: Three times a day (TID) | CUTANEOUS | 0 refills | Status: DC

## 2021-09-03 NOTE — Progress Notes (Signed)
Refilled powder

## 2021-09-05 NOTE — Progress Notes (Deleted)
Name: [name]   MRN: [id number]    DOB: 08-Oct-1970   Date:09/05/2021       Progress Note  Subjective  Chief Complaint  Annual Exam  HPI  Patient presents for annual CPE.  Diet: *** Exercise: ***  Last Eye Exam: *** Last Dental Exam: ***  Flowsheet Row Office Visit from 09/02/2020 in [facility]  AUDIT-C Score 1      Depression: Phq 9 is  {Desc; negative/positive:13464}    09/02/2020    8:16 AM 08/31/2019    8:36 AM 08/29/2018    9:00 AM 06/23/2018    9:31 AM 03/08/2018    3:26 PM  Depression screen PHQ 2/9  Decreased Interest 0 0 0 0 0  Down, Depressed, Hopeless 0 0 0 0 0  PHQ - 2 Score 0 0 0 0 0  Altered sleeping 0 0 0 0   Tired, decreased energy 0 0 0 0   Change in appetite 0 0 0 0   Feeling bad or failure about yourself  0 0 0 0   Trouble concentrating 0 0 0 0   Moving slowly or fidgety/restless 0 0 0 0   Suicidal thoughts 0 0 0 0   PHQ-9 Score 0 0 0 0   Difficult doing work/chores    Not difficult at all    Hypertension: BP Readings from Last 3 Encounters:  08/28/21 116/72  03/20/21 104/70  03/14/21 114/74   Obesity: Wt Readings from Last 3 Encounters:  08/28/21 180 lb 6.4 oz (81.8 kg)  09/23/20 172 lb (78 kg)  09/02/20 172 lb (78 kg)   BMI Readings from Last 3 Encounters:  08/28/21 33.00 kg/m  09/23/20 31.46 kg/m  09/02/20 31.46 kg/m     Vaccines:   HPV: N/A Tdap: Due, last 05/08/10 Shingrix: N/A Pneumonia: N/A Flu: up to date COVID-19: N/A   Hep C Screening: 08/29/18 STD testing and prevention (HIV/chl/gon/syphilis): N/A Intimate partner violence: negative screen  Sexual History : Menstrual History/LMP/Abnormal Bleeding:  Discussed importance of follow up if any post-menopausal bleeding: {Response; yes/no/na:63}  Incontinence Symptoms: negative for symptoms   Breast cancer:  - Last Mammogram: 12/28/18, ordered today - BRCA gene screening: N/A  Osteoporosis Prevention : Discussed high calcium and vitamin D  supplementation, weight bearing exercises Bone density: N/A   Cervical cancer screening: Due this year, ordered today  Skin cancer: Discussed monitoring for atypical lesions  Colorectal cancer: N/A   Lung cancer:  Low Dose CT Chest recommended if Age 23-80 years, 20 pack-year currently smoking OR have quit w/in 15years. Patient does not qualify for screen   ECG: 03/08/18  Advanced Care Planning: A voluntary discussion about advance care planning including the explanation and discussion of advance directives.  Discussed health care proxy and Living will, and the patient was able to identify a health care proxy as ***.  Patient does not have a living will and power of attorney of health care   Lipids: Lab Results  Component Value Date   CHOL 178 09/02/2020   CHOL 182 08/31/2019   CHOL 200 (H) 08/29/2018   Lab Results  Component Value Date   HDL 57 09/02/2020   HDL 59 08/31/2019   HDL 58 08/29/2018   Lab Results  Component Value Date   LDLCALC 105 (H) 09/02/2020   LDLCALC 107 (H) 08/31/2019   LDLCALC 122 (H) 08/29/2018   Lab Results  Component Value Date   TRIG 74 09/02/2020   TRIG 70 08/31/2019  TRIG 97 08/29/2018   Lab Results  Component Value Date   CHOLHDL 3.1 09/02/2020   CHOLHDL 3.1 08/31/2019   CHOLHDL 3.4 08/29/2018   No results found for: "LDLDIRECT"  Glucose: Glucose, Bld  Date Value Ref Range Status  09/02/2020 97 65 - 99 mg/dL Final    Comment:    .            Fasting reference interval .   08/31/2019 84 65 - 99 mg/dL Final    Comment:    .            Fasting reference interval .   08/29/2018 88 65 - 99 mg/dL Final    Comment:    .            Fasting reference interval .     Patient Active Problem List   Diagnosis Date Noted   Status post left partial knee replacement 12/07/2019   Menopausal symptom 06/17/2017   Irregular periods 06/17/2017   Dysmenorrhea 06/17/2017   Drug allergy 06/17/2017   Vitamin D deficiency 12/04/2016    Plantar fasciitis, left 05/19/2016   Cervical high risk HPV (human papillomavirus) test positive 11/04/2015   Allergic rhinitis 10/10/2014   Back pain, chronic 10/10/2014   Clinical depression 10/10/2014   Dermatitis, eczematoid 10/10/2014   Insomnia 58/85/0277   Umbilical hernia without obstruction and without gangrene 10/10/2014   H/O allergy to multiple drugs 02/22/2012   Blood type, Rh negative 02/22/2012   Previous cesarean delivery x 4 02/22/2012   Personal history of kidney stones 02/22/2012    Past Surgical History:  Procedure Laterality Date   CESAREAN SECTION  2003,2005,2008,2010   FOOT SURGERY     neuroma left foot   meniscal repair Left 11/2018   tummy tuck  41/28/7867   UMBILICAL HERNIA REPAIR  05/31/2014   VENTRAL HERNIA REPAIR  05/31/2014   WISDOM TOOTH EXTRACTION      Family History  Problem Relation Age of Onset   Cancer Mother 56       breast cancer   Osteoarthritis Mother    Broken bones Mother        Hip x2   Varicose Veins Mother    Hyperlipidemia Father    Hypertension Father    Heart attack Father    Hypertension Paternal Grandmother    Congenital heart disease Paternal Grandmother    Stroke Paternal Grandfather    Kidney disease Daughter    Hypercholesterolemia Brother    Colon cancer Maternal Grandmother    Heart disease Maternal Grandfather    Hypertension Sister    Migraines Sister    Thyroid disease Sister    GI problems Sister        Extra kink in her large intestine   Hypercholesterolemia Brother     Social History   Socioeconomic History   Marital status: Married    Spouse name: Not on file   Number of children: 4   Years of education: Not on file   Highest education level: Doctorate  Occupational History   Occupation: Professor    Fish farm manager: [employer]    Comment: [place]  Tobacco Use   Smoking status: Never   Smokeless tobacco: Never  Scientific laboratory technician Use: Never used  Substance and Sexual Activity   Alcohol use: No    Drug use: No   Sexual activity: Yes    Partners: Male    Birth control/protection: Rhythm    Comment: married   Other Topics  Concern   Not on file  Social History Narrative   Married, she has 4 daughters    She works as Development worker, international aid at [employer], and is on Optician, dispensing role and teaching   Social Determinants of Radio broadcast assistant Strain: Low Risk  (09/02/2020)   Overall Financial Resource Strain (CARDIA)    Difficulty of Paying Living Expenses: Not hard at all  Food Insecurity: No [place] (09/02/2020)   Hunger Vital Sign    Worried About Running Out of Food in the Last Year: Never true    [place] in the Last Year: Never true  Transportation Needs: No Transportation Needs (09/02/2020)   PRAPARE - Hydrologist (Medical): No    Lack of Transportation (Non-Medical): No  Physical Activity: Sufficiently Active (09/02/2020)   Exercise Vital Sign    Days of Exercise per Week: 4 days    Minutes of Exercise per Session: 50 min  Stress: No Stress Concern Present (09/02/2020)   [place]    Feeling of Stress : Only a little  Social Connections: Moderately Integrated (09/02/2020)   Social Connection and Isolation Panel [NHANES]    Frequency of Communication with Friends and Family: More than three times a week    Frequency of Social Gatherings with Friends and Family: Once a week    Attends Religious Services: Never    Marine scientist or Organizations: Yes    Attends Music therapist: More than 4 times per year    Marital Status: Married  Human resources officer Violence: Not At Risk (09/02/2020)   Humiliation, Afraid, Rape, and Kick questionnaire    Fear of Current or Ex-Partner: No    Emotionally Abused: No    Physically Abused: No    Sexually Abused: No     Current Outpatient Medications:    celecoxib (CELEBREX) 200 MG capsule, Celebrex 200 mg capsule   Take 1 capsule twice a day by oral route as needed. (Patient not taking: Reported on 08/28/2021), Disp: , Rfl:    clobetasol ointment (TEMOVATE) 3.57 %, APPLY 1 APPLICATION TO SKIN TWICE DAILY, Disp: , Rfl: 4   meloxicam (MOBIC) 15 MG tablet, Take 15 mg by mouth daily. (Patient not taking: Reported on 03/20/2021), Disp: , Rfl:    nystatin (MYCOSTATIN/NYSTOP) powder, Apply 1 Application topically 3 (three) times daily., Disp: 15 g, Rfl: 0   nystatin cream (MYCOSTATIN), Apply 1 Application topically 2 (two) times daily., Disp: 30 g, Rfl: 0  Allergies  Allergen Reactions   Bactrim [Sulfamethoxazole-Trimethoprim] Itching   Erythromycin Other (See Comments)    Severe abdominal pain   Penicillins Hives     ROS  ***  Objective  There were no vitals filed for this visit.  There is no height or weight on file to calculate BMI.  Physical Exam ***  No results found for this or any previous visit (from the past 2160 hour(s)).   Fall Risk:    09/02/2020    8:16 AM 08/31/2019    8:36 AM 08/29/2018    9:00 AM 06/23/2018    9:31 AM 03/08/2018    3:26 PM  Fall Risk   Falls in the past year? 0 0 0 0 0  Number falls in past yr: 0 0 0 0 0  Injury with Fall? 0 0 0 0 0     Functional Status Survey:     Assessment & Plan  1. Well adult exam ***   -USPSTF grade A and B recommendations reviewed with patient; age-appropriate recommendations, preventive care, screening tests, etc discussed and encouraged; healthy living encouraged; see AVS for patient education given to patient -Discussed importance of 150 minutes of physical activity weekly, eat two servings of fish weekly, eat one serving of tree nuts ( cashews, pistachios, pecans, almonds.Marland Kitchen) every other day, eat 6 servings of fruit/vegetables daily and drink plenty of water and avoid sweet beverages.   -Reviewed Health Maintenance: Yes.

## 2021-09-08 ENCOUNTER — Encounter: Payer: BC Managed Care – PPO | Admitting: Family Medicine

## 2021-09-18 ENCOUNTER — Encounter: Payer: Self-pay | Admitting: Nurse Practitioner

## 2021-09-18 ENCOUNTER — Ambulatory Visit (INDEPENDENT_AMBULATORY_CARE_PROVIDER_SITE_OTHER): Payer: Self-pay | Admitting: Nurse Practitioner

## 2021-09-18 VITALS — Temp 98.5°F

## 2021-09-18 DIAGNOSIS — B372 Candidiasis of skin and nail: Secondary | ICD-10-CM

## 2021-09-18 MED ORDER — KETOCONAZOLE 2 % EX SHAM: 1.0000 | MEDICATED_SHAMPOO | CUTANEOUS | 0 refills | Status: DC

## 2021-09-18 MED ORDER — FLUCONAZOLE 150 MG PO TABS: ORAL_TABLET | ORAL | 0 refills | Status: DC

## 2021-09-19 DIAGNOSIS — M76891 Other specified enthesopathies of right lower limb, excluding foot: Secondary | ICD-10-CM | POA: Diagnosis not present

## 2021-09-19 DIAGNOSIS — M25561 Pain in right knee: Secondary | ICD-10-CM | POA: Diagnosis not present

## 2021-09-19 NOTE — Progress Notes (Signed)
[facility]rap[facility] [address] [place], [place] [address] [phone]  Office Visit Note  Patient Name: [name] Date of Birth [id number]  Medical Record number [id number]  Date of Service: 09/19/2021  Chief Complaint  Patient presents with   Rash    Follow up on rash. Not really getting better. Has not moved anywhere else.     HPI 51 year old female returning to clinic with ongoing rash between breasts. She has used Nystatin cream and powder, also had a one time 150mg  diflucan dose.   Rash is no better no worse.    Current Medication:  Outpatient Encounter Medications as of 09/18/2021  Medication Sig   clobetasol ointment (TEMOVATE) 0.05 % APPLY 1 APPLICATION TO SKIN TWICE DAILY   fluconazole (DIFLUCAN) 150 MG tablet Take one tablet on days 1,4 and 7 (72 hours between doses)   ketoconazole (NIZORAL) 2 % shampoo Apply 1 Application topically 2 (two) times a week.   nystatin (MYCOSTATIN/NYSTOP) powder Apply 1 Application topically 3 (three) times daily.   nystatin cream (MYCOSTATIN) Apply 1 Application topically 2 (two) times daily.   No facility-administered encounter medications on file as of 09/18/2021.      Medical History: Past Medical History:  Diagnosis Date   Allergy    Anemia    Childhood   Arthritis    Asthma    exercise induced   Back pain    bulging disc   Back pain, chronic    Cellulitis    Clinical depression    Eczema    Endometritis    Fatigue    Headache    Hernia    Umbilical   History of chicken pox    Insomnia    Irregular menses    Kidney stones    Ovarian cyst    Over weight    Rectus diastasis    Right hip pain    Yeast infection      Vital Signs: Temp 98.5 F (36.9 C) (Tympanic)    Review of Systems  Constitutional: Negative.   HENT: Negative.    Respiratory: Negative.    Musculoskeletal: Negative.   Skin:  Positive for rash.    Physical Exam HENT:     Head: Normocephalic.  Pulmonary:     Effort:  Pulmonary effort is normal.  Skin:    Findings: Rash present.          Comments: Erythematous red oval with shinny appearance    Neurological:     Mental Status: She is alert.       Assessment/Plan: 1. Skin yeast infection Keep skin as dry as possible as discussed   - fluconazole (DIFLUCAN) 150 MG tablet; Take one tablet on days 1,4 and 7 (72 hours between doses)  Dispense: 3 tablet; Refill: 0 - ketoconazole (NIZORAL) 2 % shampoo; Apply 1 Application topically 2 (two) times a week.  Dispense: 120 mL; Refill: 0    General Counseling: [name] verbalizes understanding of the findings of todays visit and agrees with plan of treatment. I have discussed any further diagnostic evaluation that may be needed or ordered today. We also reviewed her medications today. she has been encouraged to call the office with any questions or concerns that should arise related to todays visit.   No orders of the defined types were placed in this encounter.   Meds ordered this encounter  Medications   fluconazole (DIFLUCAN) 150 MG tablet    Sig: Take one tablet on days 1,4  and 7 (72 hours between doses)    Dispense:  3 tablet    Refill:  0   ketoconazole (NIZORAL) 2 % shampoo    Sig: Apply 1 Application topically 2 (two) times a week.    Dispense:  120 mL    Refill:  0    Time spent:15 Minutes   Viviano Simas [facility] Family Nurse Practitioner

## 2021-10-09 DIAGNOSIS — L218 Other seborrheic dermatitis: Secondary | ICD-10-CM | POA: Diagnosis not present

## 2021-10-09 DIAGNOSIS — L304 Erythema intertrigo: Secondary | ICD-10-CM | POA: Diagnosis not present

## 2021-10-24 DIAGNOSIS — M2391 Unspecified internal derangement of right knee: Secondary | ICD-10-CM | POA: Diagnosis not present

## 2021-10-29 DIAGNOSIS — L570 Actinic keratosis: Secondary | ICD-10-CM | POA: Diagnosis not present

## 2021-10-29 DIAGNOSIS — D2261 Melanocytic nevi of right upper limb, including shoulder: Secondary | ICD-10-CM | POA: Diagnosis not present

## 2021-10-29 DIAGNOSIS — L218 Other seborrheic dermatitis: Secondary | ICD-10-CM | POA: Diagnosis not present

## 2021-10-29 DIAGNOSIS — L304 Erythema intertrigo: Secondary | ICD-10-CM | POA: Diagnosis not present

## 2021-10-29 DIAGNOSIS — L821 Other seborrheic keratosis: Secondary | ICD-10-CM | POA: Diagnosis not present

## 2021-11-21 ENCOUNTER — Encounter: Payer: Self-pay | Admitting: Nurse Practitioner

## 2021-11-21 ENCOUNTER — Ambulatory Visit (INDEPENDENT_AMBULATORY_CARE_PROVIDER_SITE_OTHER): Payer: Self-pay | Admitting: Nurse Practitioner

## 2021-11-21 VITALS — BP 120/72 | HR 86 | Temp 98.3°F | Ht 62.0 in | Wt 178.8 lb

## 2021-11-21 DIAGNOSIS — J029 Acute pharyngitis, unspecified: Secondary | ICD-10-CM

## 2021-11-21 DIAGNOSIS — J069 Acute upper respiratory infection, unspecified: Secondary | ICD-10-CM

## 2021-11-21 LAB — POCT INFLUENZA A/B
Influenza A, POC: NEGATIVE
Influenza B, POC: NEGATIVE

## 2021-11-21 LAB — POCT RAPID STREP A (OFFICE): Rapid Strep A Screen: NEGATIVE

## 2021-11-21 LAB — POC COVID19 BINAXNOW: SARS Coronavirus 2 Ag: NEGATIVE

## 2021-11-21 NOTE — Progress Notes (Signed)
[facility] [address] [place], [place] [address] [phone]  Office Visit Note  Patient Name: [name] Date of Birth [id number]  Medical Record number [id number]  Date of Service: 11/21/2021  Chief Complaint  Patient presents with   Sore Throat    Body achy's, ST, fever, congested. Took Tylenol this morning. Started a few days ago but got worse last night. Has not taken a COVID test.      HPI  51 year old female presenting with headache sore throat. Fever/chills started last night.   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 (Patient not taking: Reported on 11/21/2021)   nystatin (MYCOSTATIN/NYSTOP) powder Apply 1 Application topically 3 (three) times daily. (Patient not taking: Reported on 11/21/2021)   nystatin cream (MYCOSTATIN) Apply 1 Application topically 2 (two) times daily. (Patient not taking: Reported on 11/21/2021)   No facility-administered encounter medications on file as of 11/21/2021.      Medical History: Past Medical History:  Diagnosis Date   Allergy    Anemia    Childhood   Arthritis    Asthma    exercise induced   Back pain    bulging disc   Back pain, chronic    Cellulitis    Clinical depression    Eczema    Endometritis    Fatigue    Headache    Hernia    Umbilical   History of chicken pox    Insomnia    Irregular menses    Kidney stones     Ovarian cyst    Over weight    Rectus diastasis    Right hip pain    Yeast infection      Vital Signs: BP 120/72 (BP Location: Left Arm, Patient Position: Sitting, Cuff Size: Normal)   Pulse 86   Temp 98.3 F (36.8 C) (Tympanic)   Ht 5\' 2"  (1.575 m)   Wt 178 lb 12.8 oz (81.1 kg)   SpO2 97%   BMI 32.70 kg/m    Review of Systems  Constitutional:  Positive for chills and fatigue.  HENT:  Positive for rhinorrhea and sore throat.   Musculoskeletal:  Positive for myalgias.    Physical Exam HENT:     Head: Normocephalic.     Nose: Rhinorrhea present.     Mouth/Throat:     Mouth: Mucous membranes are moist.     Pharynx: Posterior oropharyngeal erythema present.  Eyes:     Pupils: Pupils are equal, round, and reactive to light.  Cardiovascular:     Rate and Rhythm: Normal rate.  Pulmonary:     Effort: Pulmonary effort is normal.     Breath sounds: Normal breath sounds.  Musculoskeletal:     Cervical back: Normal range of motion.  Skin:    General: Skin is warm.  Neurological:     General: No focal  deficit present.     Mental Status: She is alert.  Psychiatric:        Mood and Affect: Mood normal.     Recent Results (from the past 2160 hour(s))  POC COVID-19     Status: Normal   Collection Time: 11/21/21  9:06 AM  Result Value Ref Range   SARS Coronavirus 2 Ag Negative Negative  POCT rapid strep A     Status: Normal   Collection Time: 11/21/21  9:06 AM  Result Value Ref Range   Rapid Strep A Screen Negative Negative  POCT Influenza A/B     Status: Normal   Collection Time: 11/21/21  9:07 AM  Result Value Ref Range   Influenza A, POC Negative Negative   Influenza B, POC Negative Negative     Assessment/Plan: 1. Sore throat Cepacol lozenges   - POCT rapid strep A  2. Viral upper respiratory tract infection Alternate tylenol up to 3,000mg  daily and ibuprofen up to 2400mg  daily total   Add OTC decongestant as needed  RTC if symptoms persist or with new  concerns   - POC COVID-19 - POCT Influenza A/B     General Counseling: Berdell verbalizes understanding of the findings of todays visit and agrees with plan of treatment. I have discussed any further diagnostic evaluation that may be needed or ordered today. We also reviewed her medications today. she has been encouraged to call the office with any questions or concerns that should arise related to todays visit.    Time spent:15 [place] [facility] Family Nurse Practitioner

## 2021-11-23 ENCOUNTER — Other Ambulatory Visit: Payer: Self-pay

## 2021-11-23 ENCOUNTER — Emergency Department: Payer: BC Managed Care – PPO

## 2021-11-23 ENCOUNTER — Emergency Department
Admission: EM | Admit: 2021-11-23 | Discharge: 2021-11-23 | Disposition: A | Discharge status: Home / Self Care | Payer: BC Managed Care – PPO | Attending: Emergency Medicine | Admitting: Emergency Medicine

## 2021-11-23 DIAGNOSIS — M5127 Other intervertebral disc displacement, lumbosacral region: Secondary | ICD-10-CM | POA: Diagnosis not present

## 2021-11-23 DIAGNOSIS — R197 Diarrhea, unspecified: Secondary | ICD-10-CM | POA: Diagnosis not present

## 2021-11-23 DIAGNOSIS — Z7952 Long term (current) use of systemic steroids: Secondary | ICD-10-CM | POA: Diagnosis not present

## 2021-11-23 DIAGNOSIS — J45909 Unspecified asthma, uncomplicated: Secondary | ICD-10-CM | POA: Insufficient documentation

## 2021-11-23 DIAGNOSIS — B974 Respiratory syncytial virus as the cause of diseases classified elsewhere: Secondary | ICD-10-CM | POA: Insufficient documentation

## 2021-11-23 DIAGNOSIS — Z20822 Contact with and (suspected) exposure to covid-19: Secondary | ICD-10-CM | POA: Diagnosis not present

## 2021-11-23 DIAGNOSIS — R109 Unspecified abdominal pain: Secondary | ICD-10-CM | POA: Diagnosis not present

## 2021-11-23 DIAGNOSIS — N200 Calculus of kidney: Secondary | ICD-10-CM | POA: Insufficient documentation

## 2021-11-23 DIAGNOSIS — B338 Other specified viral diseases: Secondary | ICD-10-CM

## 2021-11-23 DIAGNOSIS — K449 Diaphragmatic hernia without obstruction or gangrene: Secondary | ICD-10-CM | POA: Diagnosis not present

## 2021-11-23 DIAGNOSIS — N132 Hydronephrosis with renal and ureteral calculous obstruction: Secondary | ICD-10-CM | POA: Diagnosis not present

## 2021-11-23 DIAGNOSIS — Z8619 Personal history of other infectious and parasitic diseases: Secondary | ICD-10-CM

## 2021-11-23 HISTORY — DX: Personal history of other infectious and parasitic diseases: Z86.19

## 2021-11-23 LAB — RESP PANEL BY RT-PCR (FLU A&B, COVID) ARPGX2
Influenza A by PCR: NEGATIVE
Influenza B by PCR: NEGATIVE
SARS Coronavirus 2 by RT PCR: NEGATIVE

## 2021-11-23 LAB — CBC
HCT: 44.1 % (ref 36.0–46.0)
Hemoglobin: 14.9 g/dL (ref 12.0–15.0)
MCH: 28.8 pg (ref 26.0–34.0)
MCHC: 33.8 g/dL (ref 30.0–36.0)
MCV: 85.3 fL (ref 80.0–100.0)
Platelets: 245 10*3/uL (ref 150–400)
RBC: 5.17 MIL/uL — ABNORMAL HIGH (ref 3.87–5.11)
RDW: 12.5 % (ref 11.5–15.5)
WBC: 7.3 10*3/uL (ref 4.0–10.5)
nRBC: 0 % (ref 0.0–0.2)

## 2021-11-23 LAB — BASIC METABOLIC PANEL
Anion gap: 11 (ref 5–15)
BUN: 21 mg/dL — ABNORMAL HIGH (ref 6–20)
CO2: 21 mmol/L — ABNORMAL LOW (ref 22–32)
Calcium: 9.8 mg/dL (ref 8.9–10.3)
Chloride: 107 mmol/L (ref 98–111)
Creatinine, Ser: 0.92 mg/dL (ref 0.44–1.00)
GFR, Estimated: >60 mL/min (ref >60)
Glucose, Bld: 120 mg/dL — ABNORMAL HIGH (ref 70–99)
Potassium: 4.1 mmol/L (ref 3.5–5.1)
Sodium: 139 mmol/L (ref 135–145)

## 2021-11-23 LAB — GROUP A STREP BY PCR: Group A Strep by PCR: NOT DETECTED

## 2021-11-23 MED ORDER — ALBUTEROL SULFATE HFA 108 (90 BASE) MCG/ACT IN AERS: 2.0000 | INHALATION_SPRAY | Freq: Four times a day (QID) | RESPIRATORY_TRACT | 2 refills | Status: DC | PRN

## 2021-11-23 MED ORDER — ONDANSETRON HCL 4 MG/2ML IJ SOLN
4.0000 mg | Freq: Once | INTRAMUSCULAR | Status: AC
  Administered 2021-11-23: 4 mg via INTRAVENOUS
  Filled 2021-11-23: qty 2

## 2021-11-23 MED ORDER — PROMETHAZINE-DM 6.25-15 MG/5ML PO SYRP: 5.0000 mL | ORAL_SOLUTION | Freq: Four times a day (QID) | ORAL | 0 refills | Status: DC | PRN

## 2021-11-23 MED ORDER — FENTANYL CITRATE PF 50 MCG/ML IJ SOSY
50.0000 ug | PREFILLED_SYRINGE | INTRAMUSCULAR | Status: DC | PRN
  Administered 2021-11-23: 50 ug via INTRAVENOUS
  Filled 2021-11-23 (×2): qty 1

## 2021-11-23 MED ORDER — PREDNISONE 10 MG PO TABS: ORAL_TABLET | ORAL | 0 refills | Status: DC

## 2021-11-23 MED ORDER — HYDROMORPHONE HCL 1 MG/ML IJ SOLN
0.5000 mg | Freq: Once | INTRAMUSCULAR | Status: AC
  Administered 2021-11-23: 0.5 mg via INTRAVENOUS
  Filled 2021-11-23: qty 0.5

## 2021-11-23 MED ORDER — TAMSULOSIN HCL 0.4 MG PO CAPS: 0.4000 mg | ORAL_CAPSULE | Freq: Every day | ORAL | 0 refills | Status: DC

## 2021-11-23 MED ORDER — HYDROCODONE-ACETAMINOPHEN 5-325 MG PO TABS: 1.0000 | ORAL_TABLET | Freq: Four times a day (QID) | ORAL | 0 refills | Status: DC | PRN

## 2021-11-23 NOTE — ED Notes (Signed)
IV removed 22 g right ac, catheter intact

## 2021-11-23 NOTE — ED Triage Notes (Signed)
Pt to ED for fever , cough and sore throat since Friday (99.3), vomiting and diarrhea and L flank pain since this morning. Took 1000mg  tylenol at 0630 today. Saw PCP on Friday, negative covid and flu tests done then (2d ago).  Pt appears extremely uncomfortable and cannot sit still.  Vomited 3 times on way here. Hx renal stones.

## 2021-11-23 NOTE — Discharge Instructions (Signed)
You were seen today for URI symptoms and left flank pain.  You tested positive for RSV.  We are treating your symptoms with a prednisone taper and cough syrup.  You have a 5 mm left kidney stone.  I am treating this with Flomax and pain medication.  Please increase your fluid intake.  Please call urology for follow-up for further evaluation.

## 2021-11-23 NOTE — ED Provider Notes (Signed)
[facility] EMERGENCY DEPARTMENT Provider Note   CSN: [id number] Arrival date & time: 11/23/21  1108     History  Chief Complaint  Patient presents with   Flank Pain   Emesis   Diarrhea    [name] is a 51 y.o. female with a history of chronic back pain and kidney stones presents to the ER today with complaint of fever, sore throat and cough.  She reports this started 3 days ago.  She has been having difficulty swallowing.  The cough is mostly nonproductive.  She was seen at her PCP on Friday for the same.  Strep, flu and COVID test were negative at that time.  She repeated a home COVID test which was also negative.  She reports they diagnosed her with a viral syndrome and did not give her any medication to treat this.  Subsequently, she developed left flank pain, vomiting and diarrhea.  She reports this started this morning.  She describes left flank pain as sharp and stabbing and radiating around to the left upper abdomen.  She reports she has had kidney stones in the past but never this severe.  She denies urinary urgency, frequency, dysuria or blood in her urine.  She denies urinary symptoms.  She has had fever but denies chills or body aches.  She has tried exercising Tylenol, Ibuprofen, Decongestant and Albuterol with minimal relief of symptoms.     Home Medications Prior to Admission medications   Medication Sig Start Date End Date Taking? Authorizing Provider  HYDROcodone-acetaminophen (NORCO/VICODIN) 5-325 MG tablet Take 1 tablet by mouth every 6 (six) hours as needed for moderate pain. 11/23/21 11/23/22 Yes Yeva Bissette, Salvadore Oxford, NP  predniSONE (DELTASONE) 10 MG tablet Take 6 tabs on day 1, 5 tabs on day 2, 4 tabs on day 3, 3 tabs on day 4, 2 tabs on day 5, 1 tab on day 6 11/23/21  Yes Talis Iwan, Salvadore Oxford, NP  promethazine-dextromethorphan (PROMETHAZINE-DM) 6.25-15 MG/5ML syrup Take 5 mLs by mouth 4 (four) times daily as needed for cough. 11/23/21  Yes Lorre Munroe, NP  tamsulosin (FLOMAX) 0.4 MG CAPS capsule Take 1 capsule (0.4 mg total) by mouth daily. 11/23/21  Yes Demeka Sutter, Salvadore Oxford, NP  clobetasol ointment (TEMOVATE) 0.05 % APPLY 1 APPLICATION TO SKIN TWICE DAILY Patient not taking: Reported on 11/21/2021 09/11/14   [provider]  fluconazole (DIFLUCAN) 150 MG tablet Take one tablet on days 1,4 and 7 (72 hours between doses) Patient not taking: Reported on 11/21/2021 09/18/21   Viviano Simas, FNP  ketoconazole (NIZORAL) 2 % shampoo Apply 1 Application topically 2 (two) times a week. Patient not taking: Reported on 11/21/2021 09/18/21   Viviano Simas, FNP  nystatin (MYCOSTATIN/NYSTOP) powder Apply 1 Application topically 3 (three) times daily. Patient not taking: Reported on 11/21/2021 09/03/21   Viviano Simas, FNP  nystatin cream (MYCOSTATIN) Apply 1 Application topically 2 (two) times daily. Patient not taking: Reported on 11/21/2021 08/28/21   Viviano Simas, FNP      Allergies    Bactrim [sulfamethoxazole-trimethoprim], Celebrex [celecoxib], Erythromycin, and Penicillins    Review of Systems   Review of Systems   Past Medical History:  Diagnosis Date   Allergy    Anemia    Childhood   Arthritis    Asthma    exercise induced   Back pain    bulging disc   Back pain, chronic    Cellulitis    Clinical depression    Eczema  Endometritis    Fatigue    Headache    Hernia    Umbilical   History of chicken pox    Insomnia    Irregular menses    Kidney stones    Ovarian cyst    Over weight    Rectus diastasis    Right hip pain    Yeast infection     Current Facility-Administered Medications  Medication Dose Route Frequency Provider Last Rate Last Admin   fentaNYL (SUBLIMAZE) injection 50 mcg  50 mcg Intravenous Q30 min PRN Blake Divine, MD   50 mcg at 11/23/21 1130   Current Outpatient Medications  Medication Sig Dispense Refill   HYDROcodone-acetaminophen (NORCO/VICODIN) 5-325 MG tablet Take 1 tablet by mouth  every 6 (six) hours as needed for moderate pain. 20 tablet 0   predniSONE (DELTASONE) 10 MG tablet Take 6 tabs on day 1, 5 tabs on day 2, 4 tabs on day 3, 3 tabs on day 4, 2 tabs on day 5, 1 tab on day 6 21 tablet 0   promethazine-dextromethorphan (PROMETHAZINE-DM) 6.25-15 MG/5ML syrup Take 5 mLs by mouth 4 (four) times daily as needed for cough. 118 mL 0   tamsulosin (FLOMAX) 0.4 MG CAPS capsule Take 1 capsule (0.4 mg total) by mouth daily. 14 capsule 0   clobetasol ointment (TEMOVATE) 0.16 % APPLY 1 APPLICATION TO SKIN TWICE DAILY (Patient not taking: Reported on 11/21/2021)  4   fluconazole (DIFLUCAN) 150 MG tablet Take one tablet on days 1,4 and 7 (72 hours between doses) (Patient not taking: Reported on 11/21/2021) 3 tablet 0   ketoconazole (NIZORAL) 2 % shampoo Apply 1 Application topically 2 (two) times a week. (Patient not taking: Reported on 11/21/2021) 120 mL 0   nystatin (MYCOSTATIN/NYSTOP) powder Apply 1 Application topically 3 (three) times daily. (Patient not taking: Reported on 11/21/2021) 15 g 0   nystatin cream (MYCOSTATIN) Apply 1 Application topically 2 (two) times daily. (Patient not taking: Reported on 11/21/2021) 30 g 0    Allergies  Allergen Reactions   Bactrim [Sulfamethoxazole-Trimethoprim] Itching   Celebrex [Celecoxib] Hives   Erythromycin Other (See Comments)    Severe abdominal pain   Penicillins Hives    Family History  Problem Relation Age of Onset   Cancer Mother 66       breast cancer   Osteoarthritis Mother    Broken bones Mother        Hip x2   Varicose Veins Mother    Hyperlipidemia Father    Hypertension Father    Heart attack Father    Hypertension Paternal Grandmother    Congenital heart disease Paternal Grandmother    Stroke Paternal Grandfather    Kidney disease Daughter    Hypercholesterolemia Brother    Colon cancer Maternal Grandmother    Heart disease Maternal Grandfather    Hypertension Sister    Migraines Sister    Thyroid disease  Sister    GI problems Sister        Extra kink in her large intestine   Hypercholesterolemia Brother     Social History   Socioeconomic History   Marital status: Married    Spouse name: Not on file   Number of children: 4   Years of education: Not on file   Highest education level: Doctorate  Occupational History   Occupation: Professor    Employer: [employer]    Comment:   Tobacco Use   Smoking status: Never   Smokeless tobacco: Never  Media planner  Vaping Use: Never used  Substance and Sexual Activity   Alcohol use: No   Drug use: No   Sexual activity: Yes    Partners: Male    Birth control/protection: Rhythm    Comment: married   Other Topics Concern   Not on file  Social History Narrative   Married, she has 4 daughters    She works as Network engineer at [employer], and is on Research scientist (medical) role and teaching   Social Determinants of Corporate investment banker Strain: Low Risk  (09/02/2020)   Overall Financial Resource Strain (CARDIA)    Difficulty of Paying Living Expenses: Not hard at all  Food Insecurity: No Food Insecurity (09/02/2020)   Hunger Vital Sign    Worried About Running Out of Food in the Last Year: Never true    Ran Out of Food in the Last Year: Never true  Transportation Needs: No Transportation Needs (09/02/2020)   PRAPARE - Administrator, Civil Service (Medical): No    Lack of Transportation (Non-Medical): No  Physical Activity: Sufficiently Active (09/02/2020)   Exercise Vital Sign    Days of Exercise per Week: 4 days    Minutes of Exercise per Session: 50 min  Stress: No Stress Concern Present (09/02/2020)   [employer] of Occupational Health - Occupational Stress Questionnaire    Feeling of Stress : Only a little  Social Connections: Moderately Integrated (09/02/2020)   Social Connection and Isolation Panel [NHANES]    Frequency of Communication with Friends and Family: More than three times a week    Frequency of Social  Gatherings with Friends and Family: Once a week    Attends Religious Services: Never    Database administrator or Organizations: Yes    Attends Engineer, structural: More than 4 times per year    Marital Status: Married  Catering manager Violence: Not At Risk (09/02/2020)   Humiliation, Afraid, Rape, and Kick questionnaire    Fear of Current or Ex-Partner: No    Emotionally Abused: No    Physically Abused: No    Sexually Abused: No     Constitutional: Patient reports fever.  Denies malaise, fatigue, headache or abrupt weight changes.  HEENT: Patient reports sore throat.  Denies eye pain, eye redness, ear pain, ringing in the ears, wax buildup, runny nose, nasal congestion, bloody nose. Respiratory: Patient reports cough.  Denies difficulty breathing, shortness of breath,  or sputum production.   Cardiovascular: Denies chest pain, chest tightness, palpitations or swelling in the hands or feet.  Gastrointestinal: Patient reports flank pain, vomiting and diarrhea.  Denies  bloating, constipation, or blood in the stool.  GU: Denies urgency, frequency, pain with urination, burning sensation, blood in urine, odor or discharge. Musculoskeletal: Denies decrease in range of motion, difficulty with gait, muscle pain or joint pain and swelling.  Skin: Denies redness, rashes, lesions or ulcercations.  .  No other specific complaints in a complete review of systems (except as listed in HPI above).  Physical Exam Updated Vital Signs BP (!) 141/89   Pulse 65   Temp 98.8 F (37.1 C) (Oral) Comment: took tylenol at 0630, 1000mg   Resp (!) 22   Ht 5\' 2"  (1.575 m)   Wt 81 kg   SpO2 97%   BMI 32.66 kg/m  Physical Exam  Wt Readings from Last 3 Encounters:  11/23/21 81 kg  11/21/21 81.1 kg  08/28/21 81.8 kg    General: Appears her stated age,  in pain but in NAD. Skin: Warm, dry and intact. No rashes noted. HEENT: Head: normal shape and size, no sinus tenderness noted; Eyes: sclera  white, no icterus, conjunctiva pink, PERRLA and EOMs intact; Nose: mucosa pink and moist, septum midline; Throat/Mouth: Teeth present, mucosa erythematous and moist, no exudate, lesions or ulcerations noted.  Neck: No adenopathy noted. Cardiovascular: Normal rate and rhythm. S1,S2 noted.  No murmur, rubs or gallops noted.  Pulmonary/Chest: Normal effort and positive vesicular breath sounds. No respiratory distress. No wheezes, rales or ronchi noted.  Abdomen: Soft and tender in the LUQ. Normal bowel sounds. No distention or masses noted.  + CVA tenderness noted on the left  Neurological: Alert and oriented.   ED Results / Procedures / Treatments   Labs  Labs Reviewed  BASIC METABOLIC PANEL - Abnormal; Notable for the following components:      Result Value   CO2 21 (*)    Glucose, Bld 120 (*)    BUN 21 (*)    All other components within normal limits  CBC - Abnormal; Notable for the following components:   RBC 5.17 (*)    All other components within normal limits  RESP PANEL BY RT-PCR (FLU A&B, COVID) ARPGX2  GROUP A STREP BY PCR    Radiology  Imaging Orders         CT Renal Stone Study    IMPRESSION: 1. Mild left hydroureteronephrosis to a 5 mm stone in the distal ureter near the ureterovesicular junction. 2. Additional punctate nonobstructive left renal stones measure up to 1-2 mm. 3. Mild decreased hepatic attenuation may reflect hepatic steatosis. 4. Small hiatal hernia.  Procedures   Medications Ordered in ED Medications  fentaNYL (SUBLIMAZE) injection 50 mcg (50 mcg Intravenous Given 11/23/21 1130)  ondansetron (ZOFRAN) injection 4 mg (4 mg Intravenous Given 11/23/21 1130)  HYDROmorphone (DILAUDID) injection 0.5 mg (0.5 mg Intravenous Given 11/23/21 1226)    ED Course/ Medical Decision Making/ A&P    Fever, Sore Throat, Cough, Left Flank Pain, Vomiting and Diarrhea:  DDx include influenza, COVID, RSV, viral URI with cough, viral pharyngitis, bacterial pharyngitis,  kidney stone, acute pyelonephritis, UTI  CBC without evidence of leukocytosis or anemia BMET does not show any evidence of kidney impairment or electrolyte imbalance Strep test negative COVID/RSV/FLU swab positive for RSV CT renal stone study shows a stone in the left ureter per my read, confirmed by radiology Fentanyl 50 mcg did not provide any relief Zofran 4 mg IV x1 Dilaudid 0.5 mg IV x1 with significant pain relief Discussed findings with patient and husband, will treat RSV symptomatically with Pred taper x6 days and Promethazine DM cough syrup. We will treat kidney stone with Flomax 0.4 mg daily, increase fluids and Hydrocodone-Acetaminophen 5-325 mg p.o. every 6 hours We will have her follow-up with urology as an outpatient   Final Clinical Impression(s) / ED Diagnoses Final diagnoses:  RSV (respiratory syncytial virus infection)  Kidney stone    Rx / DC Orders ED Discharge Orders          Ordered    tamsulosin (FLOMAX) 0.4 MG CAPS capsule  Daily        11/23/21 1355    HYDROcodone-acetaminophen (NORCO/VICODIN) 5-325 MG tablet  Every 6 hours PRN        11/23/21 1355    promethazine-dextromethorphan (PROMETHAZINE-DM) 6.25-15 MG/5ML syrup  4 times daily PRN        11/23/21 1355    predniSONE (DELTASONE) 10 MG tablet  11/23/21 1355              Lorre MunroeBaity, Nathan Moctezuma W, NP 11/23/21 1358    Chesley NoonJessup, Charles, MD 11/23/21 2002

## 2021-11-26 ENCOUNTER — Encounter: Payer: Self-pay | Admitting: Urology

## 2021-11-26 ENCOUNTER — Other Ambulatory Visit: Payer: Self-pay | Admitting: Urology

## 2021-11-26 ENCOUNTER — Ambulatory Visit: Payer: BC Managed Care – PPO | Admitting: Urology

## 2021-11-26 VITALS — BP 119/77 | HR 79 | Ht 62.0 in | Wt 176.0 lb

## 2021-11-26 DIAGNOSIS — N132 Hydronephrosis with renal and ureteral calculous obstruction: Secondary | ICD-10-CM | POA: Diagnosis not present

## 2021-11-26 DIAGNOSIS — N201 Calculus of ureter: Secondary | ICD-10-CM

## 2021-11-26 DIAGNOSIS — N23 Unspecified renal colic: Secondary | ICD-10-CM

## 2021-11-26 LAB — URINALYSIS, COMPLETE
Bilirubin, UA: NEGATIVE
Glucose, UA: NEGATIVE
Ketones, UA: NEGATIVE
Leukocytes,UA: NEGATIVE
Nitrite, UA: NEGATIVE
Specific Gravity, UA: 1.03 (ref 1.005–1.030)
Urobilinogen, Ur: 0.2 mg/dL (ref 0.2–1.0)
pH, UA: 5.5 (ref 5.0–7.5)

## 2021-11-26 LAB — MICROSCOPIC EXAMINATION

## 2021-11-26 NOTE — H&P (View-Only) (Signed)
 11/26/2021 8:51 AM   [name] 06/16/1970 [id number]  Referring provider: Sowles, Krichna, MD [address] [place],  [place] [address]  Chief Complaint  Patient presents with   Nephrolithiasis    HPI: [name] is a 51 y.o. female who presents in follow-up of a recent ED visit.  ED visit 11/23/2021 with complaints of left flank pain which started the morning of her ED visit.  Pain was described as severe with radiation to the left upper abdomen Vomiting and diarrhea but no fever, chills or voiding symptoms Stone protocol CT showed a 5 mm left distal ureteral calculus with mild hydronephrosis/hydroureter.  Punctate nonobstructive renal calculi noted in the left kidney Prior history of recurrent stone disease.  Previous intervention with ureteroscopy/basket extraction ~ 20 years ago.  She has passed at least 3 stones the last in June 2021 She has had pelvic burning sensation and mild dysuria.  Has not required oral narcotic analgesic since discharge from the ED.  She is taking tamsulosin. Was also diagnosed with RSV which is improving   PMH: Past Medical History:  Diagnosis Date   Allergy    Anemia    Childhood   Arthritis    Asthma    exercise induced   Back pain    bulging disc   Back pain, chronic    Cellulitis    Clinical depression    Eczema    Endometritis    Fatigue    Headache    Hernia    Umbilical   History of chicken pox    Insomnia    Irregular menses    Kidney stones    Ovarian cyst    Over weight    Rectus diastasis    Right hip pain    Yeast infection     Surgical History: Past Surgical History:  Procedure Laterality Date   CESAREAN SECTION  2003,2005,2008,2010   FOOT SURGERY     neuroma left foot   meniscal repair Left 11/2018   tummy tuck  05/31/2014   UMBILICAL HERNIA REPAIR  05/31/2014   VENTRAL HERNIA REPAIR  05/31/2014   WISDOM TOOTH EXTRACTION      Home Medications:  Allergies as of 11/26/2021        Reactions   Bactrim [sulfamethoxazole-trimethoprim] Itching   Celebrex [celecoxib] Hives   Erythromycin Other (See Comments)   Severe abdominal pain   Penicillins Hives        Medication List        Accurate as of November 26, 2021  8:51 AM. If you have any questions, ask your nurse or doctor.          STOP taking these medications    fluconazole 150 MG tablet Commonly known as: DIFLUCAN Stopped by: Duey Liller C Foch Rosenwald, MD   ketoconazole 2 % shampoo Commonly known as: NIZORAL Stopped by: Alistair Senft C Charice Zuno, MD   nystatin cream Commonly known as: MYCOSTATIN Stopped by: Noelia Lenart C Binta Statzer, MD   nystatin powder Commonly known as: MYCOSTATIN/NYSTOP Stopped by: Shaunak Kreis C Meyer Arora, MD       TAKE these medications    albuterol 108 (90 Base) MCG/ACT inhaler Commonly known as: VENTOLIN HFA Inhale 2 puffs into the lungs every 6 (six) hours as needed for wheezing or shortness of breath.   clobetasol ointment 0.05 % Commonly known as: TEMOVATE   HYDROcodone-acetaminophen 5-325 MG tablet Commonly known as: NORCO/VICODIN Take 1 tablet by mouth every 6 (six) hours as needed for moderate pain.     predniSONE 10 MG tablet Commonly known as: DELTASONE Take 6 tabs on day 1, 5 tabs on day 2, 4 tabs on day 3, 3 tabs on day 4, 2 tabs on day 5, 1 tab on day 6   promethazine-dextromethorphan 6.25-15 MG/5ML syrup Commonly known as: PROMETHAZINE-DM Take 5 mLs by mouth 4 (four) times daily as needed for cough.   tamsulosin 0.4 MG Caps capsule Commonly known as: FLOMAX Take 1 capsule (0.4 mg total) by mouth daily.        Allergies:  Allergies  Allergen Reactions   Bactrim [Sulfamethoxazole-Trimethoprim] Itching   Celebrex [Celecoxib] Hives   Erythromycin Other (See Comments)    Severe abdominal pain   Penicillins Hives    Family History: Family History  Problem Relation Age of Onset   Cancer Mother 70       breast cancer   Osteoarthritis Mother    Broken bones Mother         Hip x2   Varicose Veins Mother    Hyperlipidemia Father    Hypertension Father    Heart attack Father    Hypertension Paternal Grandmother    Congenital heart disease Paternal Grandmother    Stroke Paternal Grandfather    Kidney disease Daughter    Hypercholesterolemia Brother    Colon cancer Maternal Grandmother    Heart disease Maternal Grandfather    Hypertension Sister    Migraines Sister    Thyroid disease Sister    GI problems Sister        Extra kink in her large intestine   Hypercholesterolemia Brother     Social History:  reports that she has never smoked. She has never used smokeless tobacco. She reports that she does not drink alcohol and does not use drugs.   Physical Exam: BP 119/77   Pulse 79   Ht 5' 2" (1.575 m)   Wt 176 lb (79.8 kg)   BMI 32.19 kg/m   Constitutional:  Alert and oriented, No acute distress. HEENT: [place] AT Respiratory: Normal respiratory effort, no increased work of breathing. Psychiatric: Normal mood and affect.  Laboratory Data:  Urinalysis Dipstick 2+ blood/1+ leukocytes Microscopy 11-30 WBC/6-10 RBC   Pertinent Imaging: CT images were personally reviewed and interpreted  CT Renal Stone Study  Narrative CLINICAL DATA:  Left flank pain since this a.m.  EXAM: CT ABDOMEN AND PELVIS WITHOUT CONTRAST  TECHNIQUE: Multidetector CT imaging of the abdomen and pelvis was performed following the standard protocol without IV contrast.  RADIATION DOSE REDUCTION: This exam was performed according to the departmental dose-optimization program which includes automated exposure control, adjustment of the mA and/or kV according to patient size and/or use of iterative reconstruction technique.  COMPARISON:  CT abdomen pelvis October 08, 2008  FINDINGS: Lower chest: Bibasilar atelectasis/scarring.  Small hiatal hernia.  Hepatobiliary: Mild decreased hepatic attenuation may reflect hepatic steatosis. Gallbladder is unremarkable. No  biliary ductal dilation.  Pancreas: No pancreatic ductal dilation or evidence of acute inflammation.  Spleen: No splenomegaly or focal splenic lesion.  Adrenals/Urinary Tract: Bilateral adrenal glands appear normal.  Mild left hydroureteronephrosis to a 5 mm stone in the distal ureter near the ureterovesicular junction. Additional punctate nonobstructive left renal stones measure up to 1-2 mm. Urinary bladder is minimally distended limiting evaluation.  Stomach/Bowel: Stomach is minimally distended limiting evaluation. No pathologic dilation of small or large bowel. Normal appendix. No evidence of acute bowel inflammation.  Vascular/Lymphatic: Normal caliber abdominal aorta. No pathologically enlarged abdominal or pelvic lymph nodes.    Reproductive: Uterus and bilateral adnexa are unremarkable.  Other: No significant abdominopelvic free fluid.  Musculoskeletal: L5-S1 discogenic disease.  IMPRESSION: 1. Mild left hydroureteronephrosis to a 5 mm stone in the distal ureter near the ureterovesicular junction. 2. Additional punctate nonobstructive left renal stones measure up to 1-2 mm. 3. Mild decreased hepatic attenuation may reflect hepatic steatosis. 4. Small hiatal hernia.   Electronically Signed By: Jeffrey  Waltz M.D. On: 11/23/2021 13:35   Assessment & Plan:    1.  Left distal ureteral calculus We discussed various treatment options for urolithiasis including observation with or without medical expulsive therapy, shockwave lithotripsy (SWL), ureteroscopy and laser lithotripsy with stent placement. We discussed that management is based on stone size, location, density, patient co-morbidities, and patient preference.  Stones <5mm in size have a >80% spontaneous passage rate. Data surrounding the use of tamsulosin for medical expulsive therapy is controversial, but meta analyses suggests it is most efficacious for distal stones between 5-10mm in size. Possible side  effects include dizziness/lightheadedness. SWL has a lower stone free rate in a single procedure, but also a lower complication rate compared to ureteroscopy and avoids a stent and associated stent related symptoms. Possible complications include renal hematoma, steinstrasse, and need for additional treatment. Ureteroscopy with laser lithotripsy and stent placement has a higher stone free rate than SWL in a single procedure, however increased complication rate including possible infection, ureteral injury, bleeding, and stent related morbidity. Common stent related symptoms include dysuria, urgency/frequency, and flank pain.   After an extensive discussion of the risks and benefits of the above treatment options, the patient would like to proceed with MET however she is scheduled to travel to [place] at the end of next week.  Will tentatively add onto my OR schedule Tuesday for ureteroscopic stone removal if she does not pass the stone prior to that time    Cosby Proby C Matrice Herro, MD  [facility] [address], [address] [place], [place] [address] [phone]  

## 2021-11-26 NOTE — Progress Notes (Signed)
11/26/2021 8:51 AM   Philis Kendall May 22, 1970 [id number]  Referring provider: Steele Sizer, MD [address] [facility],  [place] [address]  Chief Complaint  Patient presents with   Nephrolithiasis    HPI: [name] is a 51 y.o. female who presents in follow-up of a recent ED visit.  ED visit 11/23/2021 with complaints of left flank pain which started the morning of her ED visit.  Pain was described as severe with radiation to the left upper abdomen Vomiting and diarrhea but no fever, chills or voiding symptoms Stone protocol CT showed a 5 mm left distal ureteral calculus with mild hydronephrosis/hydroureter.  Punctate nonobstructive renal calculi noted in the left kidney Prior history of recurrent stone disease.  Previous intervention with ureteroscopy/basket extraction ~ 20 years ago.  She has passed at least 3 stones the last in June 2021 She has had pelvic burning sensation and mild dysuria.  Has not required oral narcotic analgesic since discharge from the ED.  She is taking tamsulosin. Was also diagnosed with RSV which is improving   PMH: Past Medical History:  Diagnosis Date   Allergy    Anemia    Childhood   Arthritis    Asthma    exercise induced   Back pain    bulging disc   Back pain, chronic    Cellulitis    Clinical depression    Eczema    Endometritis    Fatigue    Headache    Hernia    Umbilical   History of chicken pox    Insomnia    Irregular menses    Kidney stones    Ovarian cyst    Over weight    Rectus diastasis    Right hip pain    Yeast infection     Surgical History: Past Surgical History:  Procedure Laterality Date   CESAREAN SECTION  2003,2005,2008,2010   FOOT SURGERY     neuroma left foot   meniscal repair Left 11/2018   tummy tuck  98/33/8250   UMBILICAL HERNIA REPAIR  05/31/2014   VENTRAL HERNIA REPAIR  05/31/2014   WISDOM TOOTH EXTRACTION      Home Medications:  Allergies as of 11/26/2021        Reactions   Bactrim [sulfamethoxazole-trimethoprim] Itching   Celebrex [celecoxib] Hives   Erythromycin Other (See Comments)   Severe abdominal pain   Penicillins Hives        Medication List        Accurate as of November 26, 2021  8:51 AM. If you have any questions, ask your nurse or doctor.          STOP taking these medications    fluconazole 150 MG tablet Commonly known as: DIFLUCAN Stopped by: Abbie Sons, MD   ketoconazole 2 % shampoo Commonly known as: NIZORAL Stopped by: Abbie Sons, MD   nystatin cream Commonly known as: MYCOSTATIN Stopped by: Abbie Sons, MD   nystatin powder Commonly known as: MYCOSTATIN/NYSTOP Stopped by: Abbie Sons, MD       TAKE these medications    albuterol 108 (90 Base) MCG/ACT inhaler Commonly known as: VENTOLIN HFA Inhale 2 puffs into the lungs every 6 (six) hours as needed for wheezing or shortness of breath.   clobetasol ointment 0.05 % Commonly known as: TEMOVATE   HYDROcodone-acetaminophen 5-325 MG tablet Commonly known as: NORCO/VICODIN Take 1 tablet by mouth every 6 (six) hours as needed for moderate pain.  predniSONE 10 MG tablet Commonly known as: DELTASONE Take 6 tabs on day 1, 5 tabs on day 2, 4 tabs on day 3, 3 tabs on day 4, 2 tabs on day 5, 1 tab on day 6   promethazine-dextromethorphan 6.25-15 MG/5ML syrup Commonly known as: PROMETHAZINE-DM Take 5 mLs by mouth 4 (four) times daily as needed for cough.   tamsulosin 0.4 MG Caps capsule Commonly known as: FLOMAX Take 1 capsule (0.4 mg total) by mouth daily.        Allergies:  Allergies  Allergen Reactions   Bactrim [Sulfamethoxazole-Trimethoprim] Itching   Celebrex [Celecoxib] Hives   Erythromycin Other (See Comments)    Severe abdominal pain   Penicillins Hives    Family History: Family History  Problem Relation Age of Onset   Cancer Mother 30       breast cancer   Osteoarthritis Mother    Broken bones Mother         Hip x2   Varicose Veins Mother    Hyperlipidemia Father    Hypertension Father    Heart attack Father    Hypertension Paternal Grandmother    Congenital heart disease Paternal Grandmother    Stroke Paternal Grandfather    Kidney disease Daughter    Hypercholesterolemia Brother    Colon cancer Maternal Grandmother    Heart disease Maternal Grandfather    Hypertension Sister    Migraines Sister    Thyroid disease Sister    GI problems Sister        Extra kink in her large intestine   Hypercholesterolemia Brother     Social History:  reports that she has never smoked. She has never used smokeless tobacco. She reports that she does not drink alcohol and does not use drugs.   Physical Exam: BP 119/77   Pulse 79   Ht _0  (1.575 m)   Wt 176 lb (79.8 kg)   BMI 32.19 kg/m   Constitutional:  Alert and oriented, No acute distress. HEENT: [place] AT Respiratory: Normal respiratory effort, no increased work of breathing. Psychiatric: Normal mood and affect.  Laboratory Data:  Urinalysis Dipstick 2+ blood/1+ leukocytes Microscopy 11-30 WBC/6-10 RBC   Pertinent Imaging: CT images were personally reviewed and interpreted  CT Renal Stone Study  Narrative CLINICAL DATA:  Left flank pain since this a.m.  EXAM: CT ABDOMEN AND PELVIS WITHOUT CONTRAST  TECHNIQUE: Multidetector CT imaging of the abdomen and pelvis was performed following the standard protocol without IV contrast.  RADIATION DOSE REDUCTION: This exam was performed according to the departmental dose-optimization program which includes automated exposure control, adjustment of the mA and/or kV according to patient size and/or use of iterative reconstruction technique.  COMPARISON:  CT abdomen pelvis October 08, 2008  FINDINGS: Lower chest: Bibasilar atelectasis/scarring.  Small hiatal hernia.  Hepatobiliary: Mild decreased hepatic attenuation may reflect hepatic steatosis. Gallbladder is unremarkable. No  biliary ductal dilation.  Pancreas: No pancreatic ductal dilation or evidence of acute inflammation.  Spleen: No splenomegaly or focal splenic lesion.  Adrenals/Urinary Tract: Bilateral adrenal glands appear normal.  Mild left hydroureteronephrosis to a 5 mm stone in the distal ureter near the ureterovesicular junction. Additional punctate nonobstructive left renal stones measure up to 1-2 mm. Urinary bladder is minimally distended limiting evaluation.  Stomach/Bowel: Stomach is minimally distended limiting evaluation. No pathologic dilation of small or large bowel. Normal appendix. No evidence of acute bowel inflammation.  Vascular/Lymphatic: Normal caliber abdominal aorta. No pathologically enlarged abdominal or pelvic lymph nodes.  Reproductive: Uterus and bilateral adnexa are unremarkable.  Other: No significant abdominopelvic free fluid.  Musculoskeletal: L5-S1 discogenic disease.  IMPRESSION: 1. Mild left hydroureteronephrosis to a 5 mm stone in the distal ureter near the ureterovesicular junction. 2. Additional punctate nonobstructive left renal stones measure up to 1-2 mm. 3. Mild decreased hepatic attenuation may reflect hepatic steatosis. 4. Small hiatal hernia.   Electronically Signed By: Dahlia Bailiff M.D. On: 11/23/2021 13:35   Assessment & Plan:    1.  Left distal ureteral calculus We discussed various treatment options for urolithiasis including observation with or without medical expulsive therapy, shockwave lithotripsy (SWL), ureteroscopy and laser lithotripsy with stent placement. We discussed that management is based on stone size, location, density, patient co-morbidities, and patient preference.  Stones <5m in size have a >80% spontaneous passage rate. Data surrounding the use of tamsulosin for medical expulsive therapy is controversial, but meta analyses suggests it is most efficacious for distal stones between 5-151min size. Possible side  effects include dizziness/lightheadedness. SWL has a lower stone free rate in a single procedure, but also a lower complication rate compared to ureteroscopy and avoids a stent and associated stent related symptoms. Possible complications include renal hematoma, steinstrasse, and need for additional treatment. Ureteroscopy with laser lithotripsy and stent placement has a higher stone free rate than SWL in a single procedure, however increased complication rate including possible infection, ureteral injury, bleeding, and stent related morbidity. Common stent related symptoms include dysuria, urgency/frequency, and flank pain.   After an extensive discussion of the risks and benefits of the above treatment options, the patient would like to proceed with MET however [place] is [place] to [place] to [place] next [place].  Will tentatively add onto my OR schedule Tuesday for ureteroscopic stone removal if she does not pass the stone prior to that time    ScAbbie SonsMD  [address] Eagles StreetSuCalvertonuBerkeleyNC [phone]

## 2021-11-26 NOTE — Progress Notes (Unsigned)
Surgical Physician Order Form [facility]  * Scheduling expectation :  Tuesday 11/14  *Length of Case: 30 minutes  *Clearance needed: no  *Anticoagulation Instructions: N/A  *Aspirin Instructions: N/A  *Post-op visit Date/Instructions:  1 month follow up  *Diagnosis: Left Ureteral Stone  *Procedure: Left Ureteroscopy w/laser lithotripsy & stent placement (09407)   Additional orders: N/A  -Admit type: OUTpatient  -Anesthesia: General  -VTE Prophylaxis Standing Order SCD's       Other:   -Standing Lab Orders Per Anesthesia    Lab other: None  -Standing Test orders EKG/Chest x-ray per Anesthesia       Test other:   - Medications:  Ceftriaxone(Rocephin) 1gm IV  -Other orders:  N/A

## 2021-11-27 ENCOUNTER — Telehealth: Payer: Self-pay

## 2021-11-27 NOTE — Telephone Encounter (Signed)
I spoke with [name]. We have discussed possible surgery dates and Tuesday November 14th, 2023  was agreed upon by all parties. Patient given information about surgery date, what to expect pre-operatively and post operatively.  We discussed that a Pre-Admission Testing office will be calling to set up the pre-op visit that will take place prior to surgery, and that these appointments are typically done over the phone with a Pre-Admissions RN.  Informed patient that our office will communicate any additional care to be provided after surgery. Patients questions or concerns were discussed during our call. Advised to call our office should there be any additional information, questions or concerns that arise. Patient verbalized understanding.

## 2021-11-27 NOTE — Progress Notes (Signed)
[facility] Posting Form   Surgery Date/Time: Date: 12/02/2021  Surgeon: Dr. Irineo Axon, MD  Surgery Location: Day Surgery  Inpt ( No  )   Outpt (Yes)   Obs ( No  )   Diagnosis: N20.1 Left Ureteral Stone  -CPT: [phone]  Surgery: Left Ureteroscopy with laser lithotripsy and stent placement  Stop Anticoagulations: No  Cardiac/Medical/Pulmonary Clearance needed: no  *Orders entered into EPIC  Date: 11/27/21   *Case booked in EPIC  Date: 11/27/21  *Notified pt of Surgery: Date: 11/27/21  PRE-OP UA & CX: no  *Placed into Prior Authorization Work [place] Date: 11/27/21  Assistant/laser/rep:No

## 2021-11-27 NOTE — Telephone Encounter (Signed)
Tried calling patient to set up surgery, left detailed message to return my call. Will try again.

## 2021-11-28 ENCOUNTER — Other Ambulatory Visit: Payer: Self-pay | Admitting: Family Medicine

## 2021-11-28 ENCOUNTER — Telehealth: Payer: Self-pay

## 2021-11-28 ENCOUNTER — Inpatient Hospital Stay
Admission: RE | Admit: 2021-11-28 | Discharge: 2021-11-28 | Disposition: A | Discharge status: Home / Self Care | Payer: BC Managed Care – PPO | Source: Ambulatory Visit

## 2021-11-28 MED ORDER — KETOROLAC TROMETHAMINE 10 MG PO TABS: 10.0000 mg | ORAL_TABLET | Freq: Four times a day (QID) | ORAL | 0 refills | Status: DC | PRN

## 2021-11-28 NOTE — Patient Instructions (Addendum)
Your procedure is scheduled on: Tuesday 12-02-2021 Report to the Registration Desk on the 1st floor of the Medical Mall. To find out your arrival time, please call [phone] between 1PM - 3PM on: Monday 12-01-2021  REMEMBER: Instructions that are not followed completely may result in serious medical risk, up to and including death; or upon the discretion of your surgeon and anesthesiologist your surgery may need to be rescheduled.  Do not eat food after midnight the night before surgery.  No gum chewing, lozengers or hard candies.  You may however, drink clear liquids up to 2 hours before you are scheduled to arrive for your surgery. Do not drink anything within 2 hours of your scheduled arrival time.  Clear liquids include: - water  - apple juice without pulp - gatorade (not RED, PURPLE, OR BLUE) - black coffee or tea (Do NOT add milk or creamers to the coffee or tea) Do NOT drink anything that is not on this list.  Use inhalers on the day of surgery and bring to the hospital if you typically use them regularly.  One week prior to surgery: Stop Anti-inflammatories (NSAIDS) such as Advil, Aleve, Ibuprofen, Motrin, Naproxen, Naprosyn and Aspirin based products such as Excedrin, Goodys Powder, BC Powder. You may however, continue to take Tylenol if needed for pain up until the day of surgery.  Stop any over the counter vitamins and supplements until after surgery.   No Alcohol for 24 hours before or after surgery.  No Smoking including e-cigarettes for 24 hours prior to surgery.  No chewable tobacco products for at least 6 hours prior to surgery.  No nicotine patches on the day of surgery.  Do not use any "recreational" drugs for at least a week prior to your surgery.  Please be advised that the combination of cocaine and anesthesia may have negative outcomes, up to and including death. If you test positive for cocaine, your surgery will be cancelled.  On the morning of  surgery brush your teeth with toothpaste and water, you may rinse your mouth with mouthwash if you wish. Do not swallow any toothpaste or mouthwash.  Do not wear jewelry, make-up, hairpins, clips or nail polish.  Do not wear lotions, powders, or perfumes.   Do not shave body from the neck down 48 hours prior to surgery just in case you cut yourself which could leave a site for infection.   Do not bring valuables to the hospital. [facility] is not responsible for any missing/lost belongings or valuables.   Notify your doctor if there is any change in your medical condition (cold, fever, infection).  Wear comfortable clothing (specific to your surgery type) to the hospital.   If you are being discharged the day of surgery, you will not be allowed to drive home. You will need a responsible adult (18 years or older) to drive you home and stay with you that night.   If you are taking public transportation, you will need to have a responsible adult (18 years or older) with you. Please confirm with your physician that it is acceptable to use public transportation.   Please call the Pre-admissions Testing Dept. at [phone] if you have any questions about these instructions.  Surgery Visitation Policy:  Patients undergoing a surgery or procedure may have two family members or support persons with them as long as the person is not COVID-19 positive or experiencing its symptoms.

## 2021-11-28 NOTE — Telephone Encounter (Signed)
Spoke to patient and she is having severe left flank pain. She is scheduled for surgery on Tuesday but she feels like the stone has moved. She does not have a fever, she is concerned about getting through the weekend. Please advise.

## 2021-11-28 NOTE — Telephone Encounter (Signed)
Spoke to patient and per [facility] patient can take Toradol in between the Vicodin if needed for pain until surgery day.

## 2021-12-01 ENCOUNTER — Encounter
Admission: RE | Admit: 2021-12-01 | Discharge: 2021-12-01 | Disposition: A | Discharge status: Home / Self Care | Payer: BC Managed Care – PPO | Source: Ambulatory Visit | Attending: Urology | Admitting: Urology

## 2021-12-01 ENCOUNTER — Other Ambulatory Visit: Payer: Self-pay

## 2021-12-01 DIAGNOSIS — Z01812 Encounter for preprocedural laboratory examination: Secondary | ICD-10-CM

## 2021-12-01 HISTORY — DX: Personal history of COVID-19: Z86.16

## 2021-12-01 HISTORY — DX: Personal history of urinary calculi: Z87.442

## 2021-12-01 HISTORY — DX: Exercise induced bronchospasm: J45.990

## 2021-12-01 HISTORY — DX: Personal history of other diseases of the digestive system: Z87.19

## 2021-12-01 HISTORY — DX: Umbilical hernia without obstruction or gangrene: K42.9

## 2021-12-01 NOTE — Patient Instructions (Addendum)
Your procedure is scheduled on: 12/02/21 - Tuesday Report to the Registration Desk on the 1st floor of the Medical [facility]. To find out your arrival time, please call [phone] between 1PM - 3PM on: 12/01/21 - Monday If your arrival time is 6:00 am, do not arrive prior to that time as the Medical Mall entrance doors do not open until 6:00 am.  REMEMBER: Instructions that are not followed completely may result in serious medical risk, up to and including death; or upon the discretion of your surgeon and anesthesiologist your surgery may need to be rescheduled.  Do not eat food or drink any liquids after midnight the night before surgery.  No gum chewing, lozengers or hard candies.  TAKE THESE MEDICATIONS THE MORNING OF SURGERY WITH A SIP OF WATER:  - albuterol (VENTOLIN HFA)  - HYDROcodone-acetaminophen   One week prior to surgery: Stop Anti-inflammatories (NSAIDS) such as Advil, Aleve, Ibuprofen, Motrin, Naproxen, Naprosyn and Aspirin based products such as Excedrin, Goodys Powder, BC Powder.  Stop ANY OVER THE COUNTER supplements until after surgery.You may however, continue to take Tylenol if needed for pain up until the day of surgery.  No Alcohol for 24 hours before or after surgery.  No Smoking including e-cigarettes for 24 hours prior to surgery.  No chewable tobacco products for at least 6 hours prior to surgery.  No nicotine patches on the day of surgery.  Do not use any "recreational" drugs for at least a week prior to your surgery.  Please be advised that the combination of cocaine and anesthesia may have negative outcomes, up to and including death. If you test positive for cocaine, your surgery will be cancelled.  On the morning of surgery brush your teeth with toothpaste and water, you may rinse your mouth with mouthwash if you wish. Do not swallow any toothpaste or mouthwash.  Do not wear jewelry, make-up, hairpins, clips or nail polish.  Do not wear lotions,  powders, or perfumes.   Do not shave body from the neck down 48 hours prior to surgery just in case you cut yourself which could leave a site for infection. Also, freshly shaved skin may become irritated if using the CHG soap.  Contact lenses, hearing aids and dentures may not be worn into surgery.  Do not bring valuables to the hospital. [facility] is not responsible for any missing/lost belongings or valuables.   Notify your doctor if there is any change in your medical condition (cold, fever, infection).  Wear comfortable clothing (specific to your surgery type) to the hospital.  After surgery, you can help prevent lung complications by doing breathing exercises.  Take deep breaths and cough every 1-2 hours. Your doctor may order a device called an Incentive Spirometer to help you take deep breaths. When coughing or sneezing, hold a pillow firmly against your incision with both hands. This is called "splinting." Doing this helps protect your incision. It also decreases belly discomfort.  If you are being admitted to the hospital overnight, leave your suitcase in the car. After surgery it may be brought to your room.  If you are being discharged the day of surgery, you will not be allowed to drive home. You will need a responsible adult (18 years or older) to drive you home and stay with you that night.   If you are taking public transportation, you will need to have a responsible adult (18 years or older) with you. Please confirm with your physician that it is  acceptable to use public transportation.   Please call the Pre-admissions Testing Dept. at [phone] if you have any questions about these instructions.  Surgery Visitation Policy:  Patients undergoing a surgery or procedure may have two family members or support persons with them as long as the person is not COVID-19 positive or experiencing its symptoms.   Inpatient Visitation:    Visiting hours are 7 a.m. to 8  p.m. Up to four visitors are allowed at one time in a patient room. The visitors may rotate out with other people during the day. One designated support person (adult) may remain overnight.  MASKING: Due to an increase in RSV rates and hospitalizations, starting Wednesday, Nov. 15, in patient care areas in which we serve newborns, infants and children, masks will be required for teammates and visitors.  Children ages 82 and under may not visit. This policy affects the following departments only:  [facility] Postpartum area Mother Baby Unit Newborn nursery/Special care nursery  Other areas: Masks continue to be strongly recommended for patient-facing teammates, visitors and patients in all other areas. Visitation is not restricted outside of the units listed above.

## 2021-12-02 ENCOUNTER — Encounter
Admission: RE | Disposition: A | Discharge status: Home / Self Care | Payer: Self-pay | Source: Home / Self Care | Attending: Urology

## 2021-12-02 ENCOUNTER — Ambulatory Visit: Payer: BC Managed Care – PPO | Admitting: Anesthesiology

## 2021-12-02 ENCOUNTER — Ambulatory Visit: Payer: BC Managed Care – PPO

## 2021-12-02 ENCOUNTER — Other Ambulatory Visit: Payer: Self-pay

## 2021-12-02 ENCOUNTER — Ambulatory Visit
Admission: RE | Admit: 2021-12-02 | Discharge: 2021-12-02 | Disposition: A | Discharge status: Home / Self Care | Payer: BC Managed Care – PPO | Attending: Urology | Admitting: Urology

## 2021-12-02 ENCOUNTER — Ambulatory Visit: Payer: BC Managed Care – PPO | Admitting: Urgent Care

## 2021-12-02 ENCOUNTER — Encounter: Payer: Self-pay | Admitting: Urology

## 2021-12-02 DIAGNOSIS — Z6832 Body mass index (BMI) 32.0-32.9, adult: Secondary | ICD-10-CM | POA: Diagnosis not present

## 2021-12-02 DIAGNOSIS — F32A Depression, unspecified: Secondary | ICD-10-CM | POA: Diagnosis not present

## 2021-12-02 DIAGNOSIS — J45909 Unspecified asthma, uncomplicated: Secondary | ICD-10-CM | POA: Insufficient documentation

## 2021-12-02 DIAGNOSIS — Z87442 Personal history of urinary calculi: Secondary | ICD-10-CM | POA: Insufficient documentation

## 2021-12-02 DIAGNOSIS — N201 Calculus of ureter: Secondary | ICD-10-CM | POA: Diagnosis not present

## 2021-12-02 DIAGNOSIS — E669 Obesity, unspecified: Secondary | ICD-10-CM | POA: Diagnosis not present

## 2021-12-02 DIAGNOSIS — Z01812 Encounter for preprocedural laboratory examination: Secondary | ICD-10-CM

## 2021-12-02 HISTORY — PX: CYSTOSCOPY/URETEROSCOPY/HOLMIUM LASER/STENT PLACEMENT: SHX6546

## 2021-12-02 LAB — POCT PREGNANCY, URINE: Preg Test, Ur: NEGATIVE

## 2021-12-02 SURGERY — CYSTOSCOPY/URETEROSCOPY/HOLMIUM LASER/STENT PLACEMENT
Anesthesia: General | Site: Ureter | Laterality: Left

## 2021-12-02 MED ORDER — PROPOFOL 10 MG/ML IV BOLUS
INTRAVENOUS | Status: AC
  Filled 2021-12-02: qty 20

## 2021-12-02 MED ORDER — CHLORHEXIDINE GLUCONATE 0.12 % MT SOLN
OROMUCOSAL | Status: AC
  Administered 2021-12-02: 15 mL via OROMUCOSAL
  Filled 2021-12-02: qty 15

## 2021-12-02 MED ORDER — FAMOTIDINE 20 MG PO TABS
ORAL_TABLET | ORAL | Status: AC
  Administered 2021-12-02: 20 mg via ORAL
  Filled 2021-12-02: qty 1

## 2021-12-02 MED ORDER — DEXAMETHASONE SODIUM PHOSPHATE 10 MG/ML IJ SOLN
INTRAMUSCULAR | Status: DC | PRN
  Administered 2021-12-02: 10 mg via INTRAVENOUS

## 2021-12-02 MED ORDER — LIDOCAINE HCL (PF) 2 % IJ SOLN
INTRAMUSCULAR | Status: AC
  Filled 2021-12-02: qty 5

## 2021-12-02 MED ORDER — STERILE WATER FOR IRRIGATION IR SOLN
Status: DC | PRN
  Administered 2021-12-02: 500 mL

## 2021-12-02 MED ORDER — ONDANSETRON HCL 4 MG/2ML IJ SOLN
INTRAMUSCULAR | Status: AC
  Filled 2021-12-02: qty 2

## 2021-12-02 MED ORDER — OXYBUTYNIN CHLORIDE 5 MG PO TABS
ORAL_TABLET | ORAL | Status: AC
  Administered 2021-12-02: 5 mg via ORAL
  Filled 2021-12-02: qty 1

## 2021-12-02 MED ORDER — OXYCODONE HCL 5 MG PO TABS: 5.0000 mg | ORAL_TABLET | Freq: Once | ORAL | Status: DC | PRN

## 2021-12-02 MED ORDER — FENTANYL CITRATE (PF) 100 MCG/2ML IJ SOLN
INTRAMUSCULAR | Status: AC
  Filled 2021-12-02: qty 2

## 2021-12-02 MED ORDER — HYDROMORPHONE HCL 1 MG/ML IJ SOLN: 0.2500 mg | INTRAMUSCULAR | Status: DC | PRN

## 2021-12-02 MED ORDER — MIDAZOLAM HCL 2 MG/2ML IJ SOLN
INTRAMUSCULAR | Status: DC | PRN
  Administered 2021-12-02: 2 mg via INTRAVENOUS

## 2021-12-02 MED ORDER — CHLORHEXIDINE GLUCONATE 0.12 % MT SOLN: 15.0000 mL | Freq: Once | OROMUCOSAL | Status: AC

## 2021-12-02 MED ORDER — OXYBUTYNIN CHLORIDE 5 MG PO TABS: 5.0000 mg | ORAL_TABLET | Freq: Once | ORAL | Status: AC

## 2021-12-02 MED ORDER — PROPOFOL 10 MG/ML IV BOLUS
INTRAVENOUS | Status: DC | PRN
  Administered 2021-12-02: 200 mg via INTRAVENOUS

## 2021-12-02 MED ORDER — SODIUM CHLORIDE 0.9 % IR SOLN
Status: DC | PRN
  Administered 2021-12-02: 3000 mL via INTRAVESICAL

## 2021-12-02 MED ORDER — ONDANSETRON HCL 4 MG/2ML IJ SOLN
INTRAMUSCULAR | Status: DC | PRN
  Administered 2021-12-02: 4 mg via INTRAVENOUS

## 2021-12-02 MED ORDER — OXYBUTYNIN CHLORIDE 5 MG PO TABS: ORAL_TABLET | ORAL | 0 refills | Status: DC

## 2021-12-02 MED ORDER — PROPOFOL 10 MG/ML IV BOLUS
INTRAVENOUS | Status: AC
  Filled 2021-12-02: qty 40

## 2021-12-02 MED ORDER — OXYCODONE HCL 5 MG/5ML PO SOLN: 5.0000 mg | Freq: Once | ORAL | Status: DC | PRN

## 2021-12-02 MED ORDER — FENTANYL CITRATE (PF) 100 MCG/2ML IJ SOLN
INTRAMUSCULAR | Status: DC | PRN
  Administered 2021-12-02 (×2): 50 ug via INTRAVENOUS

## 2021-12-02 MED ORDER — FAMOTIDINE 20 MG PO TABS: 20.0000 mg | ORAL_TABLET | Freq: Once | ORAL | Status: AC

## 2021-12-02 MED ORDER — DEXAMETHASONE SODIUM PHOSPHATE 10 MG/ML IJ SOLN
INTRAMUSCULAR | Status: AC
  Filled 2021-12-02: qty 1

## 2021-12-02 MED ORDER — LIDOCAINE HCL (CARDIAC) PF 100 MG/5ML IV SOSY
PREFILLED_SYRINGE | INTRAVENOUS | Status: DC | PRN
  Administered 2021-12-02: 100 mg via INTRAVENOUS

## 2021-12-02 MED ORDER — IOHEXOL 180 MG/ML  SOLN
INTRAMUSCULAR | Status: DC | PRN
  Administered 2021-12-02: 10 mL

## 2021-12-02 MED ORDER — SODIUM CHLORIDE 0.9 % IV SOLN
1.0000 g | INTRAVENOUS | Status: AC
  Administered 2021-12-02: 1 g via INTRAVENOUS
  Filled 2021-12-02: qty 10

## 2021-12-02 MED ORDER — MIDAZOLAM HCL 2 MG/2ML IJ SOLN
INTRAMUSCULAR | Status: AC
  Filled 2021-12-02: qty 2

## 2021-12-02 MED ORDER — LACTATED RINGERS IV SOLN: INTRAVENOUS | Status: DC

## 2021-12-02 MED ORDER — ORAL CARE MOUTH RINSE: 15.0000 mL | Freq: Once | OROMUCOSAL | Status: AC

## 2021-12-02 SURGICAL SUPPLY — 29 items
BAG DRAIN SIEMENS DORNER NS (MISCELLANEOUS) ×2 IMPLANT
BAG DRN NS LF (MISCELLANEOUS) ×1
BASKET ZERO TIP 1.9FR (BASKET) IMPLANT
BRUSH SCRUB EZ 1% IODOPHOR (MISCELLANEOUS) ×2 IMPLANT
BSKT STON RTRVL ZERO TP 1.9FR (BASKET) ×1
CATH URET FLEX-TIP 2 LUMEN 10F (CATHETERS) IMPLANT
CATH URETL OPEN END 6X70 (CATHETERS) IMPLANT
CNTNR SPEC 2.5X3XGRAD LEK (MISCELLANEOUS)
CONT SPEC 4OZ STER OR WHT (MISCELLANEOUS)
CONT SPEC 4OZ STRL OR WHT (MISCELLANEOUS)
CONTAINER SPEC 2.5X3XGRAD LEK (MISCELLANEOUS) IMPLANT
DRAPE UTILITY 15X26 TOWEL STRL (DRAPES) ×2 IMPLANT
FIBER LASER MOSES 200 DFL (Laser) IMPLANT
GLOVE SURG UNDER POLY LF SZ7.5 (GLOVE) ×2 IMPLANT
GOWN STRL REUS W/ TWL LRG LVL3 (GOWN DISPOSABLE) ×2 IMPLANT
GOWN STRL REUS W/ TWL XL LVL3 (GOWN DISPOSABLE) ×2 IMPLANT
GOWN STRL REUS W/TWL LRG LVL3 (GOWN DISPOSABLE) ×1
GOWN STRL REUS W/TWL XL LVL3 (GOWN DISPOSABLE) ×1
GUIDEWIRE STR DUAL SENSOR (WIRE) ×2 IMPLANT
IV NS IRRIG 3000ML ARTHROMATIC (IV SOLUTION) ×2 IMPLANT
KIT TURNOVER CYSTO (KITS) ×2 IMPLANT
PACK CYSTO AR (MISCELLANEOUS) ×2 IMPLANT
SET CYSTO W/LG BORE CLAMP LF (SET/KITS/TRAYS/PACK) ×2 IMPLANT
SHEATH NAVIGATOR HD 12/14X36 (SHEATH) IMPLANT
STENT URET 6FRX24 CONTOUR (STENTS) IMPLANT
STENT URET 6FRX26 CONTOUR (STENTS) IMPLANT
SURGILUBE 2OZ TUBE FLIPTOP (MISCELLANEOUS) ×2 IMPLANT
VALVE UROSEAL ADJ ENDO (VALVE) IMPLANT
WATER STERILE IRR 500ML POUR (IV SOLUTION) ×2 IMPLANT

## 2021-12-02 NOTE — Transfer of Care (Signed)
Immediate Anesthesia Transfer of Care Note  Patient: [name]  Procedure(s) Performed: CYSTOSCOPY/URETEROSCOPY/HOLMIUM LASER/STENT PLACEMENT (Left: Ureter)  Patient Location: PACU  Anesthesia Type:General  Level of Consciousness: awake, alert , and oriented  Airway & Oxygen Therapy: Patient Spontanous Breathing and Patient connected to nasal cannula oxygen  Post-op Assessment: Report given to RN, Post -op Vital signs reviewed and stable, and Patient moving all extremities  Post vital signs: Reviewed and stable  Last Vitals:  Vitals Value Taken Time  BP    Temp    Pulse 71 12/02/21 0820  Resp 15 12/02/21 0818  SpO2 100 % 12/02/21 0820  Vitals shown include unvalidated device data.  Last Pain:  Vitals:   12/02/21 0511  TempSrc: Temporal  PainSc: 0-No pain         Complications: No notable events documented.

## 2021-12-02 NOTE — Anesthesia Procedure Notes (Signed)
Procedure Name: LMA Insertion Date/Time: 12/02/2021 7:34 AM  Performed by: Katherine Basset, CRNAPre-anesthesia Checklist: Patient identified, Emergency Drugs available, Suction available and Patient being monitored Patient Re-evaluated:Patient Re-evaluated prior to induction Oxygen Delivery Method: Circle system utilized Preoxygenation: Pre-oxygenation with 100% oxygen Induction Type: IV induction LMA: LMA inserted LMA Size: 4.0 Number of attempts: 1 Placement Confirmation: positive ETCO2 and breath sounds checked- equal and bilateral Tube secured with: Tape Dental Injury: Teeth and Oropharynx as per pre-operative assessment  Comments: Igel size 4 utilized w/o difficulty

## 2021-12-02 NOTE — Discharge Instructions (Addendum)
AMBULATORY SURGERY  DISCHARGE INSTRUCTIONS   The drugs that you were given will stay in your system until tomorrow so for the next 24 hours you should not:  Drive an automobile Make any legal decisions Drink any alcoholic beverage   You may resume regular meals tomorrow.  Today it is better to start with liquids and gradually work up to solid foods.  You may eat anything you prefer, but it is better to start with liquids, then soup and crackers, and gradually work up to solid foods.   Please notify your doctor immediately if you have any unusual bleeding, trouble breathing, redness and pain at the surgery site, drainage, fever, or pain not relieved by medication.    Additional Instructions:  DISCHARGE INSTRUCTIONS FOR KIDNEY STONE/URETERAL STENT   MEDICATIONS:  1. Resume all your other meds from home.  2.  AZO (over-the-counter) can help with the burning/stinging when you urinate. 3.  Oxybutynin is for bladder stent irritation, Rx was sent to your pharmacy.  ACTIVITY:  1. May resume regular activities in 24 hours. 2. No driving while on narcotic pain medications  3. Drink plenty of water  4. Continue to walk at home - you can still get blood clots when you are at home, so keep active, but don't over do it.  5. May return to work/school tomorrow or when you feel ready   BATHING:  1. You can shower. 2. You have a string coming from your urethra: The stent string is attached to your ureteral stent. Do not pull on this.   SIGNS/SYMPTOMS TO CALL:  Common postoperative symptoms include urinary frequency, urgency, bladder spasm and blood in the urine  Please call us if you have a fever greater than 101.5, uncontrolled nausea/vomiting, uncontrolled pain, dizziness, unable to urinate, excessively bloody urine, chest pain, shortness of breath, leg swelling, leg pain, or any other concerns or questions.   You can reach [place] at [phone].   FOLLOW-UP:  1. You have a follow-up  appointment scheduled 01/02/2022  2. You have a string attached to your stent, you may remove it on Thursday, 12/04/2021. To do this, pull the string until the stent is completely removed. You may feel an odd sensation in your back.       Please contact your physician with any problems or Same Day Surgery at [phone], Monday through Friday 6 am to 4 pm, or [place] [facility] number at [phone].

## 2021-12-02 NOTE — Anesthesia Preprocedure Evaluation (Signed)
Anesthesia Evaluation  Patient identified by MRN, date of birth, ID band Patient awake    Reviewed: Allergy & Precautions, NPO status , Patient's Chart, lab work & pertinent test results  History of Anesthesia Complications Negative for: history of anesthetic complications  Airway Mallampati: II  TM Distance: >3 FB Neck ROM: full    Dental  (+) Teeth Intact   Pulmonary asthma    Pulmonary exam normal        Cardiovascular negative cardio ROS Normal cardiovascular exam     Neuro/Psych  PSYCHIATRIC DISORDERS  Depression    negative neurological ROS     GI/Hepatic negative GI ROS, Neg liver ROS,,,  Endo/Other  negative endocrine ROS    Renal/GU      Musculoskeletal   Abdominal   Peds  Hematology negative hematology ROS (+)   Anesthesia Other Findings Past Medical History: No date: Allergy No date: Anemia     Comment:  Childhood No date: Arthritis No date: Back pain     Comment:  bulging disc No date: Cellulitis No date: Clinical depression No date: Eczema No date: Endometritis No date: Exercise-induced asthma No date: Fatigue No date: Headache No date: History of 2019 novel coronavirus disease (COVID-19)     Comment:  a.) 02/03/2019; b.) 10/01/2020 No date: History of chicken pox No date: History of hiatal hernia No date: History of kidney stones 11/23/2021: History of RSV infection     Comment:  a.) Tx'd with 6 day prednisone taper + promethazine/DM               cough syrup No date: Insomnia No date: Irregular menses No date: Ovarian cyst No date: Over weight No date: Pneumonia No date: Rectus diastasis No date: Right hip pain No date: Umbilical hernia No date: Yeast infection  Past Surgical History: 2003,2005,2008,2010: CESAREAN SECTION No date: FOOT SURGERY     Comment:  neuroma left foot No date: MEDIAL PARTIAL KNEE REPLACEMENT; Left 11/2018: meniscal repair; Left 05/31/2014: tummy  tuck 05/31/2014: UMBILICAL HERNIA REPAIR 05/31/2014: VENTRAL HERNIA REPAIR No date: WISDOM TOOTH EXTRACTION     Comment:  not under sedation  BMI    Body Mass Index: 32.19 kg/m      Reproductive/Obstetrics negative OB ROS                              Anesthesia Physical Anesthesia Plan  ASA: 2  Anesthesia Plan: General LMA   Post-op Pain Management: Minimal or no pain anticipated   Induction: Intravenous  PONV Risk Score and Plan: Dexamethasone, Ondansetron, Midazolam and Treatment may vary due to age or medical condition  Airway Management Planned: LMA  Additional Equipment:   Intra-op Plan:   Post-operative Plan: Extubation in OR  Informed Consent: I have reviewed the patients History and Physical, chart, labs and discussed the procedure including the risks, benefits and alternatives for the proposed anesthesia with the patient or authorized representative who has indicated his/her understanding and acceptance.     Dental Advisory Given  Plan Discussed with: Anesthesiologist, CRNA and Surgeon  Anesthesia Plan Comments: (Patient consented for risks of anesthesia including but not limited to:  - adverse reactions to medications - damage to eyes, teeth, lips or other oral mucosa - nerve damage due to positioning  - sore throat or hoarseness - Damage to heart, brain, nerves, lungs, other parts of body or loss of life  Patient voiced understanding.)  Anesthesia Quick Evaluation  

## 2021-12-02 NOTE — Interval H&P Note (Signed)
History and Physical Interval Note:  Remains symptomatic and not aware of passing the stone.  CV: RRR Lungs: Clear  12/02/2021 7:13 AM  [name]  has presented today for surgery, with the diagnosis of Left Ureteral Stone.  The various methods of treatment have been discussed with the patient and family. After consideration of risks, benefits and other options for treatment, the patient has consented to  Procedure(s): CYSTOSCOPY/URETEROSCOPY/HOLMIUM LASER/STENT PLACEMENT (Left) as a surgical intervention.  The patient's history has been reviewed, patient examined, no change in status, stable for surgery.  I have reviewed the patient's chart and labs.  Questions were answered to the patient's satisfaction.     [name]

## 2021-12-02 NOTE — Op Note (Signed)
   Preoperative diagnosis: Left distal ureteral calculus  Postoperative diagnosis: Left distal ureteral calculus  Procedure:  Cystoscopy Left ureteroscopy and stone removal Ureteroscopic laser lithotripsy Left ureteral stent placement (59F/24 cm)  Left retrograde pyelography with interpretation  Surgeon: Lorin Picket C. Zeus Marquis, M.D.  Anesthesia: General  Complications: None  Intraoperative findings:  Cystoscopy-moderate inflammatory changes left UO.  No solid or papillary lesions Ureteroscopy-calculus at UVJ with moderate inflammatory changes.  Not impacted stone Left retrograde pyelography post procedure showed no filling defects, stone fragments or contrast extravasation  EBL: Minimal  Specimens: Calculus fragments for analysis   Indication: [name] is a 51 y.o. female with renal colic secondary to a 5 mm left distal ureteral calculus. After reviewing the management options for treatment, the patient elected to proceed with the above surgical procedure(s). We have discussed the potential benefits and risks of the procedure, side effects of the proposed treatment, the likelihood of the patient achieving the goals of the procedure, and any potential problems that might occur during the procedure or recuperation. Informed consent has been obtained.  Description of procedure:  The patient was taken to the operating room and general anesthesia was induced.  The patient was placed in the dorsal lithotomy position, prepped and draped in the usual sterile fashion, and preoperative antibiotics were administered. A preoperative time-out was performed.   A 21 French cystoscope was lubricated and passed per urethra.  Panendoscopy was performed with findings as described above  Attention was directed to the left ureteral orifice and a 0.038 Sensor wire was then advanced up the ureter into the renal pelvis under fluoroscopic guidance.  A 4.5 Fr semirigid ureteroscope was then advanced  into the ureter next to the guidewire and the calculus was identified just proximal to the UVJ.  The stone was then fragmented with a 200 m Moses holmium laser fiber on a setting of 0.3J/40 Hz    All fragments were then removed from the ureter with a zero tip nitinol basket.  Reinspection of the ureter revealed no remaining visible stones or fragments.   Retrograde pyelogram was performed with findings as described above.  A 59F/24 cm Contour ureteral stent was placed under fluoroscopic guidance.  The wire was then removed with an adequate positioning noted in the renal pelvis as well as in the bladder.  The bladder was then emptied and the procedure ended.  The patient appeared to tolerate the procedure well and without complications.  After anesthetic reversal the patient was transported to the PACU in stable condition.   Plan: The stent was left attached to a tether and she was instructed to remove on Thursday, 12/04/2021  Postop follow-up appointment scheduled 01/02/2022   Irineo Axon, MD

## 2021-12-02 NOTE — Anesthesia Postprocedure Evaluation (Signed)
Anesthesia Post Note  Patient: [name]  Procedure(s) Performed: CYSTOSCOPY/URETEROSCOPY/HOLMIUM LASER/STENT PLACEMENT (Left: Ureter)  Patient location during evaluation: PACU Anesthesia Type: General Level of consciousness: awake and alert Pain management: pain level controlled Vital Signs Assessment: post-procedure vital signs reviewed and stable Respiratory status: spontaneous breathing, nonlabored ventilation, respiratory function stable and patient connected to nasal cannula oxygen Cardiovascular status: blood pressure returned to baseline and stable Postop Assessment: no apparent nausea or vomiting Anesthetic complications: no   No notable events documented.   Last Vitals:  Vitals:   12/02/21 0819 12/02/21 0834  BP: 121/73 (!) 141/74  Pulse: 78 61  Resp: 15 20  Temp: 36.4 C 37 C  SpO2: 98% 97%    Last Pain:  Vitals:   12/02/21 0834  TempSrc:   PainSc: 0-No pain                 Louie Boston

## 2021-12-10 LAB — CALCULI, WITH PHOTOGRAPH (CLINICAL LAB)
Calcium Oxalate Dihydrate: 100 %
Weight Calculi: 1 mg

## 2021-12-15 NOTE — Patient Instructions (Signed)
Preventive Care 51-51 Years Old, Female Preventive care refers to lifestyle choices and visits with your health care provider that can promote health and wellness. Preventive care visits are also called wellness exams. What can I expect for my preventive care visit? Counseling Your health care provider may ask you questions about your: Medical history, including: Past medical problems. Family medical history. Pregnancy history. Current health, including: Menstrual cycle. Method of birth control. Emotional well-being. Home life and relationship well-being. Sexual activity and sexual health. Lifestyle, including: Alcohol, nicotine or tobacco, and drug use. Access to firearms. Diet, exercise, and sleep habits. Work and work environment. Sunscreen use. Safety issues such as seatbelt and bike helmet use. Physical exam Your health care provider will check your: Height and weight. These may be used to calculate your BMI (body mass index). BMI is a measurement that tells if you are at a healthy weight. Waist circumference. This measures the distance around your waistline. This measurement also tells if you are at a healthy weight and may help predict your risk of certain diseases, such as type 2 diabetes and high blood pressure. Heart rate and blood pressure. Body temperature. Skin for abnormal spots. What immunizations do I need?  Vaccines are usually given at various ages, according to a schedule. Your health care provider will recommend vaccines for you based on your age, medical history, and lifestyle or other factors, such as travel or where you work. What tests do I need? Screening Your health care provider may recommend screening tests for certain conditions. This may include: Lipid and cholesterol levels. Diabetes screening. This is done by checking your blood sugar (glucose) after you have not eaten for a while (fasting). Pelvic exam and Pap test. Hepatitis B test. Hepatitis C  test. HIV (human immunodeficiency virus) test. STI (sexually transmitted infection) testing, if you are at risk. Lung cancer screening. Colorectal cancer screening. Mammogram. Talk with your health care provider about when you should start having regular mammograms. This may depend on whether you have a family history of breast cancer. BRCA-related cancer screening. This may be done if you have a family history of breast, ovarian, tubal, or peritoneal cancers. Bone density scan. This is done to screen for osteoporosis. Talk with your health care provider about your test results, treatment options, and if necessary, the need for more tests. Follow these instructions at home: Eating and drinking  Eat a diet that includes fresh fruits and vegetables, whole grains, lean protein, and low-fat dairy products. Take vitamin and mineral supplements as recommended by your health care provider. Do not drink alcohol if: Your health care provider tells you not to drink. You are pregnant, may be pregnant, or are planning to become pregnant. If you drink alcohol: Limit how much you have to 0-1 drink a day. Know how much alcohol is in your drink. In the [place] one drink equals one 12 oz bottle of beer (355 mL), one 5 oz glass of wine (148 mL), or one 1 oz glass of hard liquor (44 mL). Lifestyle Brush your teeth every morning and night with fluoride toothpaste. Floss one time each day. Exercise for at least 30 minutes 5 or more days each week. Do not use any products that contain nicotine or tobacco. These products include cigarettes, chewing tobacco, and vaping devices, such as e-cigarettes. If you need help quitting, ask your health care provider. Do not use drugs. If you are sexually active, practice safe sex. Use a condom or other form of protection to   prevent STIs. If you do not wish to become pregnant, use a form of birth control. If you plan to become pregnant, see your health care provider for a  prepregnancy visit. Take aspirin only as told by your health care provider. Make sure that you understand how much to take and what form to take. Work with your health care provider to find out whether it is safe and beneficial for you to take aspirin daily. Find healthy ways to manage stress, such as: Meditation, yoga, or listening to music. Journaling. Talking to a trusted person. Spending time with friends and family. Minimize exposure to UV radiation to reduce your risk of skin cancer. Safety Always wear your seat belt while driving or riding in a vehicle. Do not drive: If you have been drinking alcohol. Do not ride with someone who has been drinking. When you are tired or distracted. While texting. If you have been using any mind-altering substances or drugs. Wear a helmet and other protective equipment during sports activities. If you have firearms in your house, make sure you follow all gun safety procedures. Seek help if you have been physically or sexually abused. What's next? Visit your health care provider once a year for an annual wellness visit. Ask your health care provider how often you should have your eyes and teeth checked. Stay up to date on all vaccines. This information is not intended to replace advice given to you by your health care provider. Make sure you discuss any questions you have with your health care provider. Document Revised: 07/03/2020 Document Reviewed: 07/03/2020 [employer] Patient Education  [place].

## 2021-12-15 NOTE — Progress Notes (Signed)
Name: [name]   MRN: [id number]    DOB: 11-30-1970   Date:12/16/2021       Progress Note  Subjective  Chief Complaint  Annual Exam  HPI  Patient presents for annual CPE.  Diet: she tries to eat healthy  Exercise: she is active  Last Eye Exam: she is due for an appointment  Last Dental Exam: every 6 months   [place] Visit from 09/02/2020 in [facility]  AUDIT-C Score 1      Depression: Phq 9 is  negative    12/16/2021    2:06 PM 12/16/2021    2:05 PM 09/02/2020    8:16 AM 08/31/2019    8:36 AM 08/29/2018    9:00 AM  Depression screen PHQ 2/9  Decreased Interest 0 0 0 0 0  Down, Depressed, Hopeless 0 0 0 0 0  PHQ - 2 Score 0 0 0 0 0  Altered sleeping 0  0 0 0  Tired, decreased energy 0  0 0 0  Change in appetite 0  0 0 0  Feeling bad or failure about yourself  0  0 0 0  Trouble concentrating 0  0 0 0  Moving slowly or fidgety/restless 0  0 0 0  Suicidal thoughts 0  0 0 0  PHQ-9 Score 0  0 0 0   Hypertension: BP Readings from Last 3 Encounters:  12/16/21 120/72  12/02/21 123/70  11/26/21 119/77   Obesity: Wt Readings from Last 3 Encounters:  12/16/21 174 lb 1.6 oz (79 kg)  12/02/21 176 lb (79.8 kg)  11/26/21 176 lb (79.8 kg)   BMI Readings from Last 3 Encounters:  12/16/21 31.84 kg/m  12/02/21 32.19 kg/m  11/26/21 32.19 kg/m     Vaccines:   Tdap: 2012, due Shingrix: N/A Pneumonia: N/A Flu: up to date COVID-19: up to date   Hep C Screening: 08/29/18 STD testing and prevention (HIV/chl/gon/syphilis): N/A Intimate partner violence: negative screen  Sexual History : one partner, married , she has vaginal dryness and pain during sex - discussing with gyn  Menstrual History/LMP/Abnormal Bleeding: 03/2020  Discussed importance of follow up if any post-menopausal bleeding: yes  Incontinence Symptoms: negative for symptoms   Breast cancer:  - Last Mammogram:she gets at gyn office  - BRCA gene screening:  N/A  Osteoporosis Prevention : Discussed high calcium and vitamin D supplementation, weight bearing exercises Bone density: N/A   Cervical cancer screening: Due in Dec, she will see gyn   Skin cancer: Discussed monitoring for atypical lesions  Colorectal cancer: Ordered today   Lung cancer:  Low Dose CT Chest recommended if Age 74-80 years, 20 pack-year currently smoking OR have quit w/in 15years. Patient does not qualify for screen   ECG: 03/08/18  Advanced Care Planning: A voluntary discussion about advance care planning including the explanation and discussion of advance directives.  Discussed health care proxy and Living will, and the patient was able to identify a health care proxy as husband .  Patient does not have a living will and power of attorney of health care   Lipids: Lab Results  Component Value Date   CHOL 178 09/02/2020   CHOL 182 08/31/2019   CHOL 200 (H) 08/29/2018   Lab Results  Component Value Date   HDL 57 09/02/2020   HDL 59 08/31/2019   HDL 58 08/29/2018   Lab Results  Component Value Date   LDLCALC 105 (H) 09/02/2020   LDLCALC 107 (  H) 08/31/2019   LDLCALC 122 (H) 08/29/2018   Lab Results  Component Value Date   TRIG 74 09/02/2020   TRIG 70 08/31/2019   TRIG 97 08/29/2018   Lab Results  Component Value Date   CHOLHDL 3.1 09/02/2020   CHOLHDL 3.1 08/31/2019   CHOLHDL 3.4 08/29/2018   No results found for: "LDLDIRECT"  Glucose: Glucose, Bld  Date Value Ref Range Status  11/23/2021 120 (H) 70 - 99 mg/dL Final    Comment:    Glucose reference range applies only to samples taken after fasting for at least 8 hours.  09/02/2020 97 65 - 99 mg/dL Final    Comment:    .            Fasting reference interval .   08/31/2019 84 65 - 99 mg/dL Final    Comment:    .            Fasting reference interval .     Patient Active Problem List   Diagnosis Date Noted   Hiatal hernia 12/16/2021   Hepatic steatosis 12/16/2021   Status post left  partial knee replacement 12/07/2019   Menopausal symptom 06/17/2017   Irregular periods 06/17/2017   Dysmenorrhea 06/17/2017   Vitamin D deficiency 12/04/2016   Plantar fasciitis, left 05/19/2016   Cervical high risk HPV (human papillomavirus) test positive 11/04/2015   Allergic rhinitis 10/10/2014   Back pain, chronic 10/10/2014   Dermatitis, eczematoid 10/10/2014   Insomnia 10/10/2014   H/O allergy to multiple drugs 02/22/2012   Blood type, Rh negative 02/22/2012   Previous cesarean delivery x 4 02/22/2012   Personal history of kidney stones 02/22/2012    Past Surgical History:  Procedure Laterality Date   CESAREAN SECTION  2003,2005,2008,2010   CYSTOSCOPY/URETEROSCOPY/HOLMIUM LASER/STENT PLACEMENT Left 12/02/2021   Procedure: CYSTOSCOPY/URETEROSCOPY/HOLMIUM LASER/STENT PLACEMENT;  Surgeon: Abbie Sons, MD;  Location: [facility] ORS;  Service: Urology;  Laterality: Left;   FOOT SURGERY     neuroma left foot   MEDIAL PARTIAL KNEE REPLACEMENT Left    meniscal repair Left 11/2018   tummy tuck  56/38/9373   UMBILICAL HERNIA REPAIR  05/31/2014   VENTRAL HERNIA REPAIR  05/31/2014   WISDOM TOOTH EXTRACTION     not under sedation    Family History  Problem Relation Age of Onset   Cancer Mother 58       breast cancer   Osteoarthritis Mother    Broken bones Mother        Hip x2   Varicose Veins Mother    Hyperlipidemia Father    Hypertension Father    Heart attack Father    Hypertension Paternal Grandmother    Congenital heart disease Paternal Grandmother    Stroke Paternal Grandfather    Kidney disease Daughter    Hypercholesterolemia Brother    Colon cancer Maternal Grandmother    Heart disease Maternal Grandfather    Hypertension Sister    Migraines Sister    Thyroid disease Sister    GI problems Sister        Extra kink in her large intestine   Hypercholesterolemia Brother     Social History   Socioeconomic History   Marital status: Married    Spouse name:  Nicole Kindred   Number of children: 4   Years of education: Not on file   Highest education level: Doctorate  Occupational History   Occupation: Professor    Employer: [employer]    Comment: [place]  Tobacco Use  Smoking status: Never   Smokeless tobacco: Never  Vaping Use   Vaping Use: Never used  Substance and Sexual Activity   Alcohol use: No   Drug use: No   Sexual activity: Yes    Partners: Male    Birth control/protection: Rhythm    Comment: married   Other Topics Concern   Not on file  Social History Narrative   Married, she has 4 daughters    She works as Development worker, international aid at [employer], and is on Optician, dispensing role and teaching   Social Determinants of Radio broadcast assistant Strain: [place]  (12/16/2021)   Overall Financial Resource Strain (CARDIA)    Difficulty of Paying Living Expenses: Not hard at all  Food Insecurity: No Food Insecurity (12/16/2021)   Hunger Vital Sign    Worried About Running Out of Food in the Last Year: Never true    [place] in the Last Year: Never true  Transportation Needs: No Transportation Needs (12/16/2021)   PRAPARE - Hydrologist (Medical): No    Lack of Transportation (Non-Medical): No  Physical Activity: Insufficiently Active (12/16/2021)   Exercise Vital Sign    Days of Exercise per Week: 4 days    Minutes of Exercise per Session: 30 min  Stress: No Stress Concern Present (12/16/2021)   [place]    Feeling of Stress : Not at all  Social Connections: [place] (12/16/2021)   Social Connection and Isolation Panel [NHANES]    Frequency of Communication with Friends and Family: Three times a week    Frequency of Social Gatherings with Friends and Family: Once a week    Attends Religious Services: More than 4 times per year    Active Member of [employer] or Organizations: Yes    Attends Music therapist: More than 4  times per year    Marital Status: Married  Human resources officer Violence: Not At Risk (12/16/2021)   Humiliation, Afraid, Rape, and Kick questionnaire    Fear of Current or Ex-Partner: No    Emotionally Abused: No    Physically Abused: No    Sexually Abused: No     Current Outpatient Medications:    albuterol (VENTOLIN HFA) 108 (90 Base) MCG/ACT inhaler, Inhale 2 puffs into the lungs every 6 (six) hours as needed for wheezing or shortness of breath., Disp: 8 g, Rfl: 2   clobetasol ointment (TEMOVATE) 0.05 %, as needed., Disp: , Rfl: 4  Allergies  Allergen Reactions   Bactrim [Sulfamethoxazole-Trimethoprim] Itching   Celebrex [Celecoxib] Hives   Erythromycin Other (See Comments)    Severe abdominal pain   Penicillins Hives     ROS  Constitutional: Negative for fever or weight change.  Respiratory: Negative for cough and shortness of breath.   Cardiovascular: Negative for chest pain or palpitations.  Gastrointestinal:she still has abdominal discomfort  no bowel changes.  Musculoskeletal: Negative for gait problem or joint swelling.  Skin: Negative for rash.  Neurological: Negative for dizziness or headache.  No other specific complaints in a complete review of systems (except as listed in HPI above).   Objective  Vitals:   12/16/21 1358  BP: 120/72  Pulse: 71  Resp: 16  Temp: 97.7 F (36.5 C)  TempSrc: Oral  SpO2: 98%  Weight: 174 lb 1.6 oz (79 kg)  Height: _0  (1.575 m)    Body mass index is 31.84 kg/m.  Physical  Exam  Constitutional: Patient appears well-developed and obese . No distress.  HENT: Head: Normocephalic and atraumatic. Ears: B TMs ok, no erythema or effusion; Nose: Nose normal. Mouth/Throat: Oropharynx is clear and moist. No oropharyngeal exudate.  Eyes: Conjunctivae and EOM are normal. Pupils are equal, round, and reactive to light. No scleral icterus.  Neck: Normal range of motion. Neck supple. No JVD present. No thyromegaly present.   Cardiovascular: Normal rate, regular rhythm and normal heart sounds.  No murmur heard. No BLE edema. Pulmonary/Chest: Effort normal and breath sounds normal. No respiratory distress. Abdominal: Soft. Bowel sounds are normal, no distension. She has scars from previous surgeries, diffuse abdominal discomfort  FEMALE GENITALIA:  Not done  RECTAL: not done  Musculoskeletal: Normal range of motion, no joint effusions. No gross deformities Neurological: he is alert and oriented to person, place, and time. No cranial nerve deficit. Coordination, balance, strength, speech and gait are normal.  Skin: Skin is warm and dry. No rash noted. No erythema.  Psychiatric: Patient has a normal mood and affect. behavior is normal. Judgment and thought content normal.   Recent Results (from the past 2160 hour(s))  POC COVID-19     Status: Normal   Collection Time: 11/21/21  9:06 AM  Result Value Ref Range   SARS Coronavirus 2 Ag Negative Negative  POCT rapid strep A     Status: Normal   Collection Time: 11/21/21  9:06 AM  Result Value Ref Range   Rapid Strep A Screen Negative Negative  POCT Influenza A/B     Status: Normal   Collection Time: 11/21/21  9:07 AM  Result Value Ref Range   Influenza A, POC Negative Negative   Influenza B, POC Negative Negative  Basic metabolic panel     Status: Abnormal   Collection Time: 11/23/21 11:24 AM  Result Value Ref Range   Sodium 139 135 - 145 mmol/L   Potassium 4.1 3.5 - 5.1 mmol/L   Chloride 107 98 - 111 mmol/L   CO2 21 (L) 22 - 32 mmol/L   Glucose, Bld 120 (H) 70 - 99 mg/dL    Comment: Glucose reference range applies only to samples taken after fasting for at least 8 hours.   BUN 21 (H) 6 - 20 mg/dL   Creatinine, Ser 0.92 0.44 - 1.00 mg/dL   Calcium 9.8 8.9 - 10.3 mg/dL   GFR, Estimated >60 >60 mL/min    Comment: (NOTE) Calculated using the CKD-EPI Creatinine Equation (2021)    Anion gap 11 5 - 15    Comment: Performed at [facility], [address] [place], [place] [address]  CBC     Status: Abnormal   Collection Time: 11/23/21 11:24 AM  Result Value Ref Range   WBC 7.3 4.0 - 10.5 K/uL   RBC 5.17 (H) 3.87 - 5.11 MIL/uL   Hemoglobin 14.9 12.0 - 15.0 g/dL   HCT 44.1 36.0 - 46.0 %   MCV 85.3 80.0 - 100.0 fL   MCH 28.8 26.0 - 34.0 pg   MCHC 33.8 30.0 - 36.0 g/dL   RDW 12.5 11.5 - 15.5 %   Platelets 245 150 - 400 K/uL   nRBC 0.0 0.0 - 0.2 %    Comment: Performed at [facility], [address] [place], [place] [address]  Resp Panel by RT-PCR (Flu A&B, Covid) Anterior Nasal Swab     Status: None   Collection Time: 11/23/21 12:19 PM   Specimen: Anterior Nasal Swab  Result  Value Ref Range   SARS Coronavirus 2 by RT PCR NEGATIVE NEGATIVE    Comment: (NOTE) SARS-CoV-2 target nucleic acids are NOT DETECTED.  The SARS-CoV-2 RNA is generally detectable in upper respiratory specimens during the acute phase of infection. The lowest concentration of SARS-CoV-2 viral copies this assay can detect is 138 copies/mL. A negative result does not preclude SARS-Cov-2 infection and should not be used as the sole basis for treatment or other patient management decisions. A negative result may occur with  improper specimen collection/handling, submission of specimen other than nasopharyngeal swab, presence of viral mutation(s) within the areas targeted by this assay, and inadequate number of viral copies(<138 copies/mL). A negative result must be combined with clinical observations, patient history, and epidemiological information. The expected result is Negative.  Fact Sheet for Patients:  EntrepreneurPulse.com.au  Fact Sheet for Healthcare Providers:  IncredibleEmployment.be  This test is no t yet approved or cleared by the [place] FDA and  has been authorized for detection and/or diagnosis of SARS-CoV-2 by FDA under an Emergency Use Authorization (EUA). This EUA will remain  in  effect (meaning this test can be used) for the duration of the COVID-19 declaration under Section 564(b)(1) of the Act, 21 U.S.C.section 360bbb-3(b)(1), unless the authorization is terminated  or revoked sooner.       Influenza A by PCR NEGATIVE NEGATIVE   Influenza B by PCR NEGATIVE NEGATIVE    Comment: (NOTE) The Xpert Xpress SARS-CoV-2/FLU/RSV plus assay is intended as an aid in the diagnosis of influenza from Nasopharyngeal swab specimens and should not be used as a sole basis for treatment. Nasal washings and aspirates are unacceptable for Xpert Xpress SARS-CoV-2/FLU/RSV testing.  Fact Sheet for Patients: EntrepreneurPulse.com.au  Fact Sheet for Healthcare Providers: IncredibleEmployment.be  This test is not yet approved or cleared by the [place] FDA and has been authorized for detection and/or diagnosis of SARS-CoV-2 by FDA under an Emergency Use Authorization (EUA). This EUA will remain in effect (meaning this test can be used) for the duration of the COVID-19 declaration under Section 564(b)(1) of the Act, 21 U.S.C. section 360bbb-3(b)(1), unless the authorization is terminated or revoked.  Performed at [facility], [place], [place] [address]   Group A Strep by PCR [facility] Only)     Status: None   Collection Time: 11/23/21 12:20 PM   Specimen: Anterior Nasal Swab; Sterile Swab  Result Value Ref Range   Group A Strep by PCR NOT DETECTED NOT DETECTED    Comment: Performed at [facility], [address] [place], [place] [address]  Urinalysis, Complete     Status: Abnormal   Collection Time: 11/26/21  8:42 AM  Result Value Ref Range   Specific Gravity, UA 1.030 1.005 - 1.030   pH, UA 5.5 5.0 - 7.5   Color, UA Yellow Yellow   Appearance Ur Clear Clear   Leukocytes,UA Negative Negative   Protein,UA 1+ (A) Negative/Trace   Glucose, UA Negative Negative   Ketones, UA Negative Negative    RBC, UA 2+ (A) Negative   Bilirubin, UA Negative Negative   Urobilinogen, Ur 0.2 0.2 - 1.0 mg/dL   Nitrite, UA Negative Negative   Microscopic Examination See below:   Microscopic Examination     Status: Abnormal   Collection Time: 11/26/21  8:42 AM   Urine  Result Value Ref Range   WBC, UA 6-10 (A) 0 - 5 /hpf   RBC, Urine 11-30 (A) 0 - 2 /hpf  Epithelial Cells (non renal) 0-10 0 - 10 /hpf   Mucus, UA Present (A) Not Estab.   Bacteria, UA Moderate (A) None seen/Few  Pregnancy, urine POC     Status: None   Collection Time: 12/02/21  6:39 AM  Result Value Ref Range   Preg Test, Ur NEGATIVE NEGATIVE    Comment:        THE SENSITIVITY OF THIS METHODOLOGY IS >24 mIU/mL   Calculi, with Photograph (to Clinical Lab)     Status: None   Collection Time: 12/02/21  8:06 AM  Result Value Ref Range   Source Calculi Comment     Comment: Not provided CORRECTED ON 11/22 AT 1436: PREVIOUSLY REPORTED AS KIDNEY STONE    Color Calculi Tan    Size Calculi 1x1 mm    Comment: (NOTE) Multiple pieces received.  Dimensions of the largest piece reported.    Weight Calculi 1 mg   Composition Calculi Comment     Comment: Percentage (Represents the % composition)   Calcium Oxalate Dihydrate 100 %   Comment Calculi 2 Comment     Comment: (NOTE) Calculus received wet. Wet calculi must be dried before analysis, which delays reporting of results. Leaving calculi wet (such as water, saline, blood, urine) may lead to changes in composition.    Photo Calculi Comment     Comment: Photograph will follow under a separate cover   Comment Calculi 3 Comment     Comment: (NOTE) Physician questions regarding Calculi Analysis contact [facility] at: [phone].    Please Note: Comment     Comment: (NOTE) Calculi report will follow via computer, mail or courier delivery.    DISCLAIMER: Comment     Comment: (NOTE) This test was developed and its performance characteristics determined by [facility].  It has  not been cleared or approved by the Food and Drug Administration. Performed At: [facility] [place], [place] [id number] Loletha Carrow PhD IO:[id number]      Fall Risk:    09/02/2020    8:16 AM 08/31/2019    8:36 AM 08/29/2018    9:00 AM 06/23/2018    9:31 AM 03/08/2018    3:26 PM  Fall Risk   Falls in the past year? 0 0 0 0 0  Number falls in past yr: 0 0 0 0 0  Injury with Fall? 0 0 0 0 0     Functional Status Survey: Is the patient deaf or have difficulty hearing?: No Does the patient have difficulty seeing, even when wearing glasses/contacts?: No Does the patient have difficulty concentrating, remembering, or making decisions?: No Does the patient have difficulty walking or climbing stairs?: No Does the patient have difficulty dressing or bathing?: No Does the patient have difficulty doing errands alone such as visiting a doctor's office or shopping?: No   Assessment & Plan  1. Well adult exam   2. Breast cancer screening by mammogram  - MM 3D SCREEN BREAST BILATERAL; Future  3. Colon cancer screening  - Ambulatory referral to Gastroenterology  4. Hiatal hernia  - Ambulatory referral to Gastroenterology  5. Hepatic steatosis  - Ambulatory referral to Gastroenterology  6. Choking sensation  - Ambulatory referral to Gastroenterology  7. Need for Tdap vaccination  - Tdap vaccine greater than or equal to 7yo IM  8. Need for shingles vaccine  - Zoster Recombinant (Shingrix )   -USPSTF grade A and B recommendations reviewed with patient; age-appropriate recommendations, preventive care, screening tests, etc discussed  and encouraged; healthy living encouraged; see AVS for patient education given to patient -Discussed importance of 150 minutes of physical activity weekly, eat two servings of fish weekly, eat one serving of tree nuts ( cashews, pistachios, pecans, almonds.Marland Kitchen) every other day, eat 6 servings of fruit/vegetables daily  and drink plenty of water and avoid sweet beverages.   -Reviewed Health Maintenance: Yes.

## 2021-12-16 ENCOUNTER — Ambulatory Visit (INDEPENDENT_AMBULATORY_CARE_PROVIDER_SITE_OTHER): Payer: BC Managed Care – PPO | Admitting: Family Medicine

## 2021-12-16 ENCOUNTER — Encounter: Payer: Self-pay | Admitting: Family Medicine

## 2021-12-16 VITALS — BP 120/72 | HR 71 | Temp 97.7°F | Resp 16 | Ht 62.0 in | Wt 174.1 lb

## 2021-12-16 DIAGNOSIS — Z23 Encounter for immunization: Secondary | ICD-10-CM

## 2021-12-16 DIAGNOSIS — K449 Diaphragmatic hernia without obstruction or gangrene: Secondary | ICD-10-CM

## 2021-12-16 DIAGNOSIS — Z1231 Encounter for screening mammogram for malignant neoplasm of breast: Secondary | ICD-10-CM | POA: Diagnosis not present

## 2021-12-16 DIAGNOSIS — K760 Fatty (change of) liver, not elsewhere classified: Secondary | ICD-10-CM

## 2021-12-16 DIAGNOSIS — R0989 Other specified symptoms and signs involving the circulatory and respiratory systems: Secondary | ICD-10-CM

## 2021-12-16 DIAGNOSIS — Z1211 Encounter for screening for malignant neoplasm of colon: Secondary | ICD-10-CM

## 2021-12-16 DIAGNOSIS — Z0000 Encounter for general adult medical examination without abnormal findings: Secondary | ICD-10-CM

## 2021-12-16 DIAGNOSIS — Z124 Encounter for screening for malignant neoplasm of cervix: Secondary | ICD-10-CM

## 2022-01-02 ENCOUNTER — Encounter: Payer: Self-pay | Admitting: Urology

## 2022-01-02 ENCOUNTER — Ambulatory Visit (INDEPENDENT_AMBULATORY_CARE_PROVIDER_SITE_OTHER): Payer: BC Managed Care – PPO | Admitting: Urology

## 2022-01-02 VITALS — BP 111/78 | HR 76 | Ht 62.0 in | Wt 175.0 lb

## 2022-01-02 DIAGNOSIS — N200 Calculus of kidney: Secondary | ICD-10-CM

## 2022-01-02 DIAGNOSIS — Z87442 Personal history of urinary calculi: Secondary | ICD-10-CM | POA: Diagnosis not present

## 2022-01-02 NOTE — Progress Notes (Signed)
01/02/2022 9:25 AM   [name] [birth year] [id number]  Referring provider: Alba Cory, MD [address] [place],  [place] [address]  Chief Complaint  Patient presents with   Nephrolithiasis   Urologic history: 1.  Recurrent nephrolithiasis Ureteroscopic removal 5 mm left distal calculus 12/02/2021 CT with a punctate left renal calculus Has passed previous stones x 4 in the past   HPI: 52 y.o. female presents for a postop follow-up visit.  Had severe spasms after stent removal which resolved Stone analysis 100% calcium oxalate dihydrate   PMH: Past Medical History:  Diagnosis Date   Allergy    Anemia    Childhood   Arthritis    Back pain    bulging disc   Clinical depression    Eczema    Exercise-induced asthma    Fatigue    Headache    History of 2019 novel coronavirus disease (COVID-19)    a.) 02/03/2019; b.) 10/01/2020   History of chicken pox    History of hiatal hernia    History of kidney stones    History of RSV infection 11/23/2021   a.) Tx'd with 6 day prednisone taper + promethazine/DM cough syrup   Insomnia    Irregular menses    Ovarian cyst    Over weight    Rectus diastasis    Right hip pain    Umbilical hernia     Surgical History: Past Surgical History:  Procedure Laterality Date   CESAREAN SECTION  2003,2005,2008,2010   CYSTOSCOPY/URETEROSCOPY/HOLMIUM LASER/STENT PLACEMENT Left 12/02/2021   Procedure: CYSTOSCOPY/URETEROSCOPY/HOLMIUM LASER/STENT PLACEMENT;  Surgeon: Riki Altes, MD;  Location: [facility] ORS;  Service: Urology;  Laterality: Left;   FOOT SURGERY     neuroma left foot   MEDIAL PARTIAL KNEE REPLACEMENT Left    meniscal repair Left 11/2018   tummy tuck  05/31/2014   UMBILICAL HERNIA REPAIR  05/31/2014   VENTRAL HERNIA REPAIR  05/31/2014   WISDOM TOOTH EXTRACTION     not under sedation    Home Medications:  Allergies as of 01/02/2022       Reactions   Bactrim [sulfamethoxazole-trimethoprim]  Itching   Celebrex [celecoxib] Hives   Erythromycin Other (See Comments)   Severe abdominal pain   Penicillins Hives        Medication List        Accurate as of January 02, 2022  9:25 AM. If you have any questions, ask your nurse or doctor.          albuterol 108 (90 Base) MCG/ACT inhaler Commonly known as: VENTOLIN HFA Inhale 2 puffs into the lungs every 6 (six) hours as needed for wheezing or shortness of breath.   clobetasol ointment 0.05 % Commonly known as: TEMOVATE as needed.        Allergies:  Allergies  Allergen Reactions   Bactrim [Sulfamethoxazole-Trimethoprim] Itching   Celebrex [Celecoxib] Hives   Erythromycin Other (See Comments)    Severe abdominal pain   Penicillins Hives    Family History: Family History  Problem Relation Age of Onset   Cancer Mother 70       breast cancer   Osteoarthritis Mother    Broken bones Mother        Hip x2   Varicose Veins Mother    Hyperlipidemia Father    Hypertension Father    Heart attack Father    Hypertension Paternal Grandmother    Congenital heart disease Paternal Grandmother    Stroke Paternal Grandfather  Kidney disease Daughter    Hypercholesterolemia Brother    Colon cancer Maternal Grandmother    Heart disease Maternal Grandfather    Hypertension Sister    Migraines Sister    Thyroid disease Sister    GI problems Sister        Extra kink in her large intestine   Hypercholesterolemia Brother     Social History:  reports that she has never smoked. She has never used smokeless tobacco. She reports that she does not drink alcohol and does not use drugs.   Physical Exam: BP 111/78   Pulse 76   Ht 5\' 2"  (1.575 m)   Wt 175 lb (79.4 kg)   LMP 03/25/2020 (Approximate)   BMI 32.01 kg/m   Constitutional:  Alert and oriented, No acute distress. HEENT: [place] AT Respiratory: Normal respiratory effort, no increased work of breathing. Psychiatric: Normal mood and affect.    Assessment &  Plan:    1.  Recurrent nephrolithiasis Discussed general stone prevention guidelines versus metabolic evaluation She has elected general stone prevention guidelines and was given literature Discussed the cornerstone of stone prevention is drinking of water to keep urine output >2.5 L/day Dietary oxalate moderation and increase citrus intake was also discussed 61-month follow-up with KUB.  If any evidence of interval stone growth she would like to proceed with a metabolic evaluation   8-month, MD  [facility] [address] [place], [place] [phone]

## 2022-01-05 DIAGNOSIS — N200 Calculus of kidney: Secondary | ICD-10-CM | POA: Insufficient documentation

## 2022-01-05 DIAGNOSIS — Z1231 Encounter for screening mammogram for malignant neoplasm of breast: Secondary | ICD-10-CM | POA: Diagnosis not present

## 2022-01-05 DIAGNOSIS — Z124 Encounter for screening for malignant neoplasm of cervix: Secondary | ICD-10-CM | POA: Diagnosis not present

## 2022-01-05 DIAGNOSIS — N941 Unspecified dyspareunia: Secondary | ICD-10-CM | POA: Insufficient documentation

## 2022-01-05 DIAGNOSIS — Z01419 Encounter for gynecological examination (general) (routine) without abnormal findings: Secondary | ICD-10-CM | POA: Diagnosis not present

## 2022-01-05 LAB — HM MAMMOGRAPHY

## 2022-01-13 ENCOUNTER — Encounter: Payer: Self-pay | Admitting: Family Medicine

## 2022-01-20 LAB — HM PAP SMEAR: HM Pap smear: NEGATIVE

## 2022-01-22 ENCOUNTER — Encounter: Payer: Self-pay | Admitting: Family Medicine

## 2022-03-02 DIAGNOSIS — M25572 Pain in left ankle and joints of left foot: Secondary | ICD-10-CM | POA: Diagnosis not present

## 2022-03-03 DIAGNOSIS — M25572 Pain in left ankle and joints of left foot: Secondary | ICD-10-CM | POA: Diagnosis not present

## 2022-05-11 ENCOUNTER — Other Ambulatory Visit: Payer: Self-pay

## 2022-05-13 ENCOUNTER — Encounter: Payer: Self-pay | Admitting: Gastroenterology

## 2022-05-13 ENCOUNTER — Ambulatory Visit: Payer: BC Managed Care – PPO | Admitting: Gastroenterology

## 2022-05-13 ENCOUNTER — Other Ambulatory Visit: Payer: Self-pay

## 2022-05-13 VITALS — BP 120/74 | HR 67 | Temp 98.0°F | Ht 62.0 in | Wt 183.8 lb

## 2022-05-13 DIAGNOSIS — K449 Diaphragmatic hernia without obstruction or gangrene: Secondary | ICD-10-CM | POA: Diagnosis not present

## 2022-05-13 DIAGNOSIS — K760 Fatty (change of) liver, not elsewhere classified: Secondary | ICD-10-CM | POA: Diagnosis not present

## 2022-05-13 DIAGNOSIS — R140 Abdominal distension (gaseous): Secondary | ICD-10-CM | POA: Diagnosis not present

## 2022-05-13 DIAGNOSIS — Z1211 Encounter for screening for malignant neoplasm of colon: Secondary | ICD-10-CM

## 2022-05-13 MED ORDER — OMEPRAZOLE 40 MG PO CPDR: 40.0000 mg | DELAYED_RELEASE_CAPSULE | Freq: Every day | ORAL | 3 refills | Status: DC

## 2022-05-13 MED ORDER — NA SULFATE-K SULFATE-MG SULF 17.5-3.13-1.6 GM/177ML PO SOLN: 354.0000 mL | Freq: Once | ORAL | 0 refills | Status: AC

## 2022-05-13 NOTE — Addendum Note (Signed)
Addended by: Adela Ports on: 05/13/2022 01:37 PM   Modules accepted: Orders

## 2022-05-13 NOTE — Patient Instructions (Addendum)
Low-FODMAP Eating Plan  FODMAP stands for fermentable oligosaccharides, disaccharides, monosaccharides, and polyols. These are sugars that are hard for some people to digest. A low-FODMAP eating plan may help some people who have irritable bowel syndrome (IBS) and certain other bowel (intestinal) diseases to manage their symptoms. This meal plan can be complicated to follow. Work with a diet and nutrition specialist (dietitian) to make a low-FODMAP eating plan that is right for you. A dietitian can help make sure that you get enough nutrition from this diet. What are tips for following this plan? Reading food labels Check labels for hidden FODMAPs such as: High-fructose syrup. Honey. Agave. Natural fruit flavors. Onion or garlic powder. Choose low-FODMAP foods that contain 3-4 grams of fiber per serving. Check food labels for serving sizes. Eat only one serving at a time to make sure FODMAP levels stay low. Shopping Shop with a list of foods that are recommended on this diet and make a meal plan. Meal planning Follow a low-FODMAP eating plan for up to 6 weeks, or as told by your health care provider or dietitian. To follow the eating plan: Eliminate high-FODMAP foods from your diet completely. Choose only low-FODMAP foods to eat. You will do this for 2-6 weeks. Gradually reintroduce high-FODMAP foods into your diet one at a time. Most people should wait a few days before introducing the next new high-FODMAP food into their meal plan. Your dietitian can recommend how quickly you may reintroduce foods. Keep a daily record of what and how much you eat and drink. Make note of any symptoms that you have after eating. Review your daily record with a dietitian regularly to identify which foods you can eat and which foods you should avoid. General tips Drink enough fluid each day to keep your urine pale yellow. Avoid processed foods. These often have added sugar and may be high in FODMAPs. Avoid  most dairy products, whole grains, and sweeteners. Work with a dietitian to make sure you get enough fiber in your diet. Avoid high FODMAP foods at meals to manage symptoms. Recommended foods Fruits Bananas, oranges, tangerines, lemons, limes, blueberries, raspberries, strawberries, grapes, cantaloupe, honeydew melon, kiwi, papaya, passion fruit, and pineapple. Limited amounts of dried cranberries, banana chips, and shredded coconut. Vegetables Eggplant, zucchini, cucumber, peppers, green beans, bean sprouts, lettuce, arugula, kale, Swiss chard, spinach, collard greens, bok choy, summer squash, potato, and tomato. Limited amounts of corn, carrot, and sweet potato. Green parts of scallions. Grains Gluten-free grains, such as rice, oats, buckwheat, quinoa, corn, polenta, and millet. Gluten-free pasta, bread, or cereal. Rice noodles. Corn tortillas. Meats and other proteins Unseasoned beef, pork, poultry, or fish. Eggs. Bacon. Tofu (firm) and tempeh. Limited amounts of nuts and seeds, such as almonds, walnuts, brazil nuts, pecans, peanuts, nut butters, pumpkin seeds, chia seeds, and sunflower seeds. Dairy Lactose-free milk, yogurt, and kefir. Lactose-free cottage cheese and ice cream. Non-dairy milks, such as almond, coconut, hemp, and rice milk. Non-dairy yogurt. Limited amounts of goat cheese, brie, mozzarella, parmesan, swiss, and other hard cheeses. Fats and oils Butter-free spreads. Vegetable oils, such as olive, canola, and sunflower oil. Seasoning and other foods Artificial sweeteners with names that do not end in "ol," such as aspartame, saccharine, and stevia. Maple syrup, white table sugar, raw sugar, brown sugar, and molasses. Mayonnaise, soy sauce, and tamari. Fresh basil, coriander, parsley, rosemary, and thyme. Beverages Water and mineral water. Sugar-sweetened soft drinks. Small amounts of orange juice or cranberry juice. Black and green tea. Most dry wines.   Coffee. The items listed  above may not be a complete list of foods and beverages you can eat. Contact a dietitian for more information. Foods to avoid Fruits Fresh, dried, and juiced forms of apple, pear, watermelon, peach, plum, cherries, apricots, blackberries, boysenberries, figs, nectarines, and mango. Avocado. Vegetables Chicory root, artichoke, asparagus, cabbage, snow peas, Brussels sprouts, broccoli, sugar snap peas, mushrooms, celery, and cauliflower. Onions, garlic, leeks, and the white part of scallions. Grains Wheat, including kamut, durum, and semolina. Barley and bulgur. Couscous. Wheat-based cereals. Wheat noodles, bread, crackers, and pastries. Meats and other proteins Fried or fatty meat. Sausage. Cashews and pistachios. Soybeans, baked beans, black beans, chickpeas, kidney beans, fava beans, navy beans, lentils, black-eyed peas, and split peas. Dairy Milk, yogurt, ice cream, and soft cheese. Cream and sour cream. Milk-based sauces. Custard. Buttermilk. Soy milk. Seasoning and other foods Any sugar-free gum or candy. Foods that contain artificial sweeteners such as sorbitol, mannitol, isomalt, or xylitol. Foods that contain honey, high-fructose corn syrup, or agave. Bouillon, vegetable stock, beef stock, and chicken stock. Garlic and onion powder. Condiments made with onion, such as hummus, chutney, pickles, relish, salad dressing, and salsa. Tomato paste. Beverages Chicory-based drinks. Coffee substitutes. Chamomile tea. Fennel tea. Sweet or fortified wines such as port or sherry. Diet soft drinks made with isomalt, mannitol, maltitol, sorbitol, or xylitol. Apple, pear, and mango juice. Juices with high-fructose corn syrup. The items listed above may not be a complete list of foods and beverages you should avoid. Contact a dietitian for more information. Summary FODMAP stands for fermentable oligosaccharides, disaccharides, monosaccharides, and polyols. These are sugars that are hard for some people to  digest. A low-FODMAP eating plan is a short-term diet that helps to ease symptoms of certain bowel diseases. The eating plan usually lasts up to 6 weeks. After that, high-FODMAP foods are reintroduced gradually and one at a time. This can help you find out which foods may be causing symptoms. A low-FODMAP eating plan can be complicated. It is best to work with a dietitian who has experience with this type of plan. This information is not intended to replace advice given to you by your health care provider. Make sure you discuss any questions you have with your health care provider. Document Revised: 05/25/2019 Document Reviewed: 05/25/2019 Elsevier Patient Education  2023 [employer]   Food Choices for Gastroesophageal Reflux Disease, Adult When you have gastroesophageal reflux disease (GERD), the foods you eat and your eating habits are very important. Choosing the right foods can help ease your discomfort. Think about working with a food expert (dietitian) to help you make good choices. What are tips for following this plan? Reading food labels Look for foods that are low in saturated fat. Foods that may help with your symptoms include: Foods that have less than 5% of daily value (DV) of fat. Foods that have 0 grams of trans fat. Cooking Do not fry your food. Cook your food by baking, steaming, grilling, or broiling. These are all methods that do not need a lot of fat for cooking. To add flavor, try to use herbs that are low in spice and acidity. Meal planning  Choose healthy foods that are low in fat, such as: Fruits and vegetables. Whole grains. Low-fat dairy products. Lean meats, fish, and poultry. Eat small meals often instead of eating 3 large meals each day. Eat your meals slowly in a place where you are relaxed. Avoid bending over or lying down until 2-3 hours after eating.  Limit high-fat foods such as fatty meats or fried foods. Limit your intake of fatty foods, such as  oils, butter, and shortening. Avoid the following as told by your doctor: Foods that cause symptoms. These may be different for different people. Keep a food diary to keep track of foods that cause symptoms. Alcohol. Drinking a lot of liquid with meals. Eating meals during the 2-3 hours before bed. Lifestyle Stay at a healthy weight. Ask your doctor what weight is healthy for you. If you need to lose weight, work with your doctor to do so safely. Exercise for at least 30 minutes on 5 or more days each week, or as told by your doctor. Wear loose-fitting clothes. Do not smoke or use any products that contain nicotine or tobacco. If you need help quitting, ask your doctor. Sleep with the head of your bed higher than your feet. Use a wedge under the mattress or blocks under the bed frame to raise the head of the bed. Chew sugar-free gum after meals. What foods should eat?  Eat a healthy, well-balanced diet of fruits, vegetables, whole grains, low-fat dairy products, lean meats, fish, and poultry. Each person is different. Foods that may cause symptoms in one person may not cause any symptoms in another person. Work with your doctor to find foods that are safe for you. The items listed above may not be a complete list of what you can eat and drink. Contact a food expert for more options. What foods should I avoid? Limiting some of these foods may help in managing the symptoms of GERD. Everyone is different. Talk with a food expert or your doctor to help you find the exact foods to avoid, if any. Fruits Any fruits prepared with added fat. Any fruits that cause symptoms. For some people, this may include citrus fruits, such as oranges, grapefruit, pineapple, and lemons. Vegetables Deep-fried vegetables. [place] fries. Any vegetables prepared with added fat. Any vegetables that cause symptoms. For some people, this may include tomatoes and tomato products, chili peppers, onions and garlic, and  horseradish. Grains Pastries or quick breads with added fat. Meats and other proteins High-fat meats, such as fatty beef or pork, hot dogs, ribs, ham, sausage, salami, and bacon. Fried meat or protein, including fried fish and fried chicken. Nuts and nut butters, in large amounts. Dairy Whole milk and chocolate milk. Sour cream. Cream. Ice cream. Cream cheese. Milkshakes. Fats and oils Butter. Margarine. Shortening. Ghee. Beverages Coffee and tea, with or without caffeine. Carbonated beverages. Sodas. Energy drinks. Fruit juice made with acidic fruits, such as orange or grapefruit. Tomato juice. Alcoholic drinks. Sweets and desserts Chocolate and cocoa. Donuts. Seasonings and condiments Pepper. Peppermint and spearmint. Added salt. Any condiments, herbs, or seasonings that cause symptoms. For some people, this may include curry, hot sauce, or vinegar-based salad dressings. The items listed above may not be a complete list of what you should not eat and drink. Contact a food expert for more options. Questions to ask your doctor Diet and lifestyle changes are often the first steps that are taken to manage symptoms of GERD. If diet and lifestyle changes do not help, talk with your doctor about taking medicines. Where to find more information [employer] G[employer]: aboutgerd.org Summary When you have GERD, food and lifestyle choices are very important in easing your symptoms. Eat small meals often instead of 3 large meals a day. Eat your meals slowly and in a place where you are relaxed. Avoid  bending over or lying down until 2-3 hours after eating. Limit high-fat foods such as fatty meats or fried foods. This information is not intended to replace advice given to you by your health care provider. Make sure you discuss any questions you have with your health care provider. Document Revised: 07/17/2019 Document Reviewed: 07/17/2019 Elsevier Patient Education   2023 [employer].

## 2022-05-13 NOTE — Progress Notes (Signed)
Gina Mood MD, MRCP(U.K) [address]  [address]  [place], [place] [address]  Main: [phone]  Fax: [phone]   Gastroenterology Consultation  Referring Provider:     Alba Cory, MD Primary Care Physician:  Alba Cory, MD Primary Gastroenterologist:  Dr. Wyline Ellison  Reason for Consultation: Colon cancer screening, early satiety, choking, indigestion        HPI:   [name] is a 52 y.o. y/o female referred for consultation & management  by Dr. Carlynn Purl, Danna Hefty, MD.    She is here today to see me for issues with swallowing.  For more than a few months she has had issues with solids going down not too much with liquids.  Denies any history of impactions.  Some history of heartburn.  She has gained weight recently and has seen that her symptoms have correlated with the weight gain.  Denies any family history of esophageal cancer.  She is not a smoker.  She is not any PPIs.  She has never had a colonoscopy.  No family of colon cancer or polyps.  She feels bloated after she eats often has a bowel movement right after she eats denies any diarrhea.  Denies any use of artificial sugars or sweeteners.  No sweet tea or sodas.  She was seen over 4 years back by Dr. Allegra Lai for dysphagia EGD was recommended but appears that the patient did not go through the procedure.  Recently underwent in November 2023 a CT scan for renal stones found to have mild left hydro ureteronephrosis small hiatal hernia and possibly mild hepatic steatosis.  No recent LFTs.  Creatinine was 0.92 in 2023.  Hemoglobin 14.9.     Past Medical History:  Diagnosis Date   Allergy    Anemia    Childhood   Arthritis    Back pain    bulging disc   Clinical depression    Eczema    Exercise-induced asthma    Fatigue    Headache    History of 2019 novel coronavirus disease (COVID-19)    a.) 02/03/2019; b.) 10/01/2020   History of chicken pox    History of hiatal hernia    History of kidney  stones    History of RSV infection 11/23/2021   a.) Tx'd with 6 day prednisone taper + promethazine/DM cough syrup   Insomnia    Irregular menses    Ovarian cyst    Over weight    Rectus diastasis    Right hip pain    Umbilical hernia     Past Surgical History:  Procedure Laterality Date   CESAREAN SECTION  2003,2005,2008,2010   CYSTOSCOPY/URETEROSCOPY/HOLMIUM LASER/STENT PLACEMENT Left 12/02/2021   Procedure: CYSTOSCOPY/URETEROSCOPY/HOLMIUM LASER/STENT PLACEMENT;  Surgeon: Riki Altes, MD;  Location: [facility];  Service: Urology;  Laterality: Left;   FOOT SURGERY     neuroma left foot   MEDIAL PARTIAL KNEE REPLACEMENT Left    meniscal repair Left 11/2018   tummy tuck  05/31/2014   UMBILICAL HERNIA REPAIR  05/31/2014   VENTRAL HERNIA REPAIR  05/31/2014   WISDOM TOOTH EXTRACTION     not under sedation    Prior to Admission medications   Medication Sig Start Date End Date Taking? Authorizing Provider  albuterol (VENTOLIN HFA) 108 (90 Base) MCG/ACT inhaler Inhale 2 puffs into the lungs every 6 (six) hours as needed for wheezing or shortness of breath. 11/23/21   Lorre Munroe, NP  clobetasol ointment (TEMOVATE) 0.05 % as  needed. 09/11/14   [provider]    Family History  Problem Relation Age of Onset   Cancer Mother 41       breast cancer   Osteoarthritis Mother    Broken bones Mother        Hip x2   Varicose Veins Mother    Hyperlipidemia Father    Hypertension Father    Heart attack Father    Hypertension Paternal Grandmother    Congenital heart disease Paternal Grandmother    Stroke Paternal Grandfather    Kidney disease Daughter    Hypercholesterolemia Brother    Colon cancer Maternal Grandmother    Heart disease Maternal Grandfather    Hypertension Sister    Migraines Sister    Thyroid disease Sister    GI problems Sister        Extra kink in her large intestine   Hypercholesterolemia Brother      Social History   Tobacco Use   Smoking  status: Never   Smokeless tobacco: Never  Vaping Use   Vaping Use: Never used  Substance Use Topics   Alcohol use: No   Drug use: No    Allergies as of 05/13/2022 - Review Complete 05/13/2022  Allergen Reaction Noted   Bactrim [sulfamethoxazole-trimethoprim] Itching 03/23/2021   Celebrex [celecoxib] Hives 11/23/2021   Erythromycin Other (See Comments) 07/01/2011   Penicillins Hives 07/01/2011    Review of Systems:    All systems reviewed and negative except where noted in HPI.   Physical Exam:  BP 120/74   Pulse 67   Temp 98 F (36.7 C)   Ht 5\' 2"  (1.575 m)   Wt 183 lb 12.8 oz (83.4 kg)   LMP 03/25/2020 (Approximate)   BMI 33.62 kg/m  Patient's last menstrual period was 03/25/2020 (approximate). Psych:  Alert and cooperative. Normal Ellison and affect. General:   Alert,  Well-developed, well-nourished, pleasant and cooperative in NAD Head:  Normocephalic and atraumatic. Eyes:  Sclera clear, no icterus.   Conjunctiva pink. Ears:  Normal auditory acuity.   Neurologic:  Alert and oriented x3;  grossly normal neurologically. Psych:  Alert and cooperative. Normal Ellison and affect.  Imaging Studies: No results found.  Assessment and Plan:   NARI VANNATTER is a 52 y.o. y/o female has been referred for issues of choking, indigestion, colon cancer screening, hiatal hernia and hepatic steatosis.   Plan 1.  Colon cancer screening schedule colonoscopy along with upper endoscopy with history of indigestion and dysphagia 2.  Check TSH due to history of weight gain 3.  Discussed about hiatal hernia and its effects on acid reflux, discussed if she has symptoms to time her meals adequately have small meals rather large meals at night keeping in the bed elevated.  Commence her on Prilosec 40 mg once a day to empirically treat any reflux and any reflux related esophageal spasms.  If at next visit she has not had any benefit with the Prilosec we can stop it. 4.  Hepatic steatosis will  obtain liver function tests and if normal the treatment would be to lose weight, exercise eat healthy if LFTs are abnormal will require further evaluation.  Given her patient information about nonalcoholic fatty liver disease and Mediterranean diet. 5.  She will let me know on the day of her colonoscopy if her bloating has resolved.  If it has resolved very likely related to constipation if not resolved likely related to gas production.  Options would include treatment for SIBO  versus low FODMAP diet versus trial of charcoal tablets  I have discussed alternative options, risks & benefits,  which include, but are not limited to, bleeding, infection, perforation,respiratory complication & drug reaction.  The patient agrees with this plan & written consent will be obtained.     Follow up in 3 to 4 months with APP  Dr Gina Mood MD,MRCP(U.K)

## 2022-05-14 LAB — HEPATIC FUNCTION PANEL
ALT: 20 IU/L (ref 0–32)
AST: 21 IU/L (ref 0–40)
Albumin: 4.2 g/dL (ref 3.8–4.9)
Alkaline Phosphatase: 107 IU/L (ref 44–121)
Bilirubin Total: 0.4 mg/dL (ref 0.0–1.2)
Bilirubin, Direct: 0.12 mg/dL (ref 0.00–0.40)
Total Protein: 6.6 g/dL (ref 6.0–8.5)

## 2022-05-14 LAB — TSH: TSH: 1.48 u[IU]/mL (ref 0.450–4.500)

## 2022-05-28 ENCOUNTER — Ambulatory Visit: Payer: BC Managed Care – PPO | Admitting: Gastroenterology

## 2022-06-02 ENCOUNTER — Encounter: Payer: Self-pay | Admitting: Gastroenterology

## 2022-06-02 NOTE — Anesthesia Preprocedure Evaluation (Signed)
Anesthesia Evaluation  Patient identified by MRN, date of birth, ID band Patient awake    Reviewed: Allergy & Precautions, H&P , NPO status , Patient's Chart, lab work & pertinent test results  Airway Mallampati: II  TM Distance: >3 FB Neck ROM: full    Dental no notable dental hx.    Pulmonary neg pulmonary ROS   Pulmonary exam normal        Cardiovascular negative cardio ROS Normal cardiovascular exam     Neuro/Psych negative neurological ROS  negative psych ROS   GI/Hepatic Neg liver ROS, hiatal hernia,,,  Endo/Other  negative endocrine ROS    Renal/GU negative Renal ROS  negative genitourinary   Musculoskeletal   Abdominal   Peds  Hematology negative hematology ROS (+)   Anesthesia Other Findings Past Medical History: No date: Allergy No date: Anemia     Comment:  Childhood No date: Arthritis No date: Back pain     Comment:  bulging disc No date: Clinical depression No date: Eczema No date: Exercise-induced asthma No date: Fatigue No date: Headache No date: History of 2019 novel coronavirus disease (COVID-19)     Comment:  a.) 02/03/2019; b.) 10/01/2020 No date: History of chicken pox No date: History of hiatal hernia No date: History of kidney stones 11/23/2021: History of RSV infection     Comment:  a.) Tx'd with 6 day prednisone taper + promethazine/DM               cough syrup No date: Insomnia No date: Irregular menses No date: Ovarian cyst No date: Over weight No date: Rectus diastasis No date: Right hip pain No date: Umbilical hernia  Past Surgical History: 2003,2005,2008,2010: CESAREAN SECTION 12/02/2021: CYSTOSCOPY/URETEROSCOPY/HOLMIUM LASER/STENT PLACEMENT;  Left     Comment:  Procedure: CYSTOSCOPY/URETEROSCOPY/HOLMIUM LASER/STENT               PLACEMENT;  Surgeon: Riki Altes, MD;  Location:               [facility];  Service: Urology;  Laterality: Left; No date: FOOT  SURGERY     Comment:  neuroma left foot No date: MEDIAL PARTIAL KNEE REPLACEMENT; Left 11/2018: meniscal repair; Left 05/31/2014: tummy tuck 05/31/2014: UMBILICAL HERNIA REPAIR 05/31/2014: VENTRAL HERNIA REPAIR No date: WISDOM TOOTH EXTRACTION     Comment:  not under sedation     Reproductive/Obstetrics negative OB ROS                              Anesthesia Physical Anesthesia Plan  ASA: 2  Anesthesia Plan: General   Post-op Pain Management:    Induction: Intravenous  PONV Risk Score and Plan: Propofol infusion and TIVA  Airway Management Planned: Natural Airway  Additional Equipment:   Intra-op Plan:   Post-operative Plan:   Informed Consent: I have reviewed the patients History and Physical, chart, labs and discussed the procedure including the risks, benefits and alternatives for the proposed anesthesia with the patient or authorized representative who has indicated his/her understanding and acceptance.     Dental Advisory Given  Plan Discussed with: CRNA and Surgeon  Anesthesia Plan Comments:          Anesthesia Quick Evaluation

## 2022-06-03 ENCOUNTER — Ambulatory Visit: Payer: BC Managed Care – PPO | Admitting: Anesthesiology

## 2022-06-03 ENCOUNTER — Encounter
Admission: RE | Disposition: A | Discharge status: Home / Self Care | Payer: Self-pay | Source: Home / Self Care | Attending: Gastroenterology

## 2022-06-03 ENCOUNTER — Ambulatory Visit
Admission: RE | Admit: 2022-06-03 | Discharge: 2022-06-03 | Disposition: A | Discharge status: Home / Self Care | Payer: BC Managed Care – PPO | Attending: Gastroenterology | Admitting: Gastroenterology

## 2022-06-03 DIAGNOSIS — R140 Abdominal distension (gaseous): Secondary | ICD-10-CM | POA: Insufficient documentation

## 2022-06-03 DIAGNOSIS — J45990 Exercise induced bronchospasm: Secondary | ICD-10-CM | POA: Diagnosis not present

## 2022-06-03 DIAGNOSIS — R1310 Dysphagia, unspecified: Secondary | ICD-10-CM | POA: Insufficient documentation

## 2022-06-03 DIAGNOSIS — M1990 Unspecified osteoarthritis, unspecified site: Secondary | ICD-10-CM | POA: Insufficient documentation

## 2022-06-03 DIAGNOSIS — Z87442 Personal history of urinary calculi: Secondary | ICD-10-CM | POA: Diagnosis not present

## 2022-06-03 DIAGNOSIS — K760 Fatty (change of) liver, not elsewhere classified: Secondary | ICD-10-CM | POA: Insufficient documentation

## 2022-06-03 DIAGNOSIS — K449 Diaphragmatic hernia without obstruction or gangrene: Secondary | ICD-10-CM | POA: Diagnosis not present

## 2022-06-03 DIAGNOSIS — R1319 Other dysphagia: Secondary | ICD-10-CM

## 2022-06-03 DIAGNOSIS — Z1211 Encounter for screening for malignant neoplasm of colon: Secondary | ICD-10-CM

## 2022-06-03 HISTORY — PX: COLONOSCOPY WITH PROPOFOL: SHX5780

## 2022-06-03 HISTORY — PX: ESOPHAGOGASTRODUODENOSCOPY (EGD) WITH PROPOFOL: SHX5813

## 2022-06-03 SURGERY — COLONOSCOPY WITH PROPOFOL
Anesthesia: General

## 2022-06-03 MED ORDER — PROPOFOL 10 MG/ML IV BOLUS
INTRAVENOUS | Status: DC | PRN
  Administered 2022-06-03: 80 mg via INTRAVENOUS
  Administered 2022-06-03: 20 mg via INTRAVENOUS

## 2022-06-03 MED ORDER — PROPOFOL 500 MG/50ML IV EMUL
INTRAVENOUS | Status: DC | PRN
  Administered 2022-06-03: 150 ug/kg/min via INTRAVENOUS

## 2022-06-03 MED ORDER — LIDOCAINE HCL (CARDIAC) PF 100 MG/5ML IV SOSY
PREFILLED_SYRINGE | INTRAVENOUS | Status: DC | PRN
  Administered 2022-06-03: 50 mg via INTRAVENOUS

## 2022-06-03 MED ORDER — SODIUM CHLORIDE 0.9 % IV SOLN
INTRAVENOUS | Status: DC
  Administered 2022-06-03: 20 mL/h via INTRAVENOUS

## 2022-06-03 NOTE — Op Note (Signed)
[facility] Patient Name: [name] Procedure Date: 06/03/2022 9:34 AM MRN: [id number] Account #: [id number] Date of Birth: 04-20-1970 Admit Type: Outpatient Age: 52 Room: 3 Gender: Female Note Status: Finalized Instrument Name: Upper Endoscope [id number] Procedure:             Upper GI endoscopy Indications:           Dysphagia Providers:             Wyline Mood MD, MD Medicines:             Monitored Anesthesia Care Complications:         No immediate complications. Procedure:             Pre-Anesthesia Assessment:                        - Prior to the procedure, a History and Physical was                         performed, and patient medications, allergies and                         sensitivities were reviewed. The patient's tolerance                         of previous anesthesia was reviewed.                        - The risks and benefits of the procedure and the                         sedation options and risks were discussed with the                         patient. All questions were answered and informed                         consent was obtained.                        - ASA Grade Assessment: II - A patient with mild                         systemic disease.                        After obtaining informed consent, the endoscope was                         passed under direct vision. Throughout the procedure,                         the patient's blood pressure, pulse, and oxygen                         saturations were monitored continuously. The Endoscope                         was introduced through the mouth, and advanced to the  third part of duodenum. The upper GI endoscopy was                         accomplished with ease. The patient tolerated the                         procedure well. Findings:      The stomach was normal.      The examined duodenum was normal.      The examined esophagus was  normal. Biopsies were taken with a cold       forceps for histology. Impression:            - Normal stomach.                        - Normal examined duodenum.                        - Normal esophagus. Biopsied. Recommendation:        - Await pathology results.                        - Perform a colonoscopy today. Procedure Code(s):     --- Professional ---                        [phone], Esophagogastroduodenoscopy, flexible,                         transoral; with biopsy, single or multiple Diagnosis Code(s):     --- Professional ---                        R13.10, Dysphagia, unspecified CPT copyright 2022 American Medical Association. All rights reserved. The codes documented in this report are preliminary and upon coder review may  be revised to meet current compliance requirements. Wyline Mood, MD Wyline Mood MD, MD 06/03/2022 9:52:37 AM This report has been signed electronically. Number of Addenda: 0 Note Initiated On: 06/03/2022 9:34 AM Estimated Blood Loss:  Estimated blood loss: none.      [facility]

## 2022-06-03 NOTE — Anesthesia Procedure Notes (Signed)
Date/Time: 06/03/2022 9:45 AM  Performed by: Ginger Carne, CRNAPre-anesthesia Checklist: Patient identified, Emergency Drugs available, Suction available, Patient being monitored and Timeout performed Patient Re-evaluated:Patient Re-evaluated prior to induction Oxygen Delivery Method: Nasal cannula Preoxygenation: Pre-oxygenation with 100% oxygen

## 2022-06-03 NOTE — Transfer of Care (Signed)
Immediate Anesthesia Transfer of Care Note  Patient: [name]  Procedure(s) Performed: COLONOSCOPY WITH PROPOFOL ESOPHAGOGASTRODUODENOSCOPY (EGD) WITH PROPOFOL  Patient Location: Endoscopy Unit  Anesthesia Type:General  Level of Consciousness: sedated and responds to stimulation  Airway & Oxygen Therapy: Patient Spontanous Breathing  Post-op Assessment: Report given to RN and Post -op Vital signs reviewed and stable  Post vital signs: Reviewed and stable  Last Vitals:  Vitals Value Taken Time  BP 93/50 06/03/22 1008  Temp 36.2 C 06/03/22 1008  Pulse 60 06/03/22 1008  Resp 15 06/03/22 1008  SpO2 96 % 06/03/22 1008  Vitals shown include unvalidated device data.  Last Pain:  Vitals:   06/03/22 1008  TempSrc: Temporal  PainSc:          Complications: No notable events documented.

## 2022-06-03 NOTE — Anesthesia Postprocedure Evaluation (Signed)
Anesthesia Post Note  Patient: [name]  Procedure(s) Performed: COLONOSCOPY WITH PROPOFOL ESOPHAGOGASTRODUODENOSCOPY (EGD) WITH PROPOFOL  Patient location during evaluation: Endoscopy Anesthesia Type: General Level of consciousness: awake and alert Pain management: pain level controlled Vital Signs Assessment: post-procedure vital signs reviewed and stable Respiratory status: spontaneous breathing, nonlabored ventilation and respiratory function stable Cardiovascular status: blood pressure returned to baseline and stable Postop Assessment: no apparent nausea or vomiting Anesthetic complications: no   No notable events documented.   Last Vitals:  Vitals:   06/03/22 1018 06/03/22 1028  BP: 118/69 (!) 112/58  Pulse:    Resp:    Temp:    SpO2:      Last Pain:  Vitals:   06/03/22 1028  TempSrc:   PainSc: 0-No pain                 Foye Deer

## 2022-06-03 NOTE — H&P (Signed)
Gina Mood, MD [address], [place], [place], [address] [address], [address], [place], [place], [address] Phone: [phone]  Fax: [phone]  Primary Care Physician:  Gina Cory, MD   Pre-Procedure History & Physical: HPI:  [name] is a 52 y.o. female is here for an endoscopy and colonoscopy    Past Medical History:  Diagnosis Date   Allergy    Anemia    Childhood   Arthritis    Back pain    bulging disc   Clinical depression    Eczema    Exercise-induced asthma    Fatigue    Headache    History of 2019 novel coronavirus disease (COVID-19)    a.) 02/03/2019; b.) 10/01/2020   History of chicken pox    History of hiatal hernia    History of kidney stones    History of RSV infection 11/23/2021   a.) Tx'd with 6 day prednisone taper + promethazine/DM cough syrup   Insomnia    Irregular menses    Ovarian cyst    Over weight    Rectus diastasis    Right hip pain    Umbilical hernia     Past Surgical History:  Procedure Laterality Date   CESAREAN SECTION  2003,2005,2008,2010   CYSTOSCOPY/URETEROSCOPY/HOLMIUM LASER/STENT PLACEMENT Left 12/02/2021   Procedure: CYSTOSCOPY/URETEROSCOPY/HOLMIUM LASER/STENT PLACEMENT;  Surgeon: Riki Altes, MD;  Location: [facility] ORS;  Service: Urology;  Laterality: Left;   FOOT SURGERY     neuroma left foot   MEDIAL PARTIAL KNEE REPLACEMENT Left    meniscal repair Left 11/2018   tummy tuck  05/31/2014   UMBILICAL HERNIA REPAIR  05/31/2014   VENTRAL HERNIA REPAIR  05/31/2014   WISDOM TOOTH EXTRACTION     not under sedation    Prior to Admission medications   Medication Sig Start Date End Date Taking? Authorizing Provider  albuterol (VENTOLIN HFA) 108 (90 Base) MCG/ACT inhaler Inhale 2 puffs into the lungs every 6 (six) hours as needed for wheezing or shortness of breath. 11/23/21   Lorre Munroe, NP  clobetasol ointment (TEMOVATE) 0.05 % as needed. 09/11/14   [provider]   omeprazole (PRILOSEC) 40 MG capsule Take 1 capsule (40 mg total) by mouth daily. 05/13/22   Gina Mood, MD    Allergies as of 05/13/2022 - Review Complete 05/13/2022  Allergen Reaction Noted   Bactrim [sulfamethoxazole-trimethoprim] Itching 03/23/2021   Celebrex [celecoxib] Hives 11/23/2021   Erythromycin Other (See Comments) 07/01/2011   Penicillins Hives 07/01/2011    Family History  Problem Relation Age of Onset   Cancer Mother 71       breast cancer   Osteoarthritis Mother    Broken bones Mother        Hip x2   Varicose Veins Mother    Hyperlipidemia Father    Hypertension Father    Heart attack Father    Hypertension Paternal Grandmother    Congenital heart disease Paternal Grandmother    Stroke Paternal Grandfather    Kidney disease Daughter    Hypercholesterolemia Brother    Colon cancer Maternal Grandmother    Heart disease Maternal Grandfather    Hypertension Sister    Migraines Sister    Thyroid disease Sister    GI problems Sister        Extra kink in her large intestine   Hypercholesterolemia Brother     Social History   Socioeconomic History   Marital status: Married  Spouse name: [name]   Number of children: 4   Years of education: Not on file   Highest education level: Doctorate  Occupational History   Occupation: Professor    Employer: [employer]    Comment: [employer]  Tobacco Use   Smoking status: Never   Smokeless tobacco: Never  Vaping Use   Vaping Use: Never used  Substance and Sexual Activity   Alcohol use: No   Drug use: No   Sexual activity: Yes    Partners: Male    Birth control/protection: Rhythm    Comment: married   Other Topics Concern   Not on file  Social History Narrative   Married, she has 4 daughters    She works as Network engineer at [employer], and is on Research scientist (medical) role and teaching   Social Determinants of Corporate investment banker Strain: Low Risk  (12/16/2021)   Overall Financial Resource Strain (CARDIA)     Difficulty of Paying Living Expenses: Not hard at all  Food Insecurity: No Food Insecurity (12/16/2021)   Hunger Vital Sign    Worried About Running Out of Food in the Last Year: Never true    Ran Out of Food in the Last Year: Never true  Transportation Needs: No Transportation Needs (12/16/2021)   PRAPARE - Administrator, Civil Service (Medical): No    Lack of Transportation (Non-Medical): No  Physical Activity: Sufficiently Active (12/16/2021)   Exercise Vital Sign    Days of Exercise per Week: 4 days    Minutes of Exercise per Session: 60 min  Recent Concern: Physical Activity - Insufficiently Active (12/16/2021)   Exercise Vital Sign    Days of Exercise per Week: 4 days    Minutes of Exercise per Session: 30 min  Stress: No Stress Concern Present (12/16/2021)   [employer] of Occupational Health - Occupational Stress Questionnaire    Feeling of Stress : Not at all  Social Connections: Socially Integrated (12/16/2021)   Social Connection and Isolation Panel [NHANES]    Frequency of Communication with Friends and Family: Three times a week    Frequency of Social Gatherings with Friends and Family: Once a week    Attends Religious Services: More than 4 times per year    Active Member of [employer] or Organizations: Yes    Attends Engineer, structural: More than 4 times per year    Marital Status: Married  Catering manager Violence: Not At Risk (12/16/2021)   Humiliation, Afraid, Rape, and Kick questionnaire    Fear of Current or Ex-Partner: No    Emotionally Abused: No    Physically Abused: No    Sexually Abused: No    Review of Systems: See HPI, otherwise negative ROS  Physical Exam: LMP 03/25/2020 (Approximate)  General:   Alert,  pleasant and cooperative in NAD Head:  Normocephalic and atraumatic. Neck:  Supple; no masses or thyromegaly. Lungs:  Clear throughout to auscultation, normal respiratory effort.    Heart:  +S1, +S2, Regular rate and  rhythm, No edema. Abdomen:  Soft, nontender and nondistended. Normal bowel sounds, without guarding, and without rebound.   Neurologic:  Alert and  oriented x4;  grossly normal neurologically.  Impression/Plan: [name] is here for an endoscopy and colonoscopy  to be performed for  evaluation of dysphagia and colon cancer screening     Risks, benefits, limitations, and alternatives regarding endoscopy have been reviewed with the patient.  Questions have been answered.  All parties  agreeable.   Gina Mood, MD  06/03/2022, 8:56 AM

## 2022-06-03 NOTE — Op Note (Signed)
[facility] Patient Name: [name] Procedure Date: 06/03/2022 9:34 AM MRN: [id number] Account #: [id number] Date of Birth: Jan 29, 1970 Admit Type: Outpatient Age: 52 Room: [facility] ENDO ROOM 3 Gender: Female Note Status: Finalized Instrument Name: Nelda Marseille [id number] Procedure:             Colonoscopy Indications:           Screening for colorectal malignant neoplasm Providers:             Wyline Mood MD, MD Medicines:             Monitored Anesthesia Care Complications:         No immediate complications. Procedure:             Pre-Anesthesia Assessment:                        - Prior to the procedure, a History and Physical was                         performed, and patient medications, allergies and                         sensitivities were reviewed. The patient's tolerance                         of previous anesthesia was reviewed.                        - The risks and benefits of the procedure and the                         sedation options and risks were discussed with the                         patient. All questions were answered and informed                         consent was obtained.                        - After reviewing the risks and benefits, the patient                         was deemed in satisfactory condition to undergo the                         procedure.                        - ASA Grade Assessment: II - A patient with mild                         systemic disease.                        After obtaining informed consent, the colonoscope was                         passed under direct vision. Throughout the procedure,  the patient's blood pressure, pulse, and oxygen                         saturations were monitored continuously. The                         Colonoscope was introduced through the anus and                         advanced to the the cecum, identified by the                          appendiceal orifice. The colonoscopy was performed                         with ease. The patient tolerated the procedure well.                         The quality of the bowel preparation was excellent.                         The ileocecal valve, appendiceal orifice, and rectum                         were photographed. Findings:      The entire examined colon appeared normal on direct and retroflexion       views. Impression:            - The entire examined colon is normal on direct and                         retroflexion views.                        - No specimens collected. Recommendation:        - Discharge patient to home (with escort).                        - Resume previous diet.                        - Continue present medications.                        - Repeat colonoscopy in 10 years for screening                         purposes. Procedure Code(s):     --- Professional ---                        [phone], Colonoscopy, flexible; diagnostic, including                         collection of specimen(s) by brushing or washing, when                         performed (separate procedure) Diagnosis Code(s):     --- Professional ---  Z12.11, Encounter for screening for malignant neoplasm                         of colon CPT copyright 2022 American Medical Association. All rights reserved. The codes documented in this report are preliminary and upon coder review may  be revised to meet current compliance requirements. Wyline Mood, MD Wyline Mood MD, MD 06/03/2022 10:05:24 AM This report has been signed electronically. Number of Addenda: 0 Note Initiated On: 06/03/2022 9:34 AM Scope Withdrawal Time: 0 hours 8 minutes 43 seconds  Total Procedure Duration: 0 hours 10 minutes 33 seconds  Estimated Blood Loss:  Estimated blood loss: none.      [facility]

## 2022-06-04 ENCOUNTER — Encounter: Payer: Self-pay | Admitting: Gastroenterology

## 2022-06-04 LAB — SURGICAL PATHOLOGY

## 2022-07-03 ENCOUNTER — Ambulatory Visit: Payer: BC Managed Care – PPO | Admitting: Urology

## 2022-07-03 ENCOUNTER — Encounter: Payer: Self-pay | Admitting: Urology

## 2022-07-03 ENCOUNTER — Ambulatory Visit
Admission: RE | Admit: 2022-07-03 | Discharge: 2022-07-03 | Disposition: A | Discharge status: Home / Self Care | Payer: BC Managed Care – PPO | Source: Ambulatory Visit | Attending: Urology | Admitting: Urology

## 2022-07-03 ENCOUNTER — Ambulatory Visit
Admission: RE | Admit: 2022-07-03 | Discharge: 2022-07-03 | Disposition: A | Discharge status: Home / Self Care | Payer: BC Managed Care – PPO | Attending: Urology | Admitting: Urology

## 2022-07-03 VITALS — BP 123/85 | HR 83 | Ht 62.0 in | Wt 178.0 lb

## 2022-07-03 DIAGNOSIS — N200 Calculus of kidney: Secondary | ICD-10-CM | POA: Insufficient documentation

## 2022-07-03 NOTE — Progress Notes (Signed)
I, [name],acting as a scribe for Riki Altes, MD.,have documented all relevant documentation on the behalf of Riki Altes, MD,as directed by  Riki Altes, MD while in the presence of Riki Altes, MD.  07/03/2022 10:40 AM   [name] 10-10-1970 [id number]  Referring provider: Alba Cory, MD [address] [place],  [place] [address]  Chief Complaint  Patient presents with   Nephrolithiasis   Urologic history: 1.  Recurrent nephrolithiasis Ureteroscopic removal 5 mm left distal calculus 12/02/2021 CT with a punctate left renal calculus Has passed previous stones x 4 in the past  HPI: [name] is a 52 y.o. female presents for a 6 month follow up for nephrolithiasis.  Doing well since last visit No bothersome LUTS Denies dysuria, gross hematuria Denies flank, abdominal or pelvic pain    PMH: Past Medical History:  Diagnosis Date   Allergy    Anemia    Childhood   Arthritis    Back pain    bulging disc   Clinical depression    Eczema    Exercise-induced asthma    Fatigue    Headache    History of 2019 novel coronavirus disease (COVID-19)    a.) 02/03/2019; b.) 10/01/2020   History of chicken pox    History of hiatal hernia    History of kidney stones    History of RSV infection 11/23/2021   a.) Tx'd with 6 day prednisone taper + promethazine/DM cough syrup   Insomnia    Irregular menses    Ovarian cyst    Over weight    Rectus diastasis    Right hip pain    Umbilical hernia     Surgical History: Past Surgical History:  Procedure Laterality Date   CESAREAN SECTION  2003,2005,2008,2010   COLONOSCOPY WITH PROPOFOL N/A 06/03/2022   Procedure: COLONOSCOPY WITH PROPOFOL;  Surgeon: Wyline Mood, MD;  Location: [facility] ENDOSCOPY;  Service: Gastroenterology;  Laterality: N/A;   CYSTOSCOPY/URETEROSCOPY/HOLMIUM LASER/STENT PLACEMENT Left 12/02/2021   Procedure: CYSTOSCOPY/URETEROSCOPY/HOLMIUM LASER/STENT  PLACEMENT;  Surgeon: Riki Altes, MD;  Location: [facility] ORS;  Service: Urology;  Laterality: Left;   ESOPHAGOGASTRODUODENOSCOPY (EGD) WITH PROPOFOL N/A 06/03/2022   Procedure: ESOPHAGOGASTRODUODENOSCOPY (EGD) WITH PROPOFOL;  Surgeon: Wyline Mood, MD;  Location: [facility] ENDOSCOPY;  Service: Gastroenterology;  Laterality: N/A;   FOOT SURGERY     neuroma left foot   MEDIAL PARTIAL KNEE REPLACEMENT Left    meniscal repair Left 11/2018   tummy tuck  05/31/2014   UMBILICAL HERNIA REPAIR  05/31/2014   VENTRAL HERNIA REPAIR  05/31/2014   WISDOM TOOTH EXTRACTION     not under sedation    Home Medications:  Allergies as of 07/03/2022       Reactions   Bactrim [sulfamethoxazole-trimethoprim] Itching   Celebrex [celecoxib] Hives   Erythromycin Other (See Comments)   Severe abdominal pain   Penicillins Hives        Medication List        Accurate as of July 03, 2022 10:40 AM. If you have any questions, ask your nurse or doctor.          albuterol 108 (90 Base) MCG/ACT inhaler Commonly known as: VENTOLIN HFA Inhale 2 puffs into the lungs every 6 (six) hours as needed for wheezing or shortness of breath.   clobetasol ointment 0.05 % Commonly known as: TEMOVATE as needed.   omeprazole 40 MG capsule Commonly known as: PRILOSEC Take 1 capsule (40 mg  total) by mouth daily.        Allergies:  Allergies  Allergen Reactions   Bactrim [Sulfamethoxazole-Trimethoprim] Itching   Celebrex [Celecoxib] Hives   Erythromycin Other (See Comments)    Severe abdominal pain   Penicillins Hives    Family History: Family History  Problem Relation Age of Onset   Cancer Mother 21       breast cancer   Osteoarthritis Mother    Broken bones Mother        Hip x2   Varicose Veins Mother    Hyperlipidemia Father    Hypertension Father    Heart attack Father    Hypertension Paternal Grandmother    Congenital heart disease Paternal Grandmother    Stroke Paternal Grandfather    Kidney  disease Daughter    Hypercholesterolemia Brother    Colon cancer Maternal Grandmother    Heart disease Maternal Grandfather    Hypertension Sister    Migraines Sister    Thyroid disease Sister    GI problems Sister        Extra kink in her large intestine   Hypercholesterolemia Brother     Social History:  reports that she has never smoked. She has never used smokeless tobacco. She reports that she does not drink alcohol and does not use drugs.   Physical Exam: BP 123/85   Pulse 83   Ht 5\' 2"  (1.575 m)   Wt 178 lb (80.7 kg)   LMP 03/25/2020 (Approximate)   BMI 32.56 kg/m   Constitutional:  Alert and oriented, No acute distress. HEENT: [place] AT, moist mucus membranes.  Trachea midline, no masses. Cardiovascular: No clubbing, cyanosis, or edema. Respiratory: Normal respiratory effort, no increased work of breathing. GI: Abdomen is soft, nontender, nondistended, no abdominal masses Skin: No rashes, bruises or suspicious lesions. Neurologic: Grossly intact, no focal deficits, moving all 4 extremities. Psychiatric: Normal mood and affect.   Pertinent Imaging: KUB performed earlier this morning was personally reviewed and interpreted. There is a moderate amount of stool and bowel gas overlying the renal outlines. No calcifications suspicious for urinary tract stones are noted.   Assessment & Plan:    1. Recurrent nephrolithiasis Asymptomatic Punctate left adrenal calculus seen on previous CT is not visualized on today's KUB. Follow up 1 year with KUB.  [facility] [address], [address] [place], [place] [address] [phone]

## 2022-07-16 ENCOUNTER — Other Ambulatory Visit: Payer: Self-pay

## 2022-07-16 DIAGNOSIS — Z98891 History of uterine scar from previous surgery: Secondary | ICD-10-CM | POA: Insufficient documentation

## 2022-07-26 NOTE — Progress Notes (Deleted)
Gina Amy, PA-C [address]  [address]  [place], [place] [address]  Main: [phone]  Fax: [phone]   Primary Care Physician: Gina Cory, MD  Primary Gastroenterologist:  Gina Amy, PA-C / Dr. Wyline Mood    CC: Follow-up dysphagia, GERD, hepatic steatosis, bloating  HPI: [name] is a 52 y.o. female returns for 61-month follow-up of multiple GI issues including dysphagia, dyspepsia, GERD, hepatic steatosis, and bloating.  She was started on low FODMAP diet.  Was also given trial of Prilosec 40 Mg daily.  First screening colonoscopy 06/03/2022 showed excellent prep and no polyps.  10-year repeat.  EGD 06/03/2022 was normal.  No stricture.  Biopsies negative for eosinophilic esophagitis.  Labs 05/13/2022 showed normal LFTs and TSH.  Abdominal pelvic CT 11/2021 without contrast showed mild hepatic steatosis.  Current Outpatient Medications  Medication Sig Dispense Refill   albuterol (VENTOLIN HFA) 108 (90 Base) MCG/ACT inhaler Inhale 2 puffs into the lungs every 6 (six) hours as needed for wheezing or shortness of breath. 8 g 2   clobetasol ointment (TEMOVATE) 0.05 % as needed.  4   omeprazole (PRILOSEC) 40 MG capsule Take 1 capsule (40 mg total) by mouth daily. 90 capsule 3   No current facility-administered medications for this visit.    Allergies as of 07/27/2022 - Review Complete 07/03/2022  Allergen Reaction Noted   Bactrim [sulfamethoxazole-trimethoprim] Itching 03/23/2021   Celebrex [celecoxib] Hives 11/23/2021   Erythromycin Other (See Comments) 07/01/2011   Penicillins Hives 07/01/2011    Past Medical History:  Diagnosis Date   Allergy    Anemia    Childhood   Arthritis    Back pain    bulging disc   Clinical depression    Eczema    Exercise-induced asthma    Fatigue    Headache    History of 2019 novel coronavirus disease (COVID-19)    a.) 02/03/2019; b.) 10/01/2020   History of chicken pox    History of hiatal hernia     History of kidney stones    History of RSV infection 11/23/2021   a.) Tx'd with 6 day prednisone taper + promethazine/DM cough syrup   Insomnia    Irregular menses    Ovarian cyst    Over weight    Rectus diastasis    Right hip pain    Umbilical hernia     Past Surgical History:  Procedure Laterality Date   CESAREAN SECTION  2003,2005,2008,2010   COLONOSCOPY WITH PROPOFOL N/A 06/03/2022   Procedure: COLONOSCOPY WITH PROPOFOL;  Surgeon: Wyline Mood, MD;  Location: [facility] ENDOSCOPY;  Service: Gastroenterology;  Laterality: N/A;   CYSTOSCOPY/URETEROSCOPY/HOLMIUM LASER/STENT PLACEMENT Left 12/02/2021   Procedure: CYSTOSCOPY/URETEROSCOPY/HOLMIUM LASER/STENT PLACEMENT;  Surgeon: Riki Altes, MD;  Location: [facility] ORS;  Service: Urology;  Laterality: Left;   ESOPHAGOGASTRODUODENOSCOPY (EGD) WITH PROPOFOL N/A 06/03/2022   Procedure: ESOPHAGOGASTRODUODENOSCOPY (EGD) WITH PROPOFOL;  Surgeon: Wyline Mood, MD;  Location: [facility] ENDOSCOPY;  Service: Gastroenterology;  Laterality: N/A;   FOOT SURGERY     neuroma left foot   MEDIAL PARTIAL KNEE REPLACEMENT Left    meniscal repair Left 11/2018   tummy tuck  05/31/2014   UMBILICAL HERNIA REPAIR  05/31/2014   VENTRAL HERNIA REPAIR  05/31/2014   WISDOM TOOTH EXTRACTION     not under sedation    Review of Systems:    All systems reviewed and negative except where noted in HPI.   Physical Examination:   LMP 03/25/2020 (Approximate)   General: Well-nourished,  well-developed in no acute distress.  Eyes: No icterus. Conjunctivae pink. Mouth: Oropharyngeal mucosa moist and pink , no lesions erythema or exudate. Lungs: Clear to auscultation bilaterally. Non-labored. Heart: Regular rate and rhythm, no murmurs rubs or gallops.  Abdomen: Bowel sounds are normal; Abdomen is Soft; No hepatosplenomegaly, masses or hernias;  No Abdominal Tenderness; No guarding or rebound tenderness. Extremities: No lower extremity edema. No clubbing or deformities. Neuro:  Alert and oriented x 3.  Grossly intact. Skin: Warm and dry, no jaundice.   Psych: Alert and cooperative, normal mood and affect.   Imaging Studies: Abdomen 1 view (KUB)  Result Date: 07/09/2022 CLINICAL DATA:  Recurrent nephrolithiasis, follow-up EXAM: ABDOMEN - 1 VIEW COMPARISON:  11/23/2021 unenhanced CT abdomen/pelvis FINDINGS: No dilated small bowel loops. Mild-to-moderate diffuse colonic stool. No evidence of pneumatosis or pneumoperitoneum. Clear lung bases. No radiopaque stones overlie the kidneys or expected locations of the ureters or bladder. Stable tiny calcified venous phleboliths in the left deep pelvis. IMPRESSION: No radiopaque urolithiasis. Nonobstructive bowel gas pattern. Mild-to-moderate diffuse colonic stool. Electronically Signed   By: Delbert Phenix M.D.   On: 07/09/2022 13:44    Assessment and Plan:   [name] is a 52 y.o. y/o female   1.  Dysphagia  EGD 05/2022 was normal.  Biopsies negative for eosinophilic esophagitis.  No stricture.   2.  GERD  Prilosec 40 Mg daily?  3.  Mild hepatic steatosis  Low-fat diet.  Reassurance regarding normal LFTs.  4.  Bloating  Trial of Xifaxan for SIBO?  Low FODMAP diet  Align probiotic  5.  Colon cancer screening  Colonoscopy 05/2022 was normal.  Repeat in 10 years.    Gina Amy, PA-C  Follow up in ***  BP check ***

## 2022-07-27 ENCOUNTER — Ambulatory Visit: Payer: BC Managed Care – PPO | Admitting: Physician Assistant

## 2022-07-27 ENCOUNTER — Encounter: Payer: Self-pay | Admitting: Physician Assistant

## 2022-08-03 ENCOUNTER — Telehealth: Payer: Self-pay

## 2022-08-03 NOTE — Telephone Encounter (Signed)
Patient notified.    EGD 06/03/2022 by Dr. Tobi Bastos was normal.  Normal stomach, esophagus, and duodenum.  Biopsies were negative for eosinophilic esophagitis.  Colonoscopy 06/03/2022 by Dr. Tobi Bastos was also normal.  No polyps.  Excellent prep.  Repeat screening colonoscopy in 10 years.

## 2022-08-03 NOTE — Telephone Encounter (Signed)
Pt call back to r/s from 09-28-2022 to 10-07-2022

## 2022-08-03 NOTE — Telephone Encounter (Signed)
Pt hd appt on 07-27-2022 with Inetta Fermo. Pt daughter got Covid and pt forgot to call us.  I made pt anther appt and put he on the waiting list. He appt is 09-28-2022 with Inetta Fermo. Pt wants to know of she can get results from procedures over the phone? She doesn't want to wait until Sept. Dr. Tobi Bastos did the procedures.

## 2022-08-18 ENCOUNTER — Other Ambulatory Visit: Payer: Self-pay

## 2022-08-20 ENCOUNTER — Telehealth: Payer: Self-pay | Admitting: Physician Assistant

## 2022-08-20 NOTE — Telephone Encounter (Signed)
Called the patient to try to confirm her appointment. Patient didn't answer so I left an vm.

## 2022-08-21 ENCOUNTER — Encounter: Payer: Self-pay | Admitting: Physician Assistant

## 2022-08-21 ENCOUNTER — Ambulatory Visit: Payer: BC Managed Care – PPO | Admitting: Physician Assistant

## 2022-08-21 VITALS — BP 122/85 | HR 82 | Temp 98.2°F | Ht 62.0 in | Wt 178.5 lb

## 2022-08-21 DIAGNOSIS — R140 Abdominal distension (gaseous): Secondary | ICD-10-CM

## 2022-08-21 DIAGNOSIS — R1310 Dysphagia, unspecified: Secondary | ICD-10-CM

## 2022-08-21 DIAGNOSIS — K5901 Slow transit constipation: Secondary | ICD-10-CM

## 2022-08-21 NOTE — Progress Notes (Signed)
Gina Amy, PA-C [address]  [address]  [place], [place] [address]  Main: [phone]  Fax: [phone] Thank you  Primary Care Physician: Gina Cory, MD  Primary Gastroenterologist:  Gina Amy, PA-C / Dr. Wyline Mood    CC:  F/U Dysphagia, constipation, gas, and bloating  HPI: [name] is a 52 y.o. female returns for 102-month follow-up of dysphagia, bloating, gas, and constipation.  EGD done by Dr. Tobi Bastos 06/03/2022 was normal.  Esophageal biopsies were negative for EOE.  Colonoscopy done 06/03/2022 showed excellent prep, no polyps, normal colonoscopy.  10-year repeat.  Labs 04/2022 showed normal hepatic panel, normal TSH.  Imaging: Abdominal CT 11/2021 showed mild hepatic steatosis.  Symptoms: She continues to have solid food dysphagia.  Feels like food is going down very slowly.  She has had intermittent dysphagia symptoms for many years.  First episode occurred 12 years ago.  She continues to have a lot of of generalized abdominal bloating and gas.  Has bowel movement every other day or every 2 days.  Feels mildly constipated.  She is not currently taking omeprazole.  She denies heartburn or acid reflux symptoms.  She has PhD in nutrition.  She eats very healthy.  She has tried low FODMAP diet with little benefit.  She has not taken any probiotics.  No current treatment for constipation.  Current Outpatient Medications  Medication Sig Dispense Refill   albuterol (VENTOLIN HFA) 108 (90 Base) MCG/ACT inhaler Inhale 2 puffs into the lungs every 6 (six) hours as needed for wheezing or shortness of breath. 8 g 2   clobetasol ointment (TEMOVATE) 0.05 % as needed.  4   omeprazole (PRILOSEC) 40 MG capsule Take 1 capsule (40 mg total) by mouth daily. (Patient not taking: Reported on 08/21/2022) 90 capsule 3   No current facility-administered medications for this visit.    Allergies as of 08/21/2022 - Review Complete 08/21/2022  Allergen Reaction Noted   Bactrim  [sulfamethoxazole-trimethoprim] Itching 03/23/2021   Celebrex [celecoxib] Hives 11/23/2021   Erythromycin Other (See Comments) 07/01/2011   Penicillins Hives 07/01/2011    Past Medical History:  Diagnosis Date   Allergy    Anemia    Childhood   Arthritis    Back pain    bulging disc   Clinical depression    Eczema    Exercise-induced asthma    Fatigue    Headache    History of 2019 novel coronavirus disease (COVID-19)    a.) 02/03/2019; b.) 10/01/2020   History of chicken pox    History of hiatal hernia    History of kidney stones    History of RSV infection 11/23/2021   a.) Tx'd with 6 day prednisone taper + promethazine/DM cough syrup   Insomnia    Irregular menses    Ovarian cyst    Over weight    Rectus diastasis    Right hip pain    Umbilical hernia     Past Surgical History:  Procedure Laterality Date   CESAREAN SECTION  2003,2005,2008,2010   COLONOSCOPY WITH PROPOFOL N/A 06/03/2022   Procedure: COLONOSCOPY WITH PROPOFOL;  Surgeon: Wyline Mood, MD;  Location: [facility] ENDOSCOPY;  Service: Gastroenterology;  Laterality: N/A;   CYSTOSCOPY/URETEROSCOPY/HOLMIUM LASER/STENT PLACEMENT Left 12/02/2021   Procedure: CYSTOSCOPY/URETEROSCOPY/HOLMIUM LASER/STENT PLACEMENT;  Surgeon: Riki Altes, MD;  Location: [facility] ORS;  Service: Urology;  Laterality: Left;   ESOPHAGOGASTRODUODENOSCOPY (EGD) WITH PROPOFOL N/A 06/03/2022   Procedure: ESOPHAGOGASTRODUODENOSCOPY (EGD) WITH PROPOFOL;  Surgeon: Wyline Mood, MD;  Location: [facility] ENDOSCOPY;  Service: Gastroenterology;  Laterality: N/A;   FOOT SURGERY     neuroma left foot   MEDIAL PARTIAL KNEE REPLACEMENT Left    meniscal repair Left 11/2018   tummy tuck  05/31/2014   UMBILICAL HERNIA REPAIR  05/31/2014   VENTRAL HERNIA REPAIR  05/31/2014   WISDOM TOOTH EXTRACTION     not under sedation    Review of Systems:    All systems reviewed and negative except where noted in HPI.   Physical Examination:   BP 122/85 (BP Location:  Right Arm, Patient Position: Sitting, Cuff Size: Normal)   Pulse 82   Temp 98.2 F (36.8 C) (Oral)   Ht 5\' 2"  (1.575 m)   Wt 178 lb 8 oz (81 kg)   LMP 03/25/2020 (Approximate)   BMI 32.65 kg/m   General: Well-nourished, well-developed in no acute distress.  Eyes: No icterus. Conjunctivae pink. Neuro: Alert and oriented x 3.  Grossly intact.  Psych: Alert and cooperative, normal mood and affect.  Imaging Studies: No results found.  Assessment and Plan:   [name] is a 52 y.o. y/o female returns for follow-up of:  1.  Dysphagia -Recent EGD was normal.  Biopsies negative for EOE.  Still having persistent solid food dysphagia.  Scheduling barium swallow test with tablet to check for esophageal dysmotility or silent acid reflux.  Not currently on PPI.  Has omeprazole at home, but not taking.  Schedule barium swallow with tablet  Discussed restarting omeprazole daily for 1 month after barium swallow test.  2.  Slow colon from constipation with abdominal bloating and gas; Recent colonoscopy was normal.  Gave samples of Trulance 3 mg 1 tablet once daily to try.  Trial of Align probiotic 1 capsule once daily.  Consider trial of MiraLAX, Linzess, or Amitiza in the future if needed.  Continue high-fiber diet and 64 ounces of water daily.  3.  Abdominal bloating and gas  Start Align probiotic 1 capsule once daily.  Continue low FODMAP diet.  Avoid gas producing foods.  Treat underlying constipation.  If symptoms persist, consider testing for SIBO (small intestinal bacterial overgrowth).  Also discussed trial of Xifaxan in the future if needed.  Patient declined Rx today.  Gina Amy, PA-C  Follow up in 3 months or as needed based on GI symptoms.

## 2022-08-21 NOTE — Patient Instructions (Addendum)
.   Your Barium Swallow test is scheduled for 08/26/2022 arrive at 9:15am for a 9:30am scan at [facility].  If you need to reschedule please call [phone] option 3 and then option 2.  Try Trulance 3mg  (samples) 1 tablet once daily for constipation. If Trulance samples worked well, then call me back for a prescription.  If Trulance does not work well, Then try Align probiotic 1 capsule once daily OTC.  Other treatments for constipation include: - OTC MiraLAX powder, mix 1 capful in a drink once daily. - Rx Linzess It is trial and error to see what will work best for your constipation /slow colon. - If gas and bloating persist, we can order a test to check for SIBO, small intestinal bacterial overgrowth.-

## 2022-08-26 ENCOUNTER — Ambulatory Visit
Admission: RE | Admit: 2022-08-26 | Discharge: 2022-08-26 | Disposition: A | Discharge status: Home / Self Care | Payer: BC Managed Care – PPO | Source: Ambulatory Visit | Attending: Physician Assistant | Admitting: Physician Assistant

## 2022-08-26 ENCOUNTER — Other Ambulatory Visit: Payer: Self-pay | Admitting: Physician Assistant

## 2022-08-26 DIAGNOSIS — R1310 Dysphagia, unspecified: Secondary | ICD-10-CM

## 2022-08-26 DIAGNOSIS — K5901 Slow transit constipation: Secondary | ICD-10-CM

## 2022-08-26 DIAGNOSIS — R140 Abdominal distension (gaseous): Secondary | ICD-10-CM

## 2022-09-01 ENCOUNTER — Encounter: Payer: Self-pay | Admitting: Podiatry

## 2022-09-01 ENCOUNTER — Ambulatory Visit: Payer: BC Managed Care – PPO | Admitting: Podiatry

## 2022-09-01 ENCOUNTER — Ambulatory Visit (INDEPENDENT_AMBULATORY_CARE_PROVIDER_SITE_OTHER): Payer: BC Managed Care – PPO

## 2022-09-01 DIAGNOSIS — M722 Plantar fascial fibromatosis: Secondary | ICD-10-CM

## 2022-09-01 DIAGNOSIS — M779 Enthesopathy, unspecified: Secondary | ICD-10-CM | POA: Diagnosis not present

## 2022-09-01 NOTE — Progress Notes (Signed)
Chief Complaint  Patient presents with   Foot Pain    "I have been having pain on the outer side of my left foot." N - pain side of foot L - lateral pain left D - 6 mos O - suddenly, about the same C - dull ache, feels inflammed A - get up initially from bed or from sitting a while T - ice, exercises to stretch it, wear shoes with good arches   Foot Orthotics    "I want to get new orthotics."    HPI: 52 y.o. female presenting today for evaluation of left foot pain.  Patient also requesting new orthotics.  She says that the orthotics help significantly to reduce her heel pain.  She is very satisfied with her orthotics.  Pain to the left foot has been ongoing for few months now.  Idiopathic onset.  She has not done anything for treatment.  Denies a history of injury.  Past Medical History:  Diagnosis Date   Allergy    Anemia    Childhood   Arthritis    Back pain    bulging disc   Clinical depression    Eczema    Exercise-induced asthma    Fatigue    Headache    History of 2019 novel coronavirus disease (COVID-19)    a.) 02/03/2019; b.) 10/01/2020   History of chicken pox    History of hiatal hernia    History of kidney stones    History of RSV infection 11/23/2021   a.) Tx'd with 6 day prednisone taper + promethazine/DM cough syrup   Insomnia    Irregular menses    Ovarian cyst    Over weight    Rectus diastasis    Right hip pain    Umbilical hernia     Past Surgical History:  Procedure Laterality Date   CESAREAN SECTION  2003,2005,2008,2010   COLONOSCOPY WITH PROPOFOL N/A 06/03/2022   Procedure: COLONOSCOPY WITH PROPOFOL;  Surgeon: Wyline Mood, MD;  Location: [facility] ENDOSCOPY;  Service: Gastroenterology;  Laterality: N/A;   CYSTOSCOPY/URETEROSCOPY/HOLMIUM LASER/STENT PLACEMENT Left 12/02/2021   Procedure: CYSTOSCOPY/URETEROSCOPY/HOLMIUM LASER/STENT PLACEMENT;  Surgeon: Riki Altes, MD;  Location: [facility] ORS;  Service: Urology;  Laterality: Left;    ESOPHAGOGASTRODUODENOSCOPY (EGD) WITH PROPOFOL N/A 06/03/2022   Procedure: ESOPHAGOGASTRODUODENOSCOPY (EGD) WITH PROPOFOL;  Surgeon: Wyline Mood, MD;  Location: [facility] ENDOSCOPY;  Service: Gastroenterology;  Laterality: N/A;   FOOT SURGERY     neuroma left foot   MEDIAL PARTIAL KNEE REPLACEMENT Left    meniscal repair Left 11/2018   tummy tuck  05/31/2014   UMBILICAL HERNIA REPAIR  05/31/2014   VENTRAL HERNIA REPAIR  05/31/2014   WISDOM TOOTH EXTRACTION     not under sedation    Allergies  Allergen Reactions   Bactrim [Sulfamethoxazole-Trimethoprim] Itching   Celebrex [Celecoxib] Hives   Erythromycin Other (See Comments)    Severe abdominal pain   Penicillins Hives     Physical Exam: General: The patient is alert and oriented x3 in no acute distress.  Dermatology: Skin is warm, dry and supple bilateral lower extremities.   Vascular: Palpable pedal pulses bilaterally. Capillary refill within normal limits.  No appreciable edema.  No erythema.  Neurological: Grossly intact via light touch  Musculoskeletal Exam: No pedal deformities noted.  There continues to be some chronic low-grade tenderness to the bilateral heels.  Also pain on palpation to the fourth and fifth metatarsals of the left foot where the sandals traverse the foot  Radiographic  Exam B/L feet 09/01/2022:  Normal osseous mineralization. Joint spaces preserved.  No fractures or osseous irregularities noted.  Impression: Negative  Assessment/Plan of Care: 1.  Plantar fasciitis bilateral; chronic 2.  Pain left lateral forefoot secondary to sandals  -Advised against wearing thong-type sandals that run across the fourth and fifth metatarsals -Continue her custom orthotics.   -New appointment for new custom molded orthotics/insoles to support the medial longitudinal arch of the foot and offload pressure from the bilateral heels.  Order placed -Return to clinic as needed        Felecia Shelling, DPM [facility]  Dr. Felecia Shelling, DPM    [address] [place], [place] [address]                Office [phone]  Fax [phone]

## 2022-09-02 ENCOUNTER — Ambulatory Visit: Payer: BC Managed Care – PPO | Admitting: Physician Assistant

## 2022-09-11 ENCOUNTER — Other Ambulatory Visit: Payer: BC Managed Care – PPO

## 2022-09-23 DIAGNOSIS — S82832A Other fracture of upper and lower end of left fibula, initial encounter for closed fracture: Secondary | ICD-10-CM | POA: Diagnosis not present

## 2022-09-23 DIAGNOSIS — M25572 Pain in left ankle and joints of left foot: Secondary | ICD-10-CM | POA: Diagnosis not present

## 2022-09-25 ENCOUNTER — Other Ambulatory Visit: Payer: BC Managed Care – PPO

## 2022-09-28 ENCOUNTER — Ambulatory Visit: Payer: BC Managed Care – PPO | Admitting: Physician Assistant

## 2022-09-30 DIAGNOSIS — S82831D Other fracture of upper and lower end of right fibula, subsequent encounter for closed fracture with routine healing: Secondary | ICD-10-CM | POA: Diagnosis not present

## 2022-10-07 ENCOUNTER — Ambulatory Visit: Payer: BC Managed Care – PPO | Admitting: Physician Assistant

## 2022-10-27 DIAGNOSIS — M25562 Pain in left knee: Secondary | ICD-10-CM | POA: Diagnosis not present

## 2022-10-27 DIAGNOSIS — S82832D Other fracture of upper and lower end of left fibula, subsequent encounter for closed fracture with routine healing: Secondary | ICD-10-CM | POA: Diagnosis not present

## 2022-11-12 DIAGNOSIS — M25562 Pain in left knee: Secondary | ICD-10-CM | POA: Diagnosis not present

## 2022-11-12 DIAGNOSIS — Z96652 Presence of left artificial knee joint: Secondary | ICD-10-CM | POA: Diagnosis not present

## 2022-11-12 DIAGNOSIS — G8929 Other chronic pain: Secondary | ICD-10-CM | POA: Diagnosis not present

## 2022-11-18 ENCOUNTER — Telehealth: Payer: Self-pay | Admitting: Physician Assistant

## 2022-11-18 NOTE — Telephone Encounter (Signed)
Patient called in to reschedule her office visit. She will be going out of town that week. Call dropped had to call back to finish rescheduling her appointment.

## 2022-11-24 ENCOUNTER — Ambulatory Visit: Payer: BC Managed Care – PPO | Admitting: Physician Assistant

## 2022-12-02 DIAGNOSIS — L820 Inflamed seborrheic keratosis: Secondary | ICD-10-CM | POA: Diagnosis not present

## 2022-12-02 DIAGNOSIS — D2261 Melanocytic nevi of right upper limb, including shoulder: Secondary | ICD-10-CM | POA: Diagnosis not present

## 2022-12-02 DIAGNOSIS — L308 Other specified dermatitis: Secondary | ICD-10-CM | POA: Diagnosis not present

## 2022-12-02 DIAGNOSIS — D2262 Melanocytic nevi of left upper limb, including shoulder: Secondary | ICD-10-CM | POA: Diagnosis not present

## 2022-12-02 DIAGNOSIS — D225 Melanocytic nevi of trunk: Secondary | ICD-10-CM | POA: Diagnosis not present

## 2022-12-15 NOTE — Progress Notes (Signed)
Name: [name]   MRN: [id number]    DOB: September 09, 1970   Date:12/22/2022       Progress Note  Subjective  Chief Complaint  Chief Complaint  Patient presents with   Annual Exam    HPI  Patient presents for annual CPE.  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  Exercise: very active, frustrated about gaining weight since menopause   Last Eye Exam: up to date Last Dental Exam: up to date   Flowsheet Row Office Visit from 12/22/2022 in [facility]  AUDIT-C Score 1      Depression: Phq 9 is  negative    12/22/2022    1:56 PM 12/16/2021    2:06 PM 12/16/2021    2:05 PM 09/02/2020    8:16 AM 08/31/2019    8:36 AM  Depression screen PHQ 2/9  Decreased Interest 0 0 0 0 0  Down, Depressed, Hopeless 0 0 0 0 0  PHQ - 2 Score 0 0 0 0 0  Altered sleeping 0 0  0 0  Tired, decreased energy 0 0  0 0  Change in appetite 0 0  0 0  Feeling bad or failure about yourself  0 0  0 0  Trouble concentrating 0 0  0 0  Moving slowly or fidgety/restless 0 0  0 0  Suicidal thoughts 0 0  0 0  PHQ-9 Score 0 0  0 0  Difficult doing work/chores Not difficult at all       Hypertension: BP Readings from Last 3 Encounters:  12/22/22 122/80  12/22/22 118/70  08/21/22 122/85   Obesity: Wt Readings from Last 3 Encounters:  12/22/22 186 lb 3.2 oz (84.5 kg)  12/22/22 186 lb 4 oz (84.5 kg)  08/21/22 178 lb 8 oz (81 kg)   BMI Readings from Last 3 Encounters:  12/22/22 34.06 kg/m  12/22/22 34.07 kg/m  08/21/22 32.65 kg/m     Vaccines:   HPV: aged out Tdap:12/16/21  Shingrix: due Pneumonia: n/a Flu: due COVID-19:5 doses   Hep C Screening: complete STD testing and prevention (HIV/chl/gon/syphilis): not interested  Intimate partner violence: negative screen  Sexual History : she has vaginal dryness, tried lubrication but not helping  - she has discussed with ob Menstrual History/LMP/Abnormal Bleeding: post menopausal since age 51 Discussed importance of  follow up if any post-menopausal bleeding: yes  Incontinence Symptoms: negative for symptoms   Breast cancer:  - Last Mammogram: gets from gyn  - BRCA gene screening: N/A  Osteoporosis Prevention : Discussed high calcium and vitamin D supplementation, weight bearing exercises Bone density :not applicable   Cervical cancer screening: 01/05/22  Skin cancer: up to date  Colonoscopy: up to date 05/2022  Lung cancer:  Low Dose CT Chest recommended if Age 78-80 years, 20 pack-year currently smoking OR have quit w/in 15years. Patient does not qualify for screen   ECG:03/08/18  Advanced Care Planning: A voluntary discussion about advance care planning including the explanation and discussion of advance directives.  Discussed health care proxy and Living will, and the patient was able to identify a health care proxy as husband .  Patient does have a living will and power of attorney of health care   Lipids: Lab Results  Component Value Date   CHOL 178 09/02/2020   CHOL 182 08/31/2019   CHOL 200 (H) 08/29/2018   Lab Results  Component Value Date   HDL 57 09/02/2020   HDL 59 08/31/2019  HDL 58 08/29/2018   Lab Results  Component Value Date   LDLCALC 105 (H) 09/02/2020   LDLCALC 107 (H) 08/31/2019   LDLCALC 122 (H) 08/29/2018   Lab Results  Component Value Date   TRIG 74 09/02/2020   TRIG 70 08/31/2019   TRIG 97 08/29/2018   Lab Results  Component Value Date   CHOLHDL 3.1 09/02/2020   CHOLHDL 3.1 08/31/2019   CHOLHDL 3.4 08/29/2018   No results found for: "LDLDIRECT"  Glucose: Glucose, Bld  Date Value Ref Range Status  11/23/2021 120 (H) 70 - 99 mg/dL Final    Comment:    Glucose reference range applies only to samples taken after fasting for at least 8 hours.  09/02/2020 97 65 - 99 mg/dL Final    Comment:    .            Fasting reference interval .   08/31/2019 84 65 - 99 mg/dL Final    Comment:    .            Fasting reference interval .     Patient  Active Problem List   Diagnosis Date Noted   History of cesarean section 07/16/2022   Colon cancer screening 06/03/2022   Other dysphagia 06/03/2022   Dyspareunia in female 01/05/2022   Calculus of kidney 01/05/2022   Hiatal hernia 12/16/2021   Hepatic steatosis 12/16/2021   Status post left partial knee replacement 12/07/2019   Menopausal symptom 06/17/2017   Irregular periods 06/17/2017   Dysmenorrhea 06/17/2017   Vitamin D deficiency 12/04/2016   Plantar fasciitis, left 05/19/2016   Cervical high risk HPV (human papillomavirus) test positive 11/04/2015   Allergic rhinitis 10/10/2014   Back pain, chronic 10/10/2014   Dermatitis, eczematoid 10/10/2014   Insomnia 10/10/2014   H/O allergy to multiple drugs 02/22/2012   Blood type, Rh negative 02/22/2012   Previous cesarean delivery, delivered 02/22/2012   Personal history of kidney stones 02/22/2012    Past Surgical History:  Procedure Laterality Date   CESAREAN SECTION  2003,2005,2008,2010   COLONOSCOPY WITH PROPOFOL N/A 06/03/2022   Procedure: COLONOSCOPY WITH PROPOFOL;  Surgeon: Wyline Mood, MD;  Location: [facility] ENDOSCOPY;  Service: Gastroenterology;  Laterality: N/A;   CYSTOSCOPY/URETEROSCOPY/HOLMIUM LASER/STENT PLACEMENT Left 12/02/2021   Procedure: CYSTOSCOPY/URETEROSCOPY/HOLMIUM LASER/STENT PLACEMENT;  Surgeon: Riki Altes, MD;  Location: [facility] ORS;  Service: Urology;  Laterality: Left;   ESOPHAGOGASTRODUODENOSCOPY (EGD) WITH PROPOFOL N/A 06/03/2022   Procedure: ESOPHAGOGASTRODUODENOSCOPY (EGD) WITH PROPOFOL;  Surgeon: Wyline Mood, MD;  Location: [facility] ENDOSCOPY;  Service: Gastroenterology;  Laterality: N/A;   FOOT SURGERY     neuroma left foot   HERNIA REPAIR     abdominoplasty   MEDIAL PARTIAL KNEE REPLACEMENT Left    meniscal repair Left 11/2018   tummy tuck  05/31/2014   UMBILICAL HERNIA REPAIR  05/31/2014   VENTRAL HERNIA REPAIR  05/31/2014   WISDOM TOOTH EXTRACTION     not under sedation    Family History   Problem Relation Age of Onset   Cancer Mother 75       breast cancer   Osteoarthritis Mother    Broken bones Mother        Hip x2   Varicose Veins Mother    Hyperlipidemia Father    Hypertension Father    Heart attack Father    Hypertension Paternal Grandmother    Congenital heart disease Paternal Grandmother    Stroke Paternal Grandfather    Kidney disease Daughter    Hypercholesterolemia  Brother    Colon cancer Maternal Grandmother    Heart disease Maternal Grandfather    Hypertension Sister    Migraines Sister    Thyroid disease Sister    GI problems Sister        Extra kink in her large intestine   Hypercholesterolemia Brother     Social History   Socioeconomic History   Marital status: Married    Spouse name: Alinda Money   Number of children: 4   Years of education: Not on file   Highest education level: Doctorate  Occupational History   Occupation: Professor    Employer: [employer]    Comment: [place]  Tobacco Use   Smoking status: Never   Smokeless tobacco: Never  Vaping Use   Vaping status: Never Used  Substance and Sexual Activity   Alcohol use: No   Drug use: No   Sexual activity: Yes    Partners: Male    Birth control/protection: Rhythm    Comment: married   Other Topics Concern   Not on file  Social History Narrative   Married, she has 4 daughters    She works as Network engineer at [employer], and is on Research scientist (medical) role and teaching   Social Determinants of Corporate investment banker Strain: Low Risk  (12/22/2022)   Overall Financial Resource Strain (CARDIA)    Difficulty of Paying Living Expenses: Not hard at all  Food Insecurity: No Food Insecurity (12/22/2022)   Hunger Vital Sign    Worried About Running Out of Food in the Last Year: Never true    Ran Out of Food in the Last Year: Never true  Transportation Needs: No Transportation Needs (12/22/2022)   PRAPARE - Administrator, Civil Service (Medical): No    Lack of Transportation  (Non-Medical): No  Physical Activity: Sufficiently Active (12/22/2022)   Exercise Vital Sign    Days of Exercise per Week: 5 days    Minutes of Exercise per Session: 40 min  Stress: No Stress Concern Present (12/22/2022)   [employer] of Occupational Health - Occupational Stress Questionnaire    Feeling of Stress : Not at all  Social Connections: Moderately Integrated (12/22/2022)   Social Connection and Isolation Panel [NHANES]    Frequency of Communication with Friends and Family: More than three times a week    Frequency of Social Gatherings with Friends and Family: More than three times a week    Attends Religious Services: More than 4 times per year    Active Member of [employer] or Organizations: No    Attends Banker Meetings: Never    Marital Status: Married  Catering manager Violence: Not At Risk (12/22/2022)   Humiliation, Afraid, Rape, and Kick questionnaire    Fear of Current or Ex-Partner: No    Emotionally Abused: No    Physically Abused: No    Sexually Abused: No     Current Outpatient Medications:    albuterol (VENTOLIN HFA) 108 (90 Base) MCG/ACT inhaler, Inhale 2 puffs into the lungs every 6 (six) hours as needed for wheezing or shortness of breath., Disp: 8 g, Rfl: 2   clobetasol ointment (TEMOVATE) 0.05 %, as needed., Disp: , Rfl: 4   omeprazole (PRILOSEC) 40 MG capsule, Take 1 capsule (40 mg total) by mouth daily., Disp: 90 capsule, Rfl: 3   Plecanatide (TRULANCE) 3 MG TABS, Take 1 tablet (3 mg total) by mouth daily., Disp: 90 tablet, Rfl: 3  Allergies  Allergen Reactions  Bactrim [Sulfamethoxazole-Trimethoprim] Itching   Celebrex [Celecoxib] Hives   Erythromycin Other (See Comments)    Severe abdominal pain   Penicillins Hives     ROS  Constitutional: Negative for fever, positive for weight change.  Respiratory: Negative for cough and shortness of breath.   Cardiovascular: Negative for chest pain or palpitations.  Gastrointestinal:  Negative for abdominal pain, positive for intermittent  bowel changes, seen by GI Musculoskeletal: Negative for gait problem  Skin: Negative for rash.  Neurological: Negative for dizziness or headache.  No other specific complaints in a complete review of systems (except as listed in HPI above).   Objective  Vitals:   12/22/22 1406  BP: 122/80  Pulse: 79  Resp: 16  Temp: (!) 97.4 F (36.3 C)  TempSrc: Oral  SpO2: 98%  Weight: 186 lb 3.2 oz (84.5 kg)  Height: 5\' 2"  (1.575 m)    Body mass index is 34.06 kg/m.  Physical Exam  Constitutional: Patient appears well-developed and well-nourished. No distress.  HENT: Head: Normocephalic and atraumatic. Ears: B TMs ok, no erythema or effusion; Nose: Nose normal. Mouth/Throat: Oropharynx is clear and moist. No oropharyngeal exudate.  Eyes: Conjunctivae and EOM are normal. Pupils are equal, round, and reactive to light. No scleral icterus.  Neck: Normal range of motion. Neck supple. No JVD present. No thyromegaly present.  Cardiovascular: Normal rate, regular rhythm and normal heart sounds.  No murmur heard. No BLE edema. Pulmonary/Chest: Effort normal and breath sounds normal. No respiratory distress. Abdominal: Soft. Bowel sounds are normal, no distension. There is no tenderness. no masses Breast: no lumps or masses, no nipple discharge or rashes FEMALE GENITALIA: not done, sees gyn  RECTAL: not done  Musculoskeletal: Normal range of motion, no joint effusions. No gross deformities Neurological: he is alert and oriented to person, place, and time. No cranial nerve deficit. Coordination, balance, strength, speech and gait are normal.  Skin: Skin is warm and dry. No rash noted. No erythema.  Psychiatric: Patient has a normal mood and affect. behavior is normal. Judgment and thought content normal.    Fall Risk:    12/22/2022    1:55 PM 09/02/2020    8:16 AM 08/31/2019    8:36 AM 08/29/2018    9:00 AM 06/23/2018    9:31 AM  Fall Risk    Falls in the past year? 1 0 0 0 0  Number falls in past yr: 0 0 0 0 0  Injury with Fall? 1 0 0 0 0  Risk for fall due to : Impaired balance/gait      Follow up Falls prevention discussed;Education provided;Falls evaluation completed         Functional Status Survey: Is the patient deaf or have difficulty hearing?: No Does the patient have difficulty seeing, even when wearing glasses/contacts?: No Does the patient have difficulty concentrating, remembering, or making decisions?: No Does the patient have difficulty walking or climbing stairs?: No Does the patient have difficulty dressing or bathing?: No Does the patient have difficulty doing errands alone such as visiting a doctor's office or shopping?: No   Assessment & Plan  1. Well adult exam  - Thyroid Panel With TSH - Hemoglobin A1c - CBC with Differential/Platelet - COMPLETE METABOLIC PANEL WITH GFR - Lipid panel  2. Lipid screening  - Lipid panel  3. Diabetes mellitus screening  - Hemoglobin A1c  4. Hepatic steatosis  - COMPLETE METABOLIC PANEL WITH GFR  5. Weight gain  - Thyroid Panel With TSH  6. Need for shingles vaccine  - Zoster, Recombinant (Shingrix)   -USPSTF grade A and B recommendations reviewed with patient; age-appropriate recommendations, preventive care, screening tests, etc discussed and encouraged; healthy living encouraged; see AVS for patient education given to patient -Discussed importance of 150 minutes of physical activity weekly, eat two servings of fish weekly, eat one serving of tree nuts ( cashews, pistachios, pecans, almonds.Marland Kitchen) every other day, eat 6 servings of fruit/vegetables daily and drink plenty of water and avoid sweet beverages.   -Reviewed Health Maintenance: Yes.

## 2022-12-21 NOTE — Progress Notes (Signed)
Celso Amy, PA-C [address]  [address]  [place], [place] [address]  Main: [phone]  Fax: [phone]   Primary Care Physician: Alba Cory, MD  Primary Gastroenterologist:  Celso Amy, PA-C / Dr. Wyline Mood    CC: Follow-up dysphagia, constipation, bloating, gas.  HPI: [name] is a 52 y.o. female returns for 9-month follow-up of GERD, constipation, bloating, and gas.  She tried low FODMAP diet with little benefit.  Was given samples of Trulance 3 mg daily 4 months ago which helped, however she was reluctant to take daily medication.  She tried align probiotic with no benefit.  Takes omeprazole 40 Mg daily with good control of acid reflux.  Currently she is not taking any treatment for constipation.  She continues to have abdominal bloating, gas, and has bowel movement every 3 days with more small stools.  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  Constipation and bloating have persisted in spite of dietary modification.  She admits to weight gain.  Has appointment with her PCP for physical and lab work this afternoon.  Normal TSH 04/2022.  08/2022 barium swallow with tablet: Small sliding hiatal hernia, otherwise normal.  EGD done by Dr. Tobi Bastos 06/03/2022 was normal.  Esophageal biopsies were negative for EOE.   Colonoscopy done 06/03/2022 showed excellent prep, no polyps, normal colonoscopy.  10-year repeat.   Labs 04/2022 showed normal hepatic panel, normal TSH.   Abdominal CT 11/2021 showed mild hepatic steatosis.  Current Outpatient Medications  Medication Sig Dispense Refill   albuterol (VENTOLIN HFA) 108 (90 Base) MCG/ACT inhaler Inhale 2 puffs into the lungs every 6 (six) hours as needed for wheezing or shortness of breath. 8 g 2   clobetasol ointment (TEMOVATE) 0.05 % as needed.  4   Plecanatide (TRULANCE) 3 MG TABS Take 1 tablet (3 mg total) by mouth daily. 90 tablet 3   omeprazole (PRILOSEC) 40 MG capsule Take 1 capsule (40 mg total) by  mouth daily. (Patient not taking: Reported on 08/21/2022) 90 capsule 3   No current facility-administered medications for this visit.    Allergies as of 12/22/2022 - Review Complete 12/22/2022  Allergen Reaction Noted   Bactrim [sulfamethoxazole-trimethoprim] Itching 03/23/2021   Celebrex [celecoxib] Hives 11/23/2021   Erythromycin Other (See Comments) 07/01/2011   Penicillins Hives 07/01/2011    Past Medical History:  Diagnosis Date   Allergy    Anemia    Childhood   Arthritis    Back pain    bulging disc   Clinical depression    Eczema    Exercise-induced asthma    Fatigue    Headache    History of 2019 novel coronavirus disease (COVID-19)    a.) 02/03/2019; b.) 10/01/2020   History of chicken pox    History of hiatal hernia    History of kidney stones    History of RSV infection 11/23/2021   a.) Tx'd with 6 day prednisone taper + promethazine/DM cough syrup   Insomnia    Irregular menses    Ovarian cyst    Over weight    Rectus diastasis    Right hip pain    Umbilical hernia     Past Surgical History:  Procedure Laterality Date   CESAREAN SECTION  2003,2005,2008,2010   COLONOSCOPY WITH PROPOFOL N/A 06/03/2022   Procedure: COLONOSCOPY WITH PROPOFOL;  Surgeon: Wyline Mood, MD;  Location: [facility] ENDOSCOPY;  Service: Gastroenterology;  Laterality: N/A;   CYSTOSCOPY/URETEROSCOPY/HOLMIUM LASER/STENT PLACEMENT Left 12/02/2021  Procedure: CYSTOSCOPY/URETEROSCOPY/HOLMIUM LASER/STENT PLACEMENT;  Surgeon: Riki Altes, MD;  Location: [facility] ORS;  Service: Urology;  Laterality: Left;   ESOPHAGOGASTRODUODENOSCOPY (EGD) WITH PROPOFOL N/A 06/03/2022   Procedure: ESOPHAGOGASTRODUODENOSCOPY (EGD) WITH PROPOFOL;  Surgeon: Wyline Mood, MD;  Location: [facility] ENDOSCOPY;  Service: Gastroenterology;  Laterality: N/A;   FOOT SURGERY     neuroma left foot   MEDIAL PARTIAL KNEE REPLACEMENT Left    meniscal repair Left 11/2018   tummy tuck  05/31/2014   UMBILICAL HERNIA REPAIR  05/31/2014    VENTRAL HERNIA REPAIR  05/31/2014   WISDOM TOOTH EXTRACTION     not under sedation    Review of Systems:    All systems reviewed and negative except where noted in HPI.   Physical Examination:   BP 118/70 (BP Location: Left Arm, Patient Position: Sitting, Cuff Size: Normal)   Pulse 79   Temp 98.2 F (36.8 C) (Oral)   Ht 5\' 2"  (1.575 m)   Wt 186 lb 4 oz (84.5 kg)   LMP 03/25/2020 (Approximate)   BMI 34.07 kg/m   General: Well-nourished, well-developed in no acute distress.  Neuro: Alert and oriented x 3.  Grossly intact.  Psych: Alert and cooperative, normal mood and affect.   Imaging Studies: No results found.  Assessment and Plan:   [name] is a 52 y.o. y/o female returns for followup of:  Chronic idiopathic constipation Irritable bowel syndrome with constipation Gas and bloating  Above GI symptoms improved on Trulance.  Plan: -Re-Start Trulance 3mg  1 tablet once daily, #90, 3 RF.  Discount card given. -If Trulance is cost prohibitive, then I will start her on Amitiza 24 mcg 1 capsule twice daily with food. -Continue healthy high-fiber diet, exercise, and 64 ounces of water daily. -If gas and bloating persist, then order hydrogen breath test to check for SIBO.  Patient declined SIBO test today.  Celso Amy, PA-C  Follow up in 1 year to refill medication or sooner if GI symptoms worsen.

## 2022-12-22 ENCOUNTER — Ambulatory Visit (INDEPENDENT_AMBULATORY_CARE_PROVIDER_SITE_OTHER): Payer: BC Managed Care – PPO | Admitting: Family Medicine

## 2022-12-22 ENCOUNTER — Telehealth: Payer: Self-pay

## 2022-12-22 ENCOUNTER — Encounter: Payer: Self-pay | Admitting: Family Medicine

## 2022-12-22 ENCOUNTER — Encounter: Payer: Self-pay | Admitting: Physician Assistant

## 2022-12-22 ENCOUNTER — Ambulatory Visit: Payer: BC Managed Care – PPO | Admitting: Physician Assistant

## 2022-12-22 VITALS — BP 122/80 | HR 79 | Temp 97.4°F | Resp 16 | Ht 62.0 in | Wt 186.2 lb

## 2022-12-22 VITALS — BP 118/70 | HR 79 | Temp 98.2°F | Ht 62.0 in | Wt 186.2 lb

## 2022-12-22 DIAGNOSIS — Z1211 Encounter for screening for malignant neoplasm of colon: Secondary | ICD-10-CM

## 2022-12-22 DIAGNOSIS — Z131 Encounter for screening for diabetes mellitus: Secondary | ICD-10-CM | POA: Diagnosis not present

## 2022-12-22 DIAGNOSIS — R635 Abnormal weight gain: Secondary | ICD-10-CM | POA: Diagnosis not present

## 2022-12-22 DIAGNOSIS — K581 Irritable bowel syndrome with constipation: Secondary | ICD-10-CM

## 2022-12-22 DIAGNOSIS — Z0001 Encounter for general adult medical examination with abnormal findings: Secondary | ICD-10-CM

## 2022-12-22 DIAGNOSIS — R140 Abdominal distension (gaseous): Secondary | ICD-10-CM | POA: Diagnosis not present

## 2022-12-22 DIAGNOSIS — Z1231 Encounter for screening mammogram for malignant neoplasm of breast: Secondary | ICD-10-CM

## 2022-12-22 DIAGNOSIS — Z23 Encounter for immunization: Secondary | ICD-10-CM

## 2022-12-22 DIAGNOSIS — R143 Flatulence: Secondary | ICD-10-CM

## 2022-12-22 DIAGNOSIS — Z0000 Encounter for general adult medical examination without abnormal findings: Secondary | ICD-10-CM

## 2022-12-22 DIAGNOSIS — Z13220 Encounter for screening for lipoid disorders: Secondary | ICD-10-CM | POA: Diagnosis not present

## 2022-12-22 DIAGNOSIS — K760 Fatty (change of) liver, not elsewhere classified: Secondary | ICD-10-CM

## 2022-12-22 DIAGNOSIS — K5901 Slow transit constipation: Secondary | ICD-10-CM

## 2022-12-22 MED ORDER — TRULANCE 3 MG PO TABS
1.0000 | ORAL_TABLET | Freq: Every day | ORAL | 3 refills | Status: AC
Start: 2022-12-22 — End: 2023-12-17

## 2022-12-22 NOTE — Telephone Encounter (Signed)
Submitted PA through cover my meds for Trulance '3mg'$ . Waiting on response from insurance company

## 2022-12-22 NOTE — Telephone Encounter (Signed)
Approved the medication from 12/22/2022 to 12/22/2023

## 2022-12-23 LAB — CBC WITH DIFFERENTIAL/PLATELET
Absolute Lymphocytes: 1966 {cells}/uL (ref 850–3900)
Absolute Monocytes: 446 {cells}/uL (ref 200–950)
Basophils Absolute: 43 {cells}/uL (ref 0–200)
Basophils Relative: 0.6 %
Eosinophils Absolute: 252 {cells}/uL (ref 15–500)
Eosinophils Relative: 3.5 %
HCT: 42.5 % (ref 35.0–45.0)
Hemoglobin: 14.6 g/dL (ref 11.7–15.5)
MCH: 30 pg (ref 27.0–33.0)
MCHC: 34.4 g/dL (ref 32.0–36.0)
MCV: 87.4 fL (ref 80.0–100.0)
MPV: 10.1 fL (ref 7.5–12.5)
Monocytes Relative: 6.2 %
Neutro Abs: 4493 {cells}/uL (ref 1500–7800)
Neutrophils Relative %: 62.4 %
Platelets: 276 10*3/uL (ref 140–400)
RBC: 4.86 10*6/uL (ref 3.80–5.10)
RDW: 12.4 % (ref 11.0–15.0)
Total Lymphocyte: 27.3 %
WBC: 7.2 10*3/uL (ref 3.8–10.8)

## 2022-12-23 LAB — LIPID PANEL
Cholesterol: 207 mg/dL — ABNORMAL HIGH (ref <200)
HDL: 63 mg/dL (ref >=50)
LDL Cholesterol (Calc): 122 mg/dL — ABNORMAL HIGH
Non-HDL Cholesterol (Calc): 144 mg/dL — ABNORMAL HIGH (ref <130)
Total CHOL/HDL Ratio: 3.3 (calc) (ref <5.0)
Triglycerides: 114 mg/dL (ref <150)

## 2022-12-23 LAB — HEMOGLOBIN A1C
Hgb A1c MFr Bld: 5.3 %{Hb} (ref <5.7)
Mean Plasma Glucose: 105 mg/dL
eAG (mmol/L): 5.8 mmol/L

## 2022-12-23 LAB — COMPLETE METABOLIC PANEL WITH GFR
AG Ratio: 1.6 (calc) (ref 1.0–2.5)
ALT: 15 U/L (ref 6–29)
AST: 17 U/L (ref 10–35)
Albumin: 4.5 g/dL (ref 3.6–5.1)
Alkaline phosphatase (APISO): 104 U/L (ref 37–153)
BUN: 19 mg/dL (ref 7–25)
CO2: 30 mmol/L (ref 20–32)
Calcium: 9.5 mg/dL (ref 8.6–10.4)
Chloride: 104 mmol/L (ref 98–110)
Creat: 0.67 mg/dL (ref 0.50–1.03)
Globulin: 2.8 g/dL (ref 1.9–3.7)
Glucose, Bld: 80 mg/dL (ref 65–99)
Potassium: 4.4 mmol/L (ref 3.5–5.3)
Sodium: 138 mmol/L (ref 135–146)
Total Bilirubin: 0.5 mg/dL (ref 0.2–1.2)
Total Protein: 7.3 g/dL (ref 6.1–8.1)
eGFR: 105 mL/min/{1.73_m2} (ref >=60)

## 2022-12-23 LAB — THYROID PANEL WITH TSH
Free Thyroxine Index: 1.9 (ref 1.4–3.8)
T3 Uptake: 32 % (ref 22–35)
T4, Total: 6 ug/dL (ref 5.1–11.9)
TSH: 1.24 m[IU]/L

## 2023-02-04 DIAGNOSIS — Z01419 Encounter for gynecological examination (general) (routine) without abnormal findings: Secondary | ICD-10-CM | POA: Diagnosis not present

## 2023-02-04 DIAGNOSIS — Z1231 Encounter for screening mammogram for malignant neoplasm of breast: Secondary | ICD-10-CM | POA: Diagnosis not present

## 2023-02-04 LAB — HM MAMMOGRAPHY

## 2023-04-01 ENCOUNTER — Ambulatory Visit: Payer: BC Managed Care – PPO

## 2023-04-01 NOTE — Progress Notes (Signed)
 Orthotics   Patient was present and evaluated for Custom molded foot orthotics. Patient will benefit from CFO's to provide total contact to BIL MLA's helping to balance and distribute body weight more evenly across BIL feet helping to reduce plantar pressure and pain. Orthotic will also encourage FF / RF alignment  Patient was scanned today and will return for fitting upon receipt  Addison Bailey Cped, CFo, CFm

## 2023-04-19 DIAGNOSIS — N941 Unspecified dyspareunia: Secondary | ICD-10-CM | POA: Diagnosis not present

## 2023-04-19 DIAGNOSIS — N951 Menopausal and female climacteric states: Secondary | ICD-10-CM | POA: Diagnosis not present

## 2023-04-29 DIAGNOSIS — M13861 Other specified arthritis, right knee: Secondary | ICD-10-CM | POA: Diagnosis not present

## 2023-05-13 ENCOUNTER — Ambulatory Visit (INDEPENDENT_AMBULATORY_CARE_PROVIDER_SITE_OTHER)

## 2023-05-13 DIAGNOSIS — M2141 Flat foot [pes planus] (acquired), right foot: Secondary | ICD-10-CM

## 2023-05-13 DIAGNOSIS — M722 Plantar fascial fibromatosis: Secondary | ICD-10-CM

## 2023-05-14 NOTE — Progress Notes (Signed)
 Patient presents today to pick up custom molded foot orthotics, diagnosed with PF by Dr. Logan Bores.   Orthotics were dispensed and fit was satisfactory. Reviewed instructions for break-in and wear. Written instructions given to patient.  Patient will follow up as needed.   [name] Cped, CFo, CFm

## 2023-05-28 DIAGNOSIS — M1711 Unilateral primary osteoarthritis, right knee: Secondary | ICD-10-CM | POA: Diagnosis not present

## 2023-07-02 ENCOUNTER — Ambulatory Visit
Admission: RE | Admit: 2023-07-02 | Discharge: 2023-07-02 | Disposition: A | Discharge status: Home / Self Care | Attending: Urology | Admitting: Urology

## 2023-07-02 ENCOUNTER — Encounter: Payer: Self-pay | Admitting: Urology

## 2023-07-02 ENCOUNTER — Ambulatory Visit
Admission: RE | Admit: 2023-07-02 | Discharge: 2023-07-02 | Disposition: A | Discharge status: Home / Self Care | Source: Ambulatory Visit | Attending: Urology | Admitting: Urology

## 2023-07-02 ENCOUNTER — Ambulatory Visit: Payer: Self-pay | Admitting: Urology

## 2023-07-02 VITALS — BP 119/79 | HR 76 | Ht 62.0 in | Wt 180.0 lb

## 2023-07-02 DIAGNOSIS — Z87442 Personal history of urinary calculi: Secondary | ICD-10-CM | POA: Diagnosis not present

## 2023-07-02 DIAGNOSIS — N200 Calculus of kidney: Secondary | ICD-10-CM | POA: Diagnosis not present

## 2023-07-02 DIAGNOSIS — I878 Other specified disorders of veins: Secondary | ICD-10-CM | POA: Diagnosis not present

## 2023-07-02 NOTE — Progress Notes (Signed)
 07/02/2023 9:19 AM   [name] 1970/10/29 [id number]  Referring provider: Sowles, Krichna, MD [address] [place],  [place] [address]  Chief Complaint  Patient presents with   Nephrolithiasis   Urologic history: 1.  Recurrent nephrolithiasis Ureteroscopic removal 5 mm left distal calculus 12/02/2021 CT with a punctate left renal calculus Has passed previous stones x 4 in the past  HPI: [name] is a 53 y.o. female who presents for annual follow-up  Since last year's visit she has noted intermittent  left flank pain which has been rated mild-moderate No bothersome LUTS Denies dysuria, gross hematuria Denies flank, abdominal or pelvic pain    PMH: Past Medical History:  Diagnosis Date   Allergy    Anemia    Childhood   Arthritis    Back pain    bulging disc   Chronic kidney disease    kidney stones   Clinical depression    Eczema    Exercise-induced asthma    Fatigue    Headache    History of 2019 novel coronavirus disease (COVID-19)    a.) 02/03/2019; b.) 10/01/2020   History of chicken pox    History of hiatal hernia    History of kidney stones    History of RSV infection 11/23/2021   a.) Tx'd with 6 day prednisone  taper + promethazine /DM cough syrup   Insomnia    Irregular menses    Ovarian cyst    Over weight    Rectus diastasis    Right hip pain    Umbilical hernia     Surgical History: Past Surgical History:  Procedure Laterality Date   CESAREAN SECTION  2003,2005,2008,2010   COLONOSCOPY WITH PROPOFOL  N/A 06/03/2022   Procedure: COLONOSCOPY WITH PROPOFOL ;  Surgeon: Luke Salaam, MD;  Location: [facility] ENDOSCOPY;  Service: Gastroenterology;  Laterality: N/A;   CYSTOSCOPY/URETEROSCOPY/HOLMIUM LASER/STENT PLACEMENT Left 12/02/2021   Procedure: CYSTOSCOPY/URETEROSCOPY/HOLMIUM LASER/STENT PLACEMENT;  Surgeon: Geraline Knapp, MD;  Location: [facility] ORS;  Service: Urology;  Laterality: Left;   ESOPHAGOGASTRODUODENOSCOPY  (EGD) WITH PROPOFOL  N/A 06/03/2022   Procedure: ESOPHAGOGASTRODUODENOSCOPY (EGD) WITH PROPOFOL ;  Surgeon: Luke Salaam, MD;  Location: [facility] ENDOSCOPY;  Service: Gastroenterology;  Laterality: N/A;   FOOT SURGERY     neuroma left foot   HERNIA REPAIR     abdominoplasty   MEDIAL PARTIAL KNEE REPLACEMENT Left    meniscal repair Left 11/2018   tummy tuck  05/31/2014   UMBILICAL HERNIA REPAIR  05/31/2014   VENTRAL HERNIA REPAIR  05/31/2014   WISDOM TOOTH EXTRACTION     not under sedation    Home Medications:  Allergies as of 07/02/2023       Reactions   Bactrim  [sulfamethoxazole -trimethoprim ] Itching   Celebrex [celecoxib] Hives   Erythromycin Other (See Comments)   Severe abdominal pain   Penicillins Hives        Medication List        Accurate as of July 02, 2023  9:19 AM. If you have any questions, ask your nurse or doctor.          STOP taking these medications    albuterol  108 (90 Base) MCG/ACT inhaler Commonly known as: VENTOLIN  HFA   omeprazole  40 MG capsule Commonly known as: PRILOSEC   Trulance  3 MG Tabs Generic drug: Plecanatide        TAKE these medications    clobetasol ointment 0.05 % Commonly known as: TEMOVATE as needed.   diclofenac 75 MG EC tablet Commonly known as:  VOLTAREN Take 75 mg by mouth 2 (two) times daily.   estradiol 0.025 MG/24HR Commonly known as: VIVELLE-DOT Place 1 patch onto the skin 2 (two) times a week.   progesterone 200 MG capsule Commonly known as: PROMETRIUM Take 200 mg by mouth daily.        Allergies:  Allergies  Allergen Reactions   Bactrim  [Sulfamethoxazole -Trimethoprim ] Itching   Celebrex [Celecoxib] Hives   Erythromycin Other (See Comments)    Severe abdominal pain   Penicillins Hives    Family History: Family History  Problem Relation Age of Onset   Cancer Mother 26       breast cancer   Osteoarthritis Mother    Broken bones Mother        Hip x2   Varicose Veins Mother    Hyperlipidemia  Father    Hypertension Father    Heart attack Father    Dementia Father    Hypertension Sister    Migraines Sister    Thyroid  disease Sister    GI problems Sister        Extra kink in her large intestine   Hypercholesterolemia Brother    Hypercholesterolemia Brother    Colon cancer Maternal Grandmother    Heart disease Maternal Grandfather    Hypertension Paternal Grandmother    Congenital heart disease Paternal Grandmother    Stroke Paternal Grandfather    Kidney disease Daughter     Social History:  reports that she has never smoked. She has never used smokeless tobacco. She reports that she does not drink alcohol and does not use drugs.   Physical Exam: BP 119/79   Pulse 76   Ht 5' 2 (1.575 m)   Wt 180 lb (81.6 kg)   LMP 03/25/2020 (Approximate)   BMI 32.92 kg/m   Constitutional:  Alert and oriented, No acute distress. HEENT: [employer] AT Respiratory: Normal respiratory effort, no increased work of breathing. Psychiatric: Normal mood and affect.   Pertinent Imaging: KUB performed earlier this morning was personally reviewed and interpreted. There is a moderate amount of stool and bowel gas overlying the renal outlines. No calcifications suspicious for urinary tract stones are noted.   Assessment & Plan:    1. Recurrent nephrolithiasis Having intermittent left flank pain KUB negative Schedule RUS to evaluate for interval growth of her previously noted left renal calculus.  Will notify with results   Geraline Knapp, MD  [facility] [address] [place], [place] [address] [phone]

## 2023-07-02 NOTE — Patient Instructions (Signed)
 Please Call [facility] Scheduler at [phone] to schedule Ultrasound

## 2023-07-20 ENCOUNTER — Ambulatory Visit
Admission: RE | Admit: 2023-07-20 | Discharge: 2023-07-20 | Disposition: A | Discharge status: Home / Self Care | Source: Ambulatory Visit | Attending: Urology | Admitting: Urology

## 2023-07-20 DIAGNOSIS — N1330 Unspecified hydronephrosis: Secondary | ICD-10-CM | POA: Diagnosis not present

## 2023-07-20 DIAGNOSIS — R93421 Abnormal radiologic findings on diagnostic imaging of right kidney: Secondary | ICD-10-CM | POA: Diagnosis not present

## 2023-07-20 DIAGNOSIS — N200 Calculus of kidney: Secondary | ICD-10-CM | POA: Insufficient documentation

## 2023-07-22 DIAGNOSIS — M1711 Unilateral primary osteoarthritis, right knee: Secondary | ICD-10-CM | POA: Diagnosis not present

## 2023-07-22 DIAGNOSIS — G8929 Other chronic pain: Secondary | ICD-10-CM | POA: Diagnosis not present

## 2023-07-22 DIAGNOSIS — M25562 Pain in left knee: Secondary | ICD-10-CM | POA: Diagnosis not present

## 2023-07-22 DIAGNOSIS — Z96652 Presence of left artificial knee joint: Secondary | ICD-10-CM | POA: Diagnosis not present

## 2023-07-25 ENCOUNTER — Ambulatory Visit: Payer: Self-pay | Admitting: Urology

## 2023-08-20 DIAGNOSIS — M1711 Unilateral primary osteoarthritis, right knee: Secondary | ICD-10-CM | POA: Diagnosis not present

## 2023-08-25 DIAGNOSIS — M3501 Sicca syndrome with keratoconjunctivitis: Secondary | ICD-10-CM | POA: Diagnosis not present

## 2023-08-31 DIAGNOSIS — M1711 Unilateral primary osteoarthritis, right knee: Secondary | ICD-10-CM | POA: Diagnosis not present

## 2023-08-31 DIAGNOSIS — G8929 Other chronic pain: Secondary | ICD-10-CM | POA: Diagnosis not present

## 2023-08-31 DIAGNOSIS — M25561 Pain in right knee: Secondary | ICD-10-CM | POA: Diagnosis not present

## 2023-09-13 DIAGNOSIS — M1711 Unilateral primary osteoarthritis, right knee: Secondary | ICD-10-CM | POA: Diagnosis not present

## 2023-09-13 DIAGNOSIS — M25561 Pain in right knee: Secondary | ICD-10-CM | POA: Diagnosis not present

## 2023-09-13 DIAGNOSIS — G8929 Other chronic pain: Secondary | ICD-10-CM | POA: Diagnosis not present

## 2023-09-27 DIAGNOSIS — M1711 Unilateral primary osteoarthritis, right knee: Secondary | ICD-10-CM | POA: Diagnosis not present

## 2023-09-27 DIAGNOSIS — M25561 Pain in right knee: Secondary | ICD-10-CM | POA: Diagnosis not present

## 2023-09-27 DIAGNOSIS — G8929 Other chronic pain: Secondary | ICD-10-CM | POA: Diagnosis not present

## 2023-10-25 DIAGNOSIS — M1711 Unilateral primary osteoarthritis, right knee: Secondary | ICD-10-CM | POA: Diagnosis not present

## 2023-10-25 DIAGNOSIS — M25561 Pain in right knee: Secondary | ICD-10-CM | POA: Diagnosis not present

## 2023-10-25 DIAGNOSIS — G8929 Other chronic pain: Secondary | ICD-10-CM | POA: Diagnosis not present

## 2023-11-08 DIAGNOSIS — M1711 Unilateral primary osteoarthritis, right knee: Secondary | ICD-10-CM | POA: Diagnosis not present

## 2023-11-08 DIAGNOSIS — M25561 Pain in right knee: Secondary | ICD-10-CM | POA: Diagnosis not present

## 2023-11-08 DIAGNOSIS — G8929 Other chronic pain: Secondary | ICD-10-CM | POA: Diagnosis not present

## 2023-11-22 DIAGNOSIS — G8929 Other chronic pain: Secondary | ICD-10-CM | POA: Diagnosis not present

## 2023-11-22 DIAGNOSIS — M1711 Unilateral primary osteoarthritis, right knee: Secondary | ICD-10-CM | POA: Diagnosis not present

## 2023-11-22 DIAGNOSIS — M25561 Pain in right knee: Secondary | ICD-10-CM | POA: Diagnosis not present

## 2023-12-09 DIAGNOSIS — D225 Melanocytic nevi of trunk: Secondary | ICD-10-CM | POA: Diagnosis not present

## 2023-12-09 DIAGNOSIS — L2089 Other atopic dermatitis: Secondary | ICD-10-CM | POA: Diagnosis not present

## 2023-12-09 DIAGNOSIS — L812 Freckles: Secondary | ICD-10-CM | POA: Diagnosis not present

## 2023-12-09 DIAGNOSIS — L821 Other seborrheic keratosis: Secondary | ICD-10-CM | POA: Diagnosis not present

## 2023-12-14 DIAGNOSIS — G8929 Other chronic pain: Secondary | ICD-10-CM | POA: Diagnosis not present

## 2023-12-14 DIAGNOSIS — M25561 Pain in right knee: Secondary | ICD-10-CM | POA: Diagnosis not present

## 2023-12-14 DIAGNOSIS — M1711 Unilateral primary osteoarthritis, right knee: Secondary | ICD-10-CM | POA: Diagnosis not present

## 2023-12-24 ENCOUNTER — Ambulatory Visit: Payer: Self-pay | Admitting: Family Medicine

## 2023-12-24 ENCOUNTER — Encounter: Payer: Self-pay | Admitting: Family Medicine

## 2023-12-24 VITALS — BP 110/72 | HR 79 | Resp 16 | Ht 62.0 in | Wt 180.2 lb

## 2023-12-24 DIAGNOSIS — Z0001 Encounter for general adult medical examination with abnormal findings: Secondary | ICD-10-CM | POA: Diagnosis not present

## 2023-12-24 DIAGNOSIS — Z8262 Family history of osteoporosis: Secondary | ICD-10-CM

## 2023-12-24 DIAGNOSIS — Z1159 Encounter for screening for other viral diseases: Secondary | ICD-10-CM | POA: Diagnosis not present

## 2023-12-24 DIAGNOSIS — Z13220 Encounter for screening for lipoid disorders: Secondary | ICD-10-CM | POA: Diagnosis not present

## 2023-12-24 DIAGNOSIS — Z1231 Encounter for screening mammogram for malignant neoplasm of breast: Secondary | ICD-10-CM

## 2023-12-24 DIAGNOSIS — Z131 Encounter for screening for diabetes mellitus: Secondary | ICD-10-CM

## 2023-12-24 DIAGNOSIS — Z23 Encounter for immunization: Secondary | ICD-10-CM

## 2023-12-24 DIAGNOSIS — Z0000 Encounter for general adult medical examination without abnormal findings: Secondary | ICD-10-CM | POA: Diagnosis not present

## 2023-12-24 NOTE — Progress Notes (Signed)
 Name: [name]   MRN: [id number]    DOB: Oct 07, 1970   Date:12/24/2023       Progress Note  Subjective  Chief Complaint  Chief Complaint  Patient presents with   Annual Exam    HPI  Patient presents for annual CPE.  Discussed the use of AI scribe software for clinical note transcription with the patient, who gave verbal consent to proceed.  History of Present Illness [name] is a 53 year old female who presents for an annual physical exam and follow-up on hormone replacement therapy.  Her weight has remained stable over the past year despite regular exercise and healthy eating habits. She exercises four to five days a week, including walking, biking, playing tennis, and weight lifting. She is frustrated with her inability to lose weight despite these efforts. Her thyroid  panel was completed in December and returned normal results. Her lipid profile shows an HDL of 63 and LDL of 122. Her A1c is 5.3, and her liver and kidney function tests were reported as normal in her prior labs.  She started hormone replacement therapy with progesterone 200 mg at night in January to address vaginal dryness, which has improved significantly. However, her hair loss initially improved but has since returned. No changes in sleep, as she did not have sleep issues prior to starting the therapy.  Her family history is significant for late-onset Alzheimer's disease, with her father and paternal aunt both having been diagnosed. Her father passed away at 55 years old in September 2024, following a decline after contracting COVID-19. Her paternal aunt also passed away shortly after, at 51 years old, following a similar trajectory. She expressed concern about her own risk for dementia due to her family history.  She reports a family history of osteoporosis, with her mother and two sisters having low bone density. She is interested in having a bone density test due to this family history. She takes  vitamin D  supplements and consumes a diet with adequate calcium, including milk, yogurt, and cheese.  She experiences dryness in her right eye, which she manages with over-the-counter wetting drops. No significant impact on her vision, which remains at 20/20.  She mentions a perceived decline in her hearing, although a recent screening was normal. She sometimes needs to ask people to repeat themselves, which she attributes to not paying full attention rather than a hearing deficit.      Diet: balanced  Exercise: continue regular activity   Last Eye Exam: completed Last Dental Exam: completed  Flowsheet Row Office Visit from 12/22/2022 in [facility]  AUDIT-C Score 1   Depression: Phq 9 is  negative    12/24/2023    8:04 AM 12/22/2022    1:56 PM 12/16/2021    2:06 PM 12/16/2021    2:05 PM 09/02/2020    8:16 AM  Depression screen PHQ 2/9  Decreased Interest 0 0 0 0 0  Down, Depressed, Hopeless 0 0 0 0 0  PHQ - 2 Score 0 0 0 0 0  Altered sleeping  0 0  0  Tired, decreased energy  0 0  0  Change in appetite  0 0  0  Feeling bad or failure about yourself   0 0  0  Trouble concentrating  0 0  0  Moving slowly or fidgety/restless  0 0  0  Suicidal thoughts  0 0  0  PHQ-9 Score  0  0   0  Difficult doing work/chores  Not difficult at all        Data saved with a previous flowsheet row definition   Hypertension: BP Readings from Last 3 Encounters:  12/24/23 110/72  07/02/23 119/79  12/22/22 122/80   Obesity: Wt Readings from Last 3 Encounters:  12/24/23 180 lb 3.2 oz (81.7 kg)  07/02/23 180 lb (81.6 kg)  12/22/22 186 lb 3.2 oz (84.5 kg)   BMI Readings from Last 3 Encounters:  12/24/23 32.96 kg/m  07/02/23 32.92 kg/m  12/22/22 34.06 kg/m     Vaccines: reviewed with the patient.   Hep C Screening: completed STD testing and prevention (HIV/chl/gon/syphilis): N/A Intimate partner violence: negative screen  Sexual History :one partner,  vaginal dryness improved with HRT  Menstrual History/LMP/Abnormal Bleeding: post menopausal  Discussed importance of follow up if any post-menopausal bleeding: yes  Incontinence Symptoms: negative for symptoms   Breast cancer:  - Last Mammogram: up to date at gyn  - BRCA gene screening: N/A  Osteoporosis Prevention : Discussed high calcium and vitamin D  supplementation, weight bearing exercises Bone density :yes   Cervical cancer screening: up-to-date  Skin cancer: Discussed monitoring for atypical lesions  Colorectal cancer: repeat in 2023   Lung cancer:  Low Dose CT Chest recommended if Age 60-80 years, 20 pack-year currently smoking OR have quit w/in 15years. Patient does not qualify for screen   ECG: 2020  Advanced Care Planning: A voluntary discussion about advance care planning including the explanation and discussion of advance directives.  Discussed health care proxy and Living will, and the patient was able to identify a health care proxy as  husband .  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/07/2019   Menopausal symptom 06/17/2017   Irregular periods 06/17/2017   Dysmenorrhea 06/17/2017   Vitamin D  deficiency 12/04/2016   Plantar fasciitis, left 05/19/2016   Cervical high risk HPV (human papillomavirus) test positive 11/04/2015   Allergic rhinitis 10/10/2014   Back pain, chronic 10/10/2014   Dermatitis, eczematoid 10/10/2014   Insomnia 10/10/2014   H/O allergy to multiple drugs 02/22/2012   Blood type, Rh negative 02/22/2012   Previous cesarean delivery, delivered 02/22/2012   Personal history of kidney stones  02/22/2012    Past Surgical History:  Procedure Laterality Date   CESAREAN SECTION  2003,2005,2008,2010   COLONOSCOPY WITH PROPOFOL  N/A 06/03/2022   Procedure: COLONOSCOPY WITH PROPOFOL ;  Surgeon: Therisa Bi, MD;  Location: [facility] ENDOSCOPY;  Service: Gastroenterology;  Laterality: N/A;   CYSTOSCOPY/URETEROSCOPY/HOLMIUM LASER/STENT PLACEMENT Left 12/02/2021   Procedure: CYSTOSCOPY/URETEROSCOPY/HOLMIUM LASER/STENT PLACEMENT;  Surgeon: Twylla Glendia BROCKS, MD;  Location: [facility] ORS;  Service: Urology;  Laterality: Left;   ESOPHAGOGASTRODUODENOSCOPY (EGD) WITH PROPOFOL  N/A 06/03/2022   Procedure: ESOPHAGOGASTRODUODENOSCOPY (EGD) WITH PROPOFOL ;  Surgeon: Therisa Bi, MD;  Location: [facility] ENDOSCOPY;  Service: Gastroenterology;  Laterality: N/A;   FOOT SURGERY     neuroma left foot   HERNIA REPAIR     abdominoplasty   MEDIAL PARTIAL KNEE REPLACEMENT Left    meniscal repair Left 11/2018   tummy tuck  05/31/2014   UMBILICAL HERNIA REPAIR  05/31/2014   VENTRAL HERNIA REPAIR  05/31/2014   WISDOM TOOTH EXTRACTION     not under sedation    Family History  Problem Relation Age of Onset   Cancer Mother 70       breast cancer   Osteoarthritis Mother    Broken bones Mother        Hip x2   Varicose Veins Mother    Hyperlipidemia Father    Hypertension Father    Heart attack Father    Dementia Father    Hypertension Sister    Migraines Sister    Thyroid  disease Sister    GI problems Sister        Extra kink in her large intestine   Hypercholesterolemia Brother    Hypercholesterolemia Brother    Colon cancer Maternal Grandmother    Heart disease Maternal Grandfather    Hypertension Paternal Grandmother    Congenital heart disease Paternal Grandmother    Stroke Paternal Grandfather    Kidney disease Daughter     Social History   Socioeconomic History   Marital status: Married    Spouse name: Koren   Number of children: 4   Years of education: Not on file   Highest education level:  Doctorate  Occupational History   Occupation: Professor    Associate Professor: [employer]    Comment: [place]  Tobacco Use   Smoking status: Never   Smokeless tobacco: Never  Vaping Use   Vaping status: Never Used  Substance and Sexual Activity   Alcohol use: No   Drug use: No   Sexual activity: Yes    Partners: Male    Birth control/protection: Rhythm    Comment: married   Other Topics Concern   Not on file  Social History Narrative   Married, she has 4 daughters    She works as network engineer at [employer], and is on research scientist (medical) role and teaching   Social Drivers of Corporate Investment Banker Strain: Low Risk  (07/18/2023)   Received from [facility]   Overall Financial Resource Strain (CARDIA)    Difficulty of Paying Living Expenses: Not hard at all  Food Insecurity: No Food Insecurity (07/18/2023)   Received from [facility]   Hunger Vital Sign    Within the past 12 months, you worried that your food would run out before you got the money to buy more.: Never true    Within the past 12 months, the food you bought just didn't last and you didn't have money to get more.: Never true  Transportation Needs: No Transportation Needs (07/18/2023)   Received from [facility] - Transportation    In the past 12 months, has lack of transportation kept you from medical appointments or from getting medications?: No    Lack of Transportation (Non-Medical): No  Physical Activity: Sufficiently Active (12/22/2022)   Exercise Vital Sign    Days of Exercise per Week: 5 days    Minutes of Exercise per Session: 40 min  Stress: No Stress Concern Present (12/22/2022)   [employer] of Occupational Health - Occupational Stress Questionnaire    Feeling of Stress : Not at all  Social Connections: Moderately Integrated (12/22/2022)   Social Connection and Isolation Panel    Frequency of Communication with Friends and Family: More than three times  a week    Frequency of Social Gatherings with Friends and Family: More than three times a week    Attends Religious Services: More than 4  times per year    Active Member of Clubs or Organizations: No    Attends Banker Meetings: Never    Marital Status: Married  Catering Manager Violence: Not At Risk (12/22/2022)   Humiliation, Afraid, Rape, and Kick questionnaire    Fear of Current or Ex-Partner: No    Emotionally Abused: No    Physically Abused: No    Sexually Abused: No     Current Outpatient Medications:    clobetasol ointment (TEMOVATE) 0.05 %, as needed., Disp: , Rfl: 4   diclofenac (VOLTAREN) 75 MG EC tablet, Take 75 mg by mouth 2 (two) times daily., Disp: , Rfl:    estradiol (VIVELLE-DOT) 0.025 MG/24HR, Place 1 patch onto the skin 2 (two) times a week., Disp: , Rfl:    progesterone (PROMETRIUM) 200 MG capsule, Take 200 mg by mouth daily., Disp: , Rfl:   Allergies  Allergen Reactions   Bactrim  [Sulfamethoxazole -Trimethoprim ] Itching   Celebrex [Celecoxib] Hives   Erythromycin Other (See Comments)    Severe abdominal pain   Penicillins Hives     ROS  Constitutional: Negative for fever or weight change.  Respiratory: Negative for cough and shortness of breath.   Cardiovascular: Negative for chest pain or palpitations.  Gastrointestinal: Negative for abdominal pain, no bowel changes.  Musculoskeletal: Negative for gait problem or joint swelling.  Skin: Negative for rash.  Neurological: Negative for dizziness or headache.  No other specific complaints in a complete review of systems (except as listed in HPI above).   Objective  Vitals:   12/24/23 0809  BP: 110/72  Pulse: 79  Resp: 16  SpO2: 97%  Weight: 180 lb 3.2 oz (81.7 kg)  Height: 5' 2 (1.575 m)    Body mass index is 32.96 kg/m.  Physical Exam  Constitutional: Patient appears well-developed and well-nourished. No distress.  HENT: Head: Normocephalic and atraumatic. Ears: B TMs ok, no  erythema or effusion; Nose: Nose normal. Mouth/Throat: Oropharynx is clear and moist. No oropharyngeal exudate.  Eyes: Conjunctivae and EOM are normal. Pupils are equal, round, and reactive to light. No scleral icterus.  Neck: Normal range of motion. Neck supple. No JVD present. No thyromegaly present.  Cardiovascular: Normal rate, regular rhythm and normal heart sounds.  No murmur heard. No BLE edema. Pulmonary/Chest: Effort normal and breath sounds normal. No respiratory distress. Abdominal: Soft. Bowel sounds are normal, no distension. There is no tenderness. no masses Breast: no t done  FEMALE GENITALIA:  Not done  RECTAL: not done  Musculoskeletal: Normal range of motion, no joint effusions. No gross deformities Neurological: he is alert and oriented to person, place, and time. No cranial nerve deficit. Coordination, balance, strength, speech and gait are normal.  Skin: Skin is warm and dry. No rash noted. No erythema.  Psychiatric: Patient has a normal mood and affect. behavior is normal. Judgment and thought content normal.       Assessment & Plan Adult Wellness Visit Routine wellness visit with stable health. Family history of osteoporosis and dementia noted. Normal hearing. No alcohol use. Maintains regular exercise and healthy diet. - Ordered lipid panel, A1c, glucose, liver function tests, and anemia screening. - Administered pneumonia vaccine. - Discussed bone density screening options and future order for bone density test. - Continue regular exercise and healthy diet. - Encouraged use of sunglasses for eye protection. - PCV 20 today   Menopausal symptoms managed with hormone replacement therapy Menopausal symptoms improved with progesterone. Persistent hair loss. No sleep issues. Discussed long-term  hormone therapy benefits and plan for gradual tapering after ten years. - Continue progesterone 200 mg at night. - Discussed long-term hormone replacement therapy plan,  including potential tapering after ten years.  Right eye dryness Intermittent right eye dryness managed with wetting drops. No vision impact. - Continue using over-the-counter wetting drops as needed. - Advised wearing sunglasses for eye protection.   -USPSTF grade A and B recommendations reviewed with patient; age-appropriate recommendations, preventive care, screening tests, etc discussed and encouraged; healthy living encouraged; see AVS for patient education given to patient -Discussed importance of 150 minutes of physical activity weekly, eat two servings of fish weekly, eat one serving of tree nuts ( cashews, pistachios, pecans, almonds.SABRA) every other day, eat 6 servings of fruit/vegetables daily and drink plenty of water  and avoid sweet beverages.   -Reviewed Health Maintenance: Yes.

## 2023-12-24 NOTE — Patient Instructions (Signed)
 Preventive Care 53-53 Years Old, Female  Preventive care refers to lifestyle choices and visits with your health care provider that can promote health and wellness. Preventive care visits are also called wellness exams.  What can I expect for my preventive care visit?  Counseling  Your health care provider may ask you questions about your:  Medical history, including:  Past medical problems.  Family medical history.  Pregnancy history.  Current health, including:  Menstrual cycle.  Method of birth control.  Emotional well-being.  Home life and relationship well-being.  Sexual activity and sexual health.  Lifestyle, including:  Alcohol, nicotine or tobacco, and drug use.  Access to firearms.  Diet, exercise, and sleep habits.  Work and work Astron[employer].  Sunscreen use.  Safety issues such as seatbelt and bike helmet use.  Physical exam  Your health care provider will check your:  Height and weight. These may be used to calculate your BMI (body mass index). BMI is a measurement that tells if you are at a healthy weight.  Waist circumference. This measures the distance around your waistline. This measurement also tells if you are at a healthy weight and may help predict your risk of certain diseases, such as type 2 diabetes and high blood pressure.  Heart rate and blood pressure.  Body temperature.  Skin for abnormal spots.  What immunizations do I need?    Vaccines are usually given at various ages, according to a schedule. Your health care provider will recommend vaccines for you based on your age, medical history, and lifestyle or other factors, such as travel or where you work.  What tests do I need?  Screening  Your health care provider may recommend screening tests for certain conditions. This may include:  Lipid and cholesterol levels.  Diabetes screening. This is done by checking your blood sugar (glucose) after you have not eaten for a while (fasting).  Pelvic exam and Pap test.  Hepatitis B test.  Hepatitis C  test.  HIV (human immunodeficiency virus) test.  STI (sexually transmitted infection) testing, if you are at risk.  Lung cancer screening.  Colorectal cancer screening.  Mammogram. Talk with your health care provider about when you should start having regular mammograms. This may depend on whether you have a family history of breast cancer.  BRCA-related cancer screening. This may be done if you have a family history of breast, ovarian, tubal, or peritoneal cancers.  Bone density scan. This is done to screen for osteoporosis.  Talk with your health care provider about your test results, treatment options, and if necessary, the need for more tests.  Follow these instructions at home:  Eating and drinking    Eat a diet that includes fresh fruits and vegetables, whole grains, lean protein, and low-fat dairy products.  Take vitamin and mineral supplements as recommended by your health care provider.  Do not drink alcohol if:  Your health care provider tells you not to drink.  You are pregnant, may be pregnant, or are planning to become pregnant.  If you drink alcohol:  Limit how much you have to 0-1 drink a day.  Know how much alcohol is in your drink. In the [place] one drink equals one 12 oz bottle of beer (355 mL), one 5 oz glass of wine (148 mL), or one 1 oz glass of hard liquor (44 mL).  Lifestyle  Brush your teeth every morning and night with fluoride toothpaste. Floss one time each day.  Exercise for at least  30 minutes 5 or more days each week.  Do not use any products that contain nicotine or tobacco. These products include cigarettes, chewing tobacco, and vaping devices, such as e-cigarettes. If you need help quitting, ask your health care provider.  Do not use drugs.  If you are sexually active, practice safe sex. Use a condom or other form of protection to prevent STIs.  If you do not wish to become pregnant, use a form of birth control. If you plan to become pregnant, see your health care provider for a  prepregnancy visit.  Take aspirin only as told by your health care provider. Make sure that you understand how much to take and what form to take. Work with your health care provider to find out whether it is safe and beneficial for you to take aspirin daily.  Find healthy ways to manage stress, such as:  Meditation, yoga, or listening to music.  Journaling.  Talking to a trusted person.  Spending time with friends and family.  Minimize exposure to UV radiation to reduce your risk of skin cancer.  Safety  Always wear your seat belt while driving or riding in a vehicle.  Do not drive:  If you have been drinking alcohol. Do not ride with someone who has been drinking.  When you are tired or distracted.  While texting.  If you have been using any mind-altering substances or drugs.  Wear a helmet and other protective equipment during sports activities.  If you have firearms in your house, make sure you follow all gun safety procedures.  Seek help if you have been physically or sexually abused.  What's next?  Visit your health care provider once a year for an annual wellness visit.  Ask your health care provider how often you should have your eyes and teeth checked.  Stay up to date on all vaccines.  This information is not intended to replace advice given to you by your health care provider. Make sure you discuss any questions you have with your health care provider.  Document Revised: 07/03/2020 Document Reviewed: 07/03/2020  [employer]vier Patient Education  2024 [employer].

## 2023-12-25 LAB — LIPID PANEL
Cholesterol: 173 mg/dL (ref <200)
HDL: 54 mg/dL (ref >=50)
LDL Cholesterol (Calc): 98 mg/dL
Non-HDL Cholesterol (Calc): 119 mg/dL (ref <130)
Total CHOL/HDL Ratio: 3.2 (calc) (ref <5.0)
Triglycerides: 113 mg/dL (ref <150)

## 2023-12-25 LAB — COMPREHENSIVE METABOLIC PANEL WITH GFR
AG Ratio: 1.7 (calc) (ref 1.0–2.5)
ALT: 12 U/L (ref 6–29)
AST: 16 U/L (ref 10–35)
Albumin: 4.3 g/dL (ref 3.6–5.1)
Alkaline phosphatase (APISO): 77 U/L (ref 37–153)
BUN: 16 mg/dL (ref 7–25)
CO2: 27 mmol/L (ref 20–32)
Calcium: 9 mg/dL (ref 8.6–10.4)
Chloride: 105 mmol/L (ref 98–110)
Creat: 0.71 mg/dL (ref 0.50–1.03)
Globulin: 2.5 g/dL (ref 1.9–3.7)
Glucose, Bld: 89 mg/dL (ref 65–99)
Potassium: 4.5 mmol/L (ref 3.5–5.3)
Sodium: 139 mmol/L (ref 135–146)
Total Bilirubin: 0.6 mg/dL (ref 0.2–1.2)
Total Protein: 6.8 g/dL (ref 6.1–8.1)
eGFR: 102 mL/min/1.73m2 (ref >=60)

## 2023-12-25 LAB — CBC WITH DIFFERENTIAL/PLATELET
Absolute Lymphocytes: 1735 {cells}/uL (ref 850–3900)
Absolute Monocytes: 468 {cells}/uL (ref 200–950)
Basophils Absolute: 29 {cells}/uL (ref 0–200)
Basophils Relative: 0.4 %
Eosinophils Absolute: 180 {cells}/uL (ref 15–500)
Eosinophils Relative: 2.5 %
HCT: 42.4 % (ref 35.9–46.0)
Hemoglobin: 14.2 g/dL (ref 11.7–15.5)
MCH: 30.3 pg (ref 27.0–33.0)
MCHC: 33.5 g/dL (ref 31.6–35.4)
MCV: 90.4 fL (ref 81.4–101.7)
MPV: 10.6 fL (ref 7.5–12.5)
Monocytes Relative: 6.5 %
Neutro Abs: 4788 {cells}/uL (ref 1500–7800)
Neutrophils Relative %: 66.5 %
Platelets: 259 Thousand/uL (ref 140–400)
RBC: 4.69 Million/uL (ref 3.80–5.10)
RDW: 12.4 % (ref 11.0–15.0)
Total Lymphocyte: 24.1 %
WBC: 7.2 Thousand/uL (ref 3.8–10.8)

## 2023-12-25 LAB — HEPATITIS B SURFACE ANTIBODY,QUALITATIVE: Hep B S Ab: REACTIVE — AB

## 2023-12-25 LAB — HEMOGLOBIN A1C
Hgb A1c MFr Bld: 5.2 % (ref <5.7)
Mean Plasma Glucose: 103 mg/dL
eAG (mmol/L): 5.7 mmol/L

## 2023-12-27 ENCOUNTER — Ambulatory Visit: Payer: Self-pay | Admitting: Family Medicine

## 2024-02-08 ENCOUNTER — Ambulatory Visit
Admission: RE | Admit: 2024-02-08 | Discharge: 2024-02-08 | Disposition: A | Discharge status: Home / Self Care | Source: Ambulatory Visit | Attending: Family Medicine | Admitting: Family Medicine

## 2024-02-08 ENCOUNTER — Ambulatory Visit
Admission: RE | Admit: 2024-02-08 | Discharge: 2024-02-08 | Disposition: A | Source: Ambulatory Visit | Attending: Family Medicine | Admitting: Family Medicine

## 2024-02-08 DIAGNOSIS — Z8262 Family history of osteoporosis: Secondary | ICD-10-CM | POA: Insufficient documentation

## 2024-02-08 DIAGNOSIS — Z1231 Encounter for screening mammogram for malignant neoplasm of breast: Secondary | ICD-10-CM

## 2024-12-27 ENCOUNTER — Encounter: Admitting: Family Medicine
